# Patient Record
Sex: Female | Born: 1960 | Race: Black or African American | Hispanic: No | Marital: Married | Smoking: Never smoker
Health system: Southern US, Community
[De-identification: ages and names within clinical notes are randomized; demographics above are authoritative.]

## PROBLEM LIST (undated history)

## (undated) DIAGNOSIS — IMO0002 Reserved for concepts with insufficient information to code with codable children: Secondary | ICD-10-CM

## (undated) DIAGNOSIS — M797 Fibromyalgia: Secondary | ICD-10-CM

## (undated) DIAGNOSIS — F32A Depression, unspecified: Secondary | ICD-10-CM

## (undated) DIAGNOSIS — F419 Anxiety disorder, unspecified: Secondary | ICD-10-CM

## (undated) DIAGNOSIS — I219 Acute myocardial infarction, unspecified: Secondary | ICD-10-CM

## (undated) DIAGNOSIS — I10 Essential (primary) hypertension: Secondary | ICD-10-CM

## (undated) DIAGNOSIS — M329 Systemic lupus erythematosus, unspecified: Secondary | ICD-10-CM

## (undated) DIAGNOSIS — G629 Polyneuropathy, unspecified: Secondary | ICD-10-CM

## (undated) DIAGNOSIS — F329 Major depressive disorder, single episode, unspecified: Secondary | ICD-10-CM

## (undated) HISTORY — PX: UTERINE FIBROID SURGERY: SHX826

## (undated) HISTORY — PX: ABDOMINAL EXPLORATION SURGERY: SHX538

---

## 2006-12-22 ENCOUNTER — Ambulatory Visit: Payer: Self-pay | Admitting: Physical Medicine & Rehabilitation

## 2007-01-05 ENCOUNTER — Encounter: Admission: RE | Admit: 2007-01-05 | Discharge: 2007-01-05 | Payer: Self-pay | Admitting: Orthopedic Surgery

## 2007-01-18 ENCOUNTER — Encounter
Admission: RE | Admit: 2007-01-18 | Discharge: 2007-01-18 | Payer: Self-pay | Admitting: Physical Medicine & Rehabilitation

## 2007-02-02 ENCOUNTER — Ambulatory Visit: Payer: Self-pay | Admitting: Physical Medicine & Rehabilitation

## 2007-02-07 ENCOUNTER — Encounter
Admission: RE | Admit: 2007-02-07 | Discharge: 2007-02-07 | Payer: Self-pay | Admitting: Physical Medicine & Rehabilitation

## 2007-02-23 ENCOUNTER — Encounter
Admission: RE | Admit: 2007-02-23 | Discharge: 2007-02-23 | Payer: Self-pay | Admitting: Physical Medicine & Rehabilitation

## 2007-03-01 ENCOUNTER — Encounter
Admission: RE | Admit: 2007-03-01 | Discharge: 2007-03-01 | Payer: Self-pay | Admitting: Physical Medicine & Rehabilitation

## 2007-04-27 ENCOUNTER — Ambulatory Visit: Payer: Self-pay | Admitting: Physical Medicine & Rehabilitation

## 2007-05-02 ENCOUNTER — Ambulatory Visit: Payer: Self-pay | Admitting: Pulmonary Disease

## 2007-05-02 DIAGNOSIS — G4733 Obstructive sleep apnea (adult) (pediatric): Secondary | ICD-10-CM

## 2007-06-06 ENCOUNTER — Ambulatory Visit: Payer: Self-pay | Admitting: Cardiology

## 2007-06-13 ENCOUNTER — Encounter
Admission: RE | Admit: 2007-06-13 | Discharge: 2007-06-13 | Payer: Self-pay | Admitting: Physical Medicine & Rehabilitation

## 2007-06-15 ENCOUNTER — Ambulatory Visit: Payer: Self-pay | Admitting: Physical Medicine & Rehabilitation

## 2007-06-19 ENCOUNTER — Ambulatory Visit: Payer: Self-pay | Admitting: Cardiovascular Disease

## 2007-07-20 ENCOUNTER — Ambulatory Visit: Payer: Self-pay | Admitting: Physical Medicine & Rehabilitation

## 2007-08-31 ENCOUNTER — Ambulatory Visit: Payer: Self-pay | Admitting: Physical Medicine & Rehabilitation

## 2007-09-28 ENCOUNTER — Ambulatory Visit: Payer: Self-pay | Admitting: Physical Medicine & Rehabilitation

## 2007-10-31 ENCOUNTER — Ambulatory Visit: Payer: Self-pay

## 2007-11-02 ENCOUNTER — Ambulatory Visit: Payer: Self-pay | Admitting: Physical Medicine & Rehabilitation

## 2007-11-16 ENCOUNTER — Ambulatory Visit: Payer: Self-pay | Admitting: Physical Medicine & Rehabilitation

## 2008-04-28 DIAGNOSIS — K219 Gastro-esophageal reflux disease without esophagitis: Secondary | ICD-10-CM

## 2008-04-28 DIAGNOSIS — F329 Major depressive disorder, single episode, unspecified: Secondary | ICD-10-CM

## 2008-04-28 DIAGNOSIS — I10 Essential (primary) hypertension: Secondary | ICD-10-CM | POA: Insufficient documentation

## 2008-04-28 DIAGNOSIS — M1710 Unilateral primary osteoarthritis, unspecified knee: Secondary | ICD-10-CM | POA: Insufficient documentation

## 2008-04-28 DIAGNOSIS — J45909 Unspecified asthma, uncomplicated: Secondary | ICD-10-CM | POA: Insufficient documentation

## 2008-04-28 DIAGNOSIS — E119 Type 2 diabetes mellitus without complications: Secondary | ICD-10-CM

## 2008-04-28 DIAGNOSIS — M479 Spondylosis, unspecified: Secondary | ICD-10-CM | POA: Insufficient documentation

## 2008-04-28 DIAGNOSIS — IMO0001 Reserved for inherently not codable concepts without codable children: Secondary | ICD-10-CM | POA: Insufficient documentation

## 2010-09-07 NOTE — Assessment & Plan Note (Signed)
Judy English returns today.  She is a 50 year old female with  fibromyalgia syndrome, severe bilateral knee osteoarthritis, and morbid  obesity.  She is not a surgical candidate in terms of her knees.  She  has paresthesias in arms.  EMG is showing no neuropathy.  Cervical MRI  shows no signs of significant foraminal stenosis.  She is trying to  loose weight and has seen a dietitian.  We did switch her from Neurontin  to Topamax, in terms of pain relief, about the same.  She has not lost  any weight on this, which was hoping to be a beneficial side effect.   Her pain level is about 9/10, feels all over her body.  Her entire pain  diagram is filled in.  Pain is consistent throughout the day.  Relief  from meds is listed as good.  She uses a cane all the time, sometimes  uses a walker.  She cannot walk very far distances, although she does  make it from the parking lot to the office here, which is over 100 feet.   REVIEW OF SYSTEMS:  Basically, positive in the GI, neuropsych, and  cardiorespiratory realms.  Please see Health and History form.   PHYSICAL EXAMINATION:  Blood pressure 166/97; she states that she will  go see her primary and get it rechecked.  Pulse 95 and weight 323  pounds.  Mood and affect are appropriate.  She has 18/18 fibromyalgic  tender points.  Knee showed no evidence of effusion.  Gait is slow.  She  has a valgus deformity, bilateral knees.  She has widened base of  support, slow waddling type gait.   IMPRESSION:  1. Fibromyalgia syndrome, fairly well controlled on current      medications.  We will increase her Topamax dosing to see if we can      impact on some of her fibromyalgia syndrome.  2. Continue hydrocodone 7.5/325 mg t.i.d.  3. I will see her back in 1 month, by that time she should be on 100      mg b.i.d. of the Topamax.      Erick Colace, M.D.  Electronically Signed     AEK/MedQ  D:  09/28/2007 10:30:59  T:  09/29/2007 00:40:44  Job  #:  [id number]   cc:   Seymour Bars, D.O.  [facility].  [address]  [place], [place] [address]

## 2010-09-07 NOTE — Assessment & Plan Note (Signed)
Ms. Boyko comes back today.  She is actually in good spirits.  She  did talk to Dr. Charlann Boxer who thought she needed to drop down to 250 pounds  in order to be considered for knee replacement.  Her current weight is  336.  She does note that she used to weigh about 400, and is under the  care of Dr. Larita Fife,  lost about 70 pounds with diet and diabetes control.   She was tried on increased doses of Lyrica, but this really did not  help, and she was dropped back down to b.i.d.   Urine drug screen was appropriate, no evidence of illicit drug use.   CURRENT MEDICATIONS:  1. Hydrocodone 7.5/500 one p.o. b.i.d.  She does not think this is      controlling her pain adequately.  2. Lyrica 150 b.i.d.   PHYSICAL EXAMINATION:  Morbidly obese female in no acute distress.  Her blood pressure is 132/73, pulse 91, weight 336 pounds.  She ambulates with valgus deformity bilateral knees using a cane.  Her back has no tenderness to palpation.  She has good strength in upper  and lower extremities.  She has crepitus bilateral knees.  The patient started complaining of hand numbness and tingling, right  hand greater than left.  She has a negative reverse Phalen's, negative  Tinel's, states that it wakes her up at night.   IMPRESSION:  1. Myofascial pain syndrome.  2. Osteoarthritis, bilateral knees.  3. Wrist pain, dysesthesias, paresthesias.   PLAN:  1. Will set her up for EMG bilateral upper extremities.  2. Increase hydrocodone 10/500 one p.o. b.i.d.  3. Continue Lyrica 150 b.i.d.      Erick Colace, M.D.  Electronically Signed     AEK/MedQ  D:  02/02/2007 12:05:00  T:  02/02/2007 18:36:00  Job #:  [id number]

## 2010-09-07 NOTE — Assessment & Plan Note (Signed)
HISTORY OF PRESENT ILLNESS:  A 50 year old female with fibromyalgia  syndrome as well as severe bilateral knee osteoarthritis and morbid  obesity.  She is not a surgical candidate in terms of her knee.  She has  paresthesias in her arms, but EMG showing no evidence of neuropathy.  Cervical spine MRI shows no evidence of significant foraminal stenosis.  She has had no new changes in the interval time other than gaining 5  pounds in the last month.  She was switched from Neurontin to Topamax to  help with fibromyalgia pain with the hopes of getting the additional  benefit of appetite reduction; however, this has not been the case.  She  states that she has been quite busy after her son's graduation, more  active than usual; uses a cane to ambulate.  Sometimes, uses a walker on  bad day.  She needs assistance with dressing, bathing, meal preparation,  possible duties, and shopping.   REVIEW OF SYSTEMS:  Positive for trouble walking, spasms, dizziness,  confusion, depression, anxiety, numbness, weakness, weight gain, night  sweats, blood sugar problems, diarrhea alternating with constipation,  nausea, shortness of breath, and wheezing.  She just saw her family  physician yesterday.   SOCIAL HISTORY:  Single, lives with her children.  Denies any drug or  alcohol abuse.  There has been no signs of aberrant drug behavior.   PHYSICAL EXAMINATION:  VITAL SIGNS:  Her blood pressure is 166/97, pulse  95, weight 323 pounds.  GENERAL:  No acute distress.  Mood and affect appropriate.  NEUROLOGIC:  Gait shows wide-based gait; valgus deformity bilateral  knees.  Motor strength is 5/5 bilateral lower extremities.  She has  tenderness; fibromyalgia tender points, bilateral low back, hip, elbow,  and knee areas.  No areas in the neck or upper trapezius.   IMPRESSION:  1. Fibromyalgia syndrome.  I believe that she was better-controlled on      the Neurontin 600 t.i.d. than on the Topamax.  Certainly,  she has      had weight gain rather than weight loss, so I do not think there is      any additional benefit of the Topamax in that regard.  2. Bilateral knee degenerative joint disease, exacerbated by her      morbid obesity.  3. Does have an appointment with nutritional counseling.  We will send      her to physical therapy in terms of knee strengthening exercises,      fitness focus program for visits.  4. Cervical spondylosis without evidence of radiculopathy.   I will see her back in about 6 weeks.  She already has some hydrocodone  that should last for at least a couple more weeks, and then we will  renew via fax, and I have written new prescription for Neurontin 600  t.i.d. that she should substitute for the Topamax.      Erick Colace, M.D.  Electronically Signed     AEK/MedQ  D:  11/02/2007 09:59:01  T:  11/03/2007 09:37:27  Job #:  [id number]   cc:   Cyndi Lennert

## 2010-09-07 NOTE — Assessment & Plan Note (Signed)
Judy English follows up today.  I last saw her approximately 2 weeks  ago. She has had some increased problems since that time.  She complains  of diarrhea, some stomach cramps, flu-like feeling, feeling congested,  as well as pain all over the body as well as headache.  She states she  has seen her primary physician and states no new treatment was  initiated.   CURRENT MEDICATIONS:  Current medications that she obtains from my  office hydrocodone 7.5/325 t.i.d. and Neurontin 600 mg t.i.d.   The patient states she is sleeping poorly.  She walks 2 to 3 minutes at  a time.  She uses cane.  Her Oswestry index is 82, which, based on  scoring, is considered either bed-bound or exaggerating symptoms.   REVIEW OF SYSTEMS:  Otherwise negative.  She states that she has a lot  on her mind that she goes to the [employer] for mental health  services.  She declined to elaborate more on this for me.   PHYSICAL EXAMINATION:  GENERAL:  Appears tired, but otherwise in no  acute distress.  Affect is flat.  Gait is wide-based due to body  habitus, some valgus deformity at the knees.  Coordination appears  normal.  EXTREMITIES:  Deep tendon reflexes are normal, sensation is normal, and  motor strength is normal in bilateral upper and lower extremities.  She  has no evidence of peripheral edema.  NECK:  Range of motion is good.  No signs of meningismus.   IMPRESSION:  History of fibromyalgia with all over body pain,  complicated by depression.  She has mild cervical spondylosis, but no  compressive lesions.  She has no focal or neurologic signs to indicate  any CNS-type problem.  I think this mainly represents her fibromyalgia  with some overlying depression and we will increase her Neurontin to 800  t.i.d.   We will continue her hydrocodone 7.5 t.i.d.  I have asked her to follow  with [facility] to see if medication adjustments might be in order.  Check urine drug screen and make sure she  is taking her meds  appropriately.  It is possible that some of this could be that she took  her medicines at a more frequent dosing pattern and she may actually be  in withdrawal for the last few days.  We should be see those on urine  screen.   I also asked her to follow up with Dr. Jaynie Collins in regards to her diarrhea,  stomach cramping, as well as congestion.      Erick Colace, M.D.  Electronically Signed     AEK/MedQ  D:  11/16/2007 11:30:46  T:  11/17/2007 02:30:22  Job #:  [id number]   cc:   Judy Creek, DO

## 2010-09-07 NOTE — Assessment & Plan Note (Signed)
Ms. Hottenstein returns today. She has fibromyalgia with mini OA. She has  been seen by orthopedic and is not a surgical candidate. She has had an  EMG test back on 02/16/2007 demonstrating some chronic neurogenic  process, multilevel increased polyphasics, left APB first DI biceps,  triceps, deltoid. No other abnormalities on EMG testing to suggest media  nor ulnar neuropathy. No evidence of polyneuropathy.   She needs specific meal prep, household duties. She has some confusion,  depression, anxiety, and no suicidal thoughts.   Mood and affect are appropriate.   PHYSICAL EXAMINATION:  VITAL SIGNS:  Weight 336 pounds.  GENERAL:  She had tenderness in all 18 of 18 fibromyalgia tender points.  She has some knee joint line tenderness. She has normal deep tendon  reflexes. No strength deficits in upper or lower extremities.   IMPRESSION:  1. Fibromyalgia syndrome.  2. Polyphagia, fairly diffuse in the left upper extremity, but no      other findings. No signs of myelopathy. She may benefit from an MRI      test to see whether she has some cervical stenosis, but it is      difficult to say how this relates to her current complaints as      well.   I will follow up with her, continue her current medications.   Her medications include:  1. Neurontin 300 mg work up to t.i.d.  2. I stopped her Lyrica 150 due to lack of efficacy.  3. Lidoderm patch her back and bilateral knees.   I will see her back to see how she is doing with her medications.      Erick Colace, M.D.  Electronically Signed     AEK/MedQ  D:  02/23/2007 12:13:44  T:  02/24/2007 01:03:14  Job #:  [id number]

## 2010-09-07 NOTE — Assessment & Plan Note (Signed)
DATE OF SERVICE:  04/27/07   HISTORY:  This is a 50 year old female with whole body pain, onset about  16 months ago, history of major depression versus bipolar disorder seen  at the [facility] both by the psychologist as well as her  psychiatrist, Dr. Tresa Endo.   She has a history of lumbar pain.  A lumbar spine film from about a year  ago shows displaced narrowing at L5-S1.  She has cervical pain as well  and spondylosis is noted at C4-5 through C6-7 area.  No focal disc  herniations noted.  She has had some relief with trigger point injection  to the upper trapezius.  She has had minimally elevated sed rate,  however, rheumatoid factor, urate and ANA were negative.   Her pain levels have been a bit elevated recently.  It feels like the  Hydrocodone at twice a day doesn't cover throughout the day.  Her pain  is 9/10, sleep is poor.  Pain is relieved by rest, heat, medications,  TENS, and trigger point injections.  Mobility - Uses a cane, sometimes  uses a wheelchair in stores.   REVIEW OF SYSTEMS:  Functionally, she needs some assistance with  dressing, bathing, meal prep, household duties, shopping.  Fourteen  point review of systems done as well. Pertinent positives:  Depression/anxiety.   PHYSICAL EXAMINATION:  VITAL SIGNS:  Blood pressure 138/82, pulse 99,  weight 325 pounds which is actually down compared to the prior of 332 on  12/05.  Despite the holidays, I did congratulate her on this.  EXTREMITIES:  Her knees have crepitus but good range of motion.  Her hip  range of motion is good.  Good ankle range of motion.  Lower extremity  strength is normal.  Deep tendon reflexes are normal in the lower  extremities.  She has some tightness in the right upper trap but no  tenderness in the neck or parascapular area.  Mild tenderness on  palpation in the lumbar paraspinal's.   IMPRESSION:  1. Bilateral knee degenerative joint disease.  2. Cervical spondylosis.  3. All over  body pain .  Does not meet criteria for fibromyalgia      syndrome.  She has myofascial pain syndrome effecting mainly the      paraspinal, cervical area, trapezius area.  This is in conjunction      with her cervical spondylosis as well.   PLAN:  1. Will hold off on trigger point injection.  2. Will reduce her Hydrocodone from 10 mg to 7.5 but dose it three      times a day.  3. Increase Neurontin to 400 three times a day.  4. I will see her back in 2 months.  Continue home exercise program      until then.      Erick Colace, M.D.  Electronically Signed     AEK/MedQ  D:  04/27/2007 10:15:10  T:  04/27/2007 10:37:04  Job #:  [id number]   cc:   Dr. Tresa Endo

## 2010-09-07 NOTE — Assessment & Plan Note (Signed)
Judy English returns today.  She has a history of fibromyalgia syndrome  with bilateral knee osteoarthritis.  She is not a surgical candidate for  orthopedics.  Her chief complaint today is bilateral hand pain.  Denies  any numbness or tingling.  Has had EMG of the upper extremities  demonstrating no evidence of ulnar or medial neuropathy.  There were  some signs of chronic radiculopathy.  However, her cervical spine MRI  done lat fall as well, did not demonstrate any significant foraminal  compression.   She has what she terms as swelling in the hands.  I have never observed  this.  She has had rheumatologic blood work with rheumatoid factor of  20, which is normal, ANA, which is negative, urate, which was normal,  but a minimally elevated sed rate at 40.  She is having an evaluation  with rheumatology per her report.   On the positive side, she continues to lose weight.  She has gone down  from a high of 345 pounds, which was measured last fall, and is now down  to 329 pounds.  She has been going to [employer] here in  [place].   Her other complaint today is all over body pain, basically sparing the  chest and the abdomen, as well as the posterior head, but really the  rest of her body is shaded in.  Her average pain is 9/10.  Her pain is  constant, interferes with activity at a 10/10 level.  Pain is worse with  basically every activity.  Improves with rest, medication and TENS.   REVIEW OF SYSTEMS:  14 point review.  Pertinent positives include  depression, anxiety.  She has sleep problems as well.   Her blood pressure is 149/94, pulse 106, weight 329 pounds.  Her gait is  using a cane.  She has valgus deformity bilateral knees, slow  ambulation.   Palpation of fibromyalgia tender points is negative.  Her knees have  crepitus, but good range of motion.  Hip range of motion is good.  Ankle  range of motion is good.  Lower extremity and upper extremity  strength  is normal.  She has normal deep tendon reflexes in the biceps,  brachioradialis.  Triceps difficult to elicit secondary to adipose  tissue in the posterior arm.  Her neck range of motion is good.  She has  negative Spurling's maneuver.  Her sensation is intact.  Her reverse  Phalen's is negative.  She a negative Finkelstein's maneuver and no pain  with stress of the carpal metacarpal joint.   IMPRESSION:  1. All over body pain.  Most likely represents fibromyalgia syndrome,      although she does not have classical physical exam findings of 11      out of 18 tender points.  This may also account for the hand pain.      She is undergoing further rheumatologic evaluation and I would like      a copy of that report as well.  2. Bilateral knee degenerative disk disease.  3. Cervical spondylosis without clinical evidence of radiculopathy.   PLAN:  1. Continue hydrocodone 7.5 t.i.d.  2. Increase Neurontin to 600 t.i.d.  She did fail la previous effort      at Lyrica and Neurontin has been used as well      in clinical studies demonstrating benefit for fibromyalgia      patients.  3. I will see her back in one month's  time.      Erick Colace, M.D.  Electronically Signed     AEK/MedQ  D:  06/15/2007 10:59:16  T:  06/15/2007 21:43:53  Job #:  [id number]   cc:   Sherry Limb, Dr.

## 2010-09-07 NOTE — Assessment & Plan Note (Signed)
[facility]                            CARDIOLOGY OFFICE NOTE   LAEKYN, RAYOS                     MRN:          [id number]  DATE:06/06/2007                            DOB:          04-17-1961    Mrs. Raneri was a 50 year old female with past medical history of  diabetes mellitus, hypertension, fibromyalgia, gastroesophageal reflux  disease who presents as a new patient for evaluation of chest pain and  blood pressure issues.  She has no prior cardiac history.  For the past  several years she has had intermittent chest pain.  The pain is  substernal, described as a pressure.  It does not radiate.  It  can  increase with inspiration and lying flat.  It is not clearly exertional.  It is not related to food.  It typically lasts several minutes and  resolves spontaneously.  There is no associated nausea, vomiting or  diaphoresis, but there is shortness of breath.  She also states that her  diastolic blood pressure has been running between 90 and 100.  Because  of the above we were asked to further evaluate.  Note she does have some  dyspnea on exertion but there is no orthopnea, PND and has no pedal  edema.   MEDICATIONS:  1. Metformin 500 mg tablets two p.o. b.i.d.  2. Exforge 5/160 daily.  3. Prozac 80 mg p.o. b.i.d.  4. Lasix 20 mg daily.  5. Potassium 20 mEq daily.  6. Aspirin 325 mg daily.  7. Nexium 80 mg daily.  8. Vicodin.  9. Xanax.  10.Flonase.  11.CPAP.  12.Zyrtec.  13.Cortisone shots.  14.TENS device.  15.Neurontin 400 mg p.o. daily.  16.Iron.  17.Amaryl 4 mg daily.  18.Albuterol.  19.Home oxygen.   ALLERGIES:  NO KNOWN DRUG ALLERGIES.   SOCIAL HISTORY:  She does not smoke nor does she consume alcohol.   FAMILY HISTORY:  Her family history is positive coronary disease in her  mother.   PAST MEDICAL HISTORY:  1. Diabetes mellitus for approximately 3 years.  2. She also has hypertension.  3. She has a history of  asthma as well as obstructive sleep apnea.  4. She has a history of gastroesophageal reflux disease.  5. Fibromyalgia.  6. Prior C-section.  7. She also had fibroids removed.  8. There is a history of anxiety/depression by her report.   REVIEW OF SYSTEMS:  She denies any headaches at present.  She does state  that she occasionally has chills.  There is no fevers at present.  There  is no productive cough or hemoptysis.  There is no dysphagia,  odynophagia or hematochezia.  She does have dark bowel movements due to  iron by report.  There is no dysuria or hematuria.  There are no rashes  or seizure activity.  There is no orthopnea, PND or pedal edema.  Remaining systems are negative.  As she does state that she has some  diffuse aches from fibromyalgia.   PHYSICAL EXAMINATION:  VITAL SIGNS:  Shows a blood pressure 128/90 and  pulse is 106.  She weighs 328 pounds.  GENERAL:  She is well-developed and morbidly obese.  She is no acute  distress at present.  SKIN:  Warm and dry.  She is not depressed.  There is no peripheral  clubbing.  BACK:  Normal.  HEENT:  Normal with normal eyelids.  NECK:  Supple with normal upstroke bilaterally.  No bruits noted.  There  is no jugular distention and no thyromegaly.  CHEST:  Clear to auscultation.  No expansion.  CARDIOVASCULAR:  Regular rhythm.  Normal S1 and S2.  There is a soft 1/6  systolic ejection murmur at the left sternal border.  There is no S3 or  S4.  ABDOMEN:  Abdominal exam is difficult due to her obesity.  However,  there is no tenderness to palpation.  I cannot appreciate  hepatosplenomegaly or masses.  There is no bruit noted.  Her femoral  pulses are difficult to palpate due to her size.  There are no bruits  noted.  EXTREMITIES:  Show no edema.  I could palpate no cords.  She has 2+  dorsalis pedis pulses bilaterally.  NEUROLOGIC:  Grossly intact.   Electrocardiogram shows a sinus rhythm at a rate of 103.  There is  minimal  criteria for LVH.  There are no significant ST changes.   DIAGNOSIS:  1. Atypical chest pain.  Mrs. Adames's symptoms are atypical.  She      does have multiple risk factors and she is extremely concerned      about her symptoms.  I will plan to proceed with an adenosine      Myoview.  If there are no abnormalities, we will not pursue further      cardiac evaluation.  2. Hypertension.  Her diastolic blood pressure is elevated and she      states it is running in the 90-100 range at home.  I have asked her      to increase her Exforge to 10/160 daily.  We will track this and      adjust as indicated.  3. Diabetes mellitus.  This will be managed per primary care      physician.  Note, given that this is a coronary artery disease      equivalent, we will begin Zocor 40 mg daily.  Then we will check      lipids and liver in 6 weeks and adjust as indicated.  At that time      we will also check a BMET given her diuretic use and ARB inhibitor      use.  4. Fibromyalgia.  Per primary care physician.  5. History of asthma.  6. Gastroesophageal reflux disease.   I will have her return in 8 weeks to review the above information.     Madolyn Frieze Jens Som, MD, [facility]  Electronically Signed    BSC/MedQ  DD: 06/06/2007  DT: 06/08/2007  Job #: [id number]   cc:   Dr. Selena Batten

## 2010-09-10 NOTE — Procedures (Signed)
NAMEMarland English  SELINE, ENZOR            ACCOUNT NO.:  [id number]   MEDICAL RECORD NO.:  [id number]           PATIENT TYPE:   LOCATION:                                 FACILITY:   PHYSICIAN:  Erick Colace, M.D.DATE OF BIRTH:  August 09, 1960   DATE OF PROCEDURE:  DATE OF DISCHARGE:                               OPERATIVE REPORT   This is a trigger point injection, bilateral upper trapezius, one site  each.   INDICATIONS:  Cervical myofascial pain only partially responsive to  medication management and other conservative care.   Areas marked and prepped with Betadine.  Midclavicular line, upper trap  entered with 25-gauge inch and a half needle.  Marcaine 0.25% 0.5 mL was  injected in each site, followed by trigger point deactivation, positive  twitch sign.  The patient tolerated the procedure well.  Post injection  instructions given.  I will see her back in 1 month.      Erick Colace, M.D.  Electronically Signed     AEK/MEDQ  D:  03/02/2007 09:13:49  T:  03/02/2007 15:04:41  Job:  [id number]

## 2010-09-10 NOTE — Assessment & Plan Note (Signed)
This is a followup visit for neck pain and going to go over the MRI  results.  The patient was last seen by me, had an EMG, February 16, 2007,  showing some polyphagia, reduced recruitment, multiple left upper  extremity muscle groups, but no other EMG abnormalities noted.   She has no new changes in her medical status.  She still has some  numbness and tingling in the arms, depression, anxiety, no suicidal  thoughts, positive shortness of breath, sleep apnea, uses CPAP, has some  cough, nausea, and blood sugar problems.   PHYSICAL EXAMINATION:  Weight 334 pounds.  GENERAL:  No acute distress.  Mood and affect appropriate.  NECK:  Tenderness to palpation bilateral upper traps, otherwise has  reduced range of motion 50% to forward flexion, extension, lateral  rotation and bending.  Grip is normal bilaterally.  Sensation is reduced bilateral fingers, but  not in a dermatomal fashion.  Deep tendon reflexes are 2+ bilateral  biceps, triceps, brachioradialis, knee and ankle.   Review of MRI shows C4-C5, C5-C6, C6-C7 disk bulge which reduces the  anterior CSF space, but does not impinge upon the cord.  I do not see  any definite signs of foraminal stenosis, and facets show no evidence of  arthropathy.   IMPRESSION:  Cervical spondylosis as well as myofascial pain syndrome  causing chronic neck and trapezius pain.   PLAN:  Will continue current medications which include hydrocodone  10/325 b.i.d., Neurontin 300 t.i.d. for upper extremity symptoms, as  well as do trigger point injections today.  I will see her back in 1  month.      Erick Colace, M.D.  Electronically Signed     AEK/MedQ  D:  03/02/2007 09:16:31  T:  03/02/2007 13:20:58  Job #:  [id number]

## 2010-09-10 NOTE — Procedures (Signed)
NAME:  Judy English, Judy English            ACCOUNT NO.:  [id number]   MEDICAL RECORD NO.:  [id number]          PATIENT TYPE:  REC   LOCATION:  [facility]                         FACILITY:  [facility]   PHYSICIAN:  Erick Colace, M.D.DATE OF BIRTH:  November 17, 1960   DATE OF PROCEDURE:  DATE OF DISCHARGE:  01/18/2007                               OPERATIVE REPORT   PROCEDURE:  Trigger point junction, bilateral upper traps.   INDICATIONS:  Cervical myofascial pain only partially responsive to  medication management and other conservative care.   The area was marked and prepped with Betadine.  The midclavicular line  upper trap entered with a 25-gauge inch and a half needle. Lidocaine 1%  x 1 mL injected at each site followed by trigger point deactivation.  Positive twitch sign was noted on the left side.  The patient tolerated  the procedure well.  Post injection instructions were given. I will see  her back in 1 month.      Erick Colace, M.D.  Electronically Signed     AEK/MEDQ  D:  03/30/2007 10:04:39  T:  03/30/2007 12:14:14  Job:  [id number]

## 2010-09-10 NOTE — Assessment & Plan Note (Signed)
HISTORY:  A 50 year old female with whole body pain, onset about 15  months ago.  She had some psychiatric issues at the time.  Diagnosed  with major depression versus bipolar disorder.  She had a lumbar spine  film November 24, 2005 showing disk space narrowing at L5-S1.  No pars  defects.  She is showing some possible diabetic neuropathy.  She had a  minimally elevated rheumatoid factor on November 10, 2006.  Sed rate  minimally elevated at 32.  She has had diagnosis of myofascial pain  syndrome and osteoarthritis of the left knee, evaluated by orthopedics.  Weight loss was recommended.  She has a history of diabetes mellitus.  She had an EMG showing some polyphagia consistent with some chronic  denervation at multiple levels.  No evidence of entrapment neuropathy in  the upper extremities.  She has pain in the hands that limits her from  doing some fine motor activities.  She has had some good relief with  trigger point injections bilateral upper traps last visit.   CURRENT MEDICATIONS:  1. Hydrocodone 10/325 b.i.d.  2. Neurontin 300 t.i.d.   PAST MEDICAL HISTORY:  1. Obstructive sleep apnea, uses CPAP, but has not seen a sleep doctor      for about a year.  In fact, is looking to see somebody else for      this.  2. Pain levels are greater than 9/10 interfering with ADLs.  3. She has confusion, depression, anxiety.  No suicidal thoughts.  4. Some numbness and tingling in the lower extremities.  5. Had some blood sugar management problems per her review of systems      as well.   PHYSICAL EXAMINATION:  VITAL SIGNS:  Weight 332 pounds, blood pressure  165/97, pulse 89.  GENERAL:  No acute distress.  Mood and affect are appropriate.  She has  tenderness to bilateral upper traps.  NECK:  Range of motion 50% forward flexion and extension, lateral  rotation and bending.  EXTREMITIES:  Grip is normal bilaterally.  Sensation reduced in  bilateral fingers, but not in a dermatomal fashion.   She has some  tenderness over the MCPs bilaterally, but no swelling.  Lower extremity  strength is normal in the hip flexion, knee extension, ankle and  dorsiflexors.  She has valgus deformity to bilateral knees.   IMPRESSION:  1. Cervical spondylosis as well as some myofascial pain syndrome      causing chronic neck and trapezius pain.  2. Knee osteoarthritis.  3. Hand pain.  Had minimal elevation of sed rate and rheumatoid      factor.  We will repeat.  Does not appear to be a neurogenic      process in terms of her hands.   PLAN:  We will do trigger point injection today.      Erick Colace, M.D.  Electronically Signed     AEK/MedQ  D:  03/30/2007 10:03:33  T:  03/30/2007 12:10:42  Job #:  [id number]   cc:   Barbaraann Share, MD,FCCP  [address]  [place]  [place] [address]   Mal Misty, MD

## 2010-09-10 NOTE — Assessment & Plan Note (Signed)
Judy English returns today.  She is a 50 year old female with  fibromyalgia syndrome, bilateral knee osteoarthritis and morbid obesity.  She is not a surgical candidate in terms of her knees.  She has had  paresthesias in her arms but EMG showed no evidence of ulnar or median  neuropathy.  There are some signs of chronic radiculopathy.  However,  the cervical spine MRI done last Fall did not show any significant  foraminal stenosis.   She has had no new medical problems in interval time.   Her pain is about 9/10 but interferes with activity only at a 1/10  level, she has some difficulties with meal prep, household duties and  shopping but is independent with all her self-care, she uses a cane.   REVIEW OF SYSTEMS:  Is as per Health & History Form.  She has pertinent  positive of depression, anxiety but negative for suicidal thoughts.   Her blood pressure is 144/89, pulse 88, weight 326 pounds, which is  actually down 3 pounds compared to last month.   EXAMINATION:  GENERAL:  No acute distress.  Palpation of fibromyalgia  tender points is negative in all 18/18.  Her gait shows valgus deformity bilateral knees.  She has crepitus  bilateral knees, good range of motion at the knees, hips and ankles.  Her upper and lower extremity strength is normal.  She has normal deep  tendon reflexes in upper and lower extremities, although the triceps  difficult to elicit secondary to adipose tissue.   IMPRESSION:  1. Fibromyalgia syndrome fairly well controlled at this point with      Neurontin 600 t.i.d.  2. Bilateral knee degenerative joint disease.  3. Cervical spondylosis without evidence of radiculopathy at this      point.   PLAN:  1. Continue hydrocodone 7.5/325 t.i.d.  2. Continue Neurontin 600 t.i.d.  3. She is quite happy with her medications at this point.  We did      discuss Topamax as medication that may help with neurogenic type      pain but may have the added benefit of  reducing appetite, will      consider this in the future should she stall out on her weight loss      program.      Erick Colace, M.D.  Electronically Signed     AEK/MedQ  D:  07/20/2007 16:08:12  T:  07/20/2007 18:52:49  Job #:  [id number]

## 2011-04-02 ENCOUNTER — Other Ambulatory Visit: Payer: Self-pay

## 2011-04-02 ENCOUNTER — Emergency Department (HOSPITAL_BASED_OUTPATIENT_CLINIC_OR_DEPARTMENT_OTHER)
Admission: EM | Admit: 2011-04-02 | Discharge: 2011-04-03 | Disposition: A | Discharge status: Home / Self Care | Payer: Medicaid Other | Attending: Emergency Medicine | Admitting: Emergency Medicine

## 2011-04-02 ENCOUNTER — Encounter: Payer: Self-pay | Admitting: *Deleted

## 2011-04-02 DIAGNOSIS — E119 Type 2 diabetes mellitus without complications: Secondary | ICD-10-CM | POA: Insufficient documentation

## 2011-04-02 DIAGNOSIS — I10 Essential (primary) hypertension: Secondary | ICD-10-CM | POA: Insufficient documentation

## 2011-04-02 DIAGNOSIS — M25519 Pain in unspecified shoulder: Secondary | ICD-10-CM | POA: Insufficient documentation

## 2011-04-02 DIAGNOSIS — B349 Viral infection, unspecified: Secondary | ICD-10-CM

## 2011-04-02 DIAGNOSIS — B9789 Other viral agents as the cause of diseases classified elsewhere: Secondary | ICD-10-CM | POA: Insufficient documentation

## 2011-04-02 HISTORY — DX: Essential (primary) hypertension: I10

## 2011-04-02 HISTORY — DX: Major depressive disorder, single episode, unspecified: F32.9

## 2011-04-02 HISTORY — DX: Depression, unspecified: F32.A

## 2011-04-02 HISTORY — DX: Anxiety disorder, unspecified: F41.9

## 2011-04-02 HISTORY — DX: Fibromyalgia: M79.7

## 2011-04-02 MED ORDER — SODIUM CHLORIDE 0.9 % IV BOLUS (SEPSIS)
1000.0000 mL | Freq: Once | INTRAVENOUS | Status: AC
  Administered 2011-04-03: 1000 mL via INTRAVENOUS

## 2011-04-02 NOTE — ED Notes (Signed)
For two weeks has had pain in left arm, glucose control issues, high blood pressure for two weeks also. Now states she is having N/V/D since today. Seen PCP thur or friday and they have been working on her medications. Unable to recall what she is taking. States her vision is bad, urine darker than normal and doesn't feel good.

## 2011-04-03 ENCOUNTER — Emergency Department (INDEPENDENT_AMBULATORY_CARE_PROVIDER_SITE_OTHER): Payer: Medicaid Other

## 2011-04-03 DIAGNOSIS — M79609 Pain in unspecified limb: Secondary | ICD-10-CM

## 2011-04-03 DIAGNOSIS — R05 Cough: Secondary | ICD-10-CM

## 2011-04-03 LAB — COMPREHENSIVE METABOLIC PANEL
AST: 18 U/L (ref 0–37)
Albumin: 3.1 g/dL — ABNORMAL LOW (ref 3.5–5.2)
BUN: 10 mg/dL (ref 6–23)
Calcium: 8.2 mg/dL — ABNORMAL LOW (ref 8.4–10.5)
GFR calc non Af Amer: >90 mL/min (ref >90)
Glucose, Bld: 166 mg/dL — ABNORMAL HIGH (ref 70–99)
Total Protein: 7.4 g/dL (ref 6.0–8.3)

## 2011-04-03 LAB — DIFFERENTIAL
Basophils Absolute: 0 10*3/uL (ref 0.0–0.1)
Basophils Relative: 0 % (ref 0–1)
Eosinophils Relative: 1 % (ref 0–5)
Lymphocytes Relative: 26 % (ref 12–46)
Lymphs Abs: 1.8 10*3/uL (ref 0.7–4.0)
Monocytes Relative: 11 % (ref 3–12)
Neutro Abs: 4 10*3/uL (ref 1.7–7.7)
Neutrophils Relative %: 61 % (ref 43–77)

## 2011-04-03 LAB — URINE MICROSCOPIC-ADD ON

## 2011-04-03 LAB — CBC
MCH: 24.1 pg — ABNORMAL LOW (ref 26.0–34.0)
Platelets: 282 10*3/uL (ref 150–400)
RDW: 15.5 % (ref 11.5–15.5)

## 2011-04-03 LAB — URINALYSIS, ROUTINE W REFLEX MICROSCOPIC
Ketones, ur: NEGATIVE mg/dL
Leukocytes, UA: NEGATIVE
Nitrite: NEGATIVE

## 2011-04-03 LAB — GLUCOSE, CAPILLARY: Glucose-Capillary: 159 mg/dL — ABNORMAL HIGH (ref 70–99)

## 2011-04-03 MED ORDER — HYDROMORPHONE HCL PF 1 MG/ML IJ SOLN
1.0000 mg | Freq: Once | INTRAMUSCULAR | Status: AC
  Administered 2011-04-03: 1 mg via INTRAVENOUS
  Filled 2011-04-03: qty 1

## 2011-04-03 MED ORDER — ONDANSETRON HCL 4 MG/2ML IJ SOLN
4.0000 mg | Freq: Once | INTRAMUSCULAR | Status: AC
  Administered 2011-04-03: 4 mg via INTRAVENOUS
  Filled 2011-04-03: qty 2

## 2011-04-03 MED ORDER — ONDANSETRON HCL 4 MG PO TABS: 4.0000 mg | ORAL_TABLET | Freq: Four times a day (QID) | ORAL | Status: AC

## 2011-04-03 NOTE — ED Provider Notes (Signed)
History  This chart was scribed for Judy Nielsen, MD by Bennett Scrape. This patient was seen in room MH12/MH12.   CSN: [id number] Arrival date & time: 04/02/2011 11:19 PM   First MD Initiated Contact with Patient 04/02/11 2334      Chief Complaint  Patient presents with  . Arm Pain    Patient is a 50 y.o. female presenting with shoulder pain. The history is provided by the patient.  Shoulder Pain This is a recurrent problem. Episode onset: About 2 weeks ago. The problem occurs constantly. The problem has not changed since onset.Pertinent negatives include no chest pain, no abdominal pain, no headaches and no shortness of breath. Exacerbated by: Any kind of movement. The symptoms are relieved by nothing. Treatments tried: Taking narcotic pain medications prescribed by her physician. The treatment provided no relief.   Patient has multiple medical complaints. Her main concern tonight is her left shoulder which continues to bother her. Pain described as sharp. It is not radiating. Moderate in severity. No alleviating factors. She denies any associated symptoms with this pain. She is also concerned about her blood sugar has been elevated recently despite taking her home medications. She is also or are blood pressure that has been up and down. She has not had any issues with her asthma. Now for last 2-3 days she's been having some nausea and vomiting x2 today. Some loose stools but no diarrhea. No blood in emesis or stools. No bilious emesis. She denies abdominal pain. She feels like she's been having fevers but no measured temperature. No sweating or chills. No sick contacts. She has had some mild cough no productive sputum. She denies any wheezing.  Judy English is a 50 y.o. female who presents to the Emergency Department complaining of left shoulder pain, elevated blood sugars, high blood pressure and dark urine, and nausea and vomtinig.    Past Medical History  Diagnosis Date  .  Hypertension   . Diabetes mellitus   . Fibromyalgia   . Depression   . Asthma   . Anxiety     Past Surgical History  Procedure Date  . Uterine fibroid surgery   . Cesarean section     No family history on file.  History  Substance Use Topics  . Smoking status: Never Smoker   . Smokeless tobacco: Not on file  . Alcohol Use:     OB History    Grav Para Term Preterm Abortions TAB SAB Ect Mult Living                  Review of Systems  Constitutional: Negative for chills.  HENT: Negative for nosebleeds, sore throat, trouble swallowing, neck pain and neck stiffness.   Eyes: Negative for pain.  Respiratory: Positive for cough. Negative for shortness of breath and wheezing.   Cardiovascular: Negative for chest pain.  Gastrointestinal: Negative for abdominal pain and anal bleeding.  Genitourinary: Negative for dysuria and flank pain.  Musculoskeletal: Negative for back pain.  Skin: Negative for rash.  Neurological: Negative for headaches.  All other systems reviewed and are negative.   A complete 10 system review of systems was obtained and is otherwise negative except as noted in the HPI.   Allergies  Oxycodone-aspirin  Home Medications   Current Outpatient Rx  Name Route Sig Dispense Refill  . ALBUTEROL SULFATE HFA 108 (90 BASE) MCG/ACT IN AERS Inhalation Inhale 2 puffs into the lungs every 6 (six) hours as needed. For shortness of breath and  wheezing     . ALPRAZOLAM 1 MG PO TABS Oral Take 1 mg by mouth 3 (three) times daily as needed. For anxiety      . ASPIRIN 325 MG PO TBEC Oral Take 325 mg by mouth daily.      Brigitte Pulse XL PO Oral Take 1 tablet by mouth daily.      Marland Kitchen ESOMEPRAZOLE MAGNESIUM 40 MG PO CPDR Oral Take 40 mg by mouth daily before breakfast.      . FLUOXETINE HCL 40 MG PO CAPS Oral Take 80 mg by mouth daily.      Marland Kitchen FLUTICASONE-SALMETEROL 500-50 MCG/DOSE IN AEPB Inhalation Inhale 1 puff into the lungs every 12 (twelve) hours.      Marland Kitchen  GLIPIZIDE-METFORMIN HCL 5-500 MG PO TABS Oral Take 1 tablet by mouth daily.      . INSULIN GLARGINE 100 UNIT/ML [place] SOLN Subcutaneous Inject 20 Units into the skin at bedtime.      . MOMETASONE FUROATE 50 MCG/ACT NA SUSP Each Nare Place 2 sprays into both nostrils daily.      . OXYCODONE HCL 5 MG PO CAPS Oral Take 5 mg by mouth every 4 (four) hours as needed. For pain     . ZOLPIDEM TARTRATE 10 MG PO TABS Oral Take 10 mg by mouth at bedtime.        Triage Vitals: BP 152/77  Pulse 109  Temp(Src) 98.4 F (36.9 C) (Oral)  Resp 18  Ht 5\' 7"  (1.702 m)  Wt 330 lb (149.687 kg)  BMI 51.69 kg/m2  SpO2 100%  Physical Exam  Constitutional: She is oriented to person, place, and time. She appears well-developed and well-nourished.  HENT:  Head: Normocephalic and atraumatic.  Eyes: Conjunctivae and EOM are normal. Pupils are equal, round, and reactive to light.  Neck: Trachea normal. Neck supple. No thyromegaly present.  Cardiovascular: Normal rate, regular rhythm, S1 normal, S2 normal and normal pulses.     No systolic murmur is present   No diastolic murmur is present  Pulses:      Radial pulses are 2+ on the right side, and 2+ on the left side.  Pulmonary/Chest: Effort normal and breath sounds normal. She has no wheezes. She has no rhonchi. She has no rales. She exhibits no tenderness.  Abdominal: Soft. Normal appearance and bowel sounds are normal. There is no tenderness. There is no CVA tenderness and negative Murphy's sign.  Musculoskeletal:       Left shoulder tenderness palpation without any crepitus, erythema or swelling. Good range of motion without deformity. Distal neurovascular intact. Nontender clavicle, elbow and wrist.  BLE:s Calves nontender, no cords or erythema, negative Homans sign  Neurological: She is alert and oriented to person, place, and time. She has normal strength. No cranial nerve deficit or sensory deficit. GCS eye subscore is 4. GCS verbal subscore is 5. GCS motor  subscore is 6.  Skin: Skin is warm and dry. No rash noted. She is not diaphoretic.  Psychiatric: Her speech is normal.       Cooperative and appropriate    ED Course  Procedures (including critical care time)  DIAGNOSTIC STUDIES: Oxygen Saturation is 100% on room air, normal by my interpretation.       Results for orders placed during the hospital encounter of 04/02/11  CBC      Component Value Range   WBC 6.6  4.0 - 10.5 (K/uL)   RBC 4.19  3.87 - 5.11 (MIL/uL)   Hemoglobin  10.1 (*) 12.0 - 15.0 (g/dL)   HCT 16.1 (*) 09.6 - 46.0 (%)   MCV 73.3 (*) 78.0 - 100.0 (fL)   MCH 24.1 (*) 26.0 - 34.0 (pg)   MCHC 32.9  30.0 - 36.0 (g/dL)   RDW 04.5  40.9 - 81.1 (%)   Platelets 282  150 - 400 (K/uL)  DIFFERENTIAL      Component Value Range   Neutrophils Relative 61  43 - 77 (%)   Neutro Abs 4.0  1.7 - 7.7 (K/uL)   Lymphocytes Relative 26  12 - 46 (%)   Lymphs Abs 1.8  0.7 - 4.0 (K/uL)   Monocytes Relative 11  3 - 12 (%)   Monocytes Absolute 0.7  0.1 - 1.0 (K/uL)   Eosinophils Relative 1  0 - 5 (%)   Eosinophils Absolute 0.1  0.0 - 0.7 (K/uL)   Basophils Relative 0  0 - 1 (%)   Basophils Absolute 0.0  0.0 - 0.1 (K/uL)  COMPREHENSIVE METABOLIC PANEL      Component Value Range   Sodium 137  135 - 145 (mEq/L)   Potassium 3.5  3.5 - 5.1 (mEq/L)   Chloride 103  96 - 112 (mEq/L)   CO2 23  19 - 32 (mEq/L)   Glucose, Bld 166 (*) 70 - 99 (mg/dL)   BUN 10  6 - 23 (mg/dL)   Creatinine, Ser 9.14  0.50 - 1.10 (mg/dL)   Calcium 8.2 (*) 8.4 - 10.5 (mg/dL)   Total Protein 7.4  6.0 - 8.3 (g/dL)   Albumin 3.1 (*) 3.5 - 5.2 (g/dL)   AST 18  0 - 37 (U/L)   ALT 11  0 - 35 (U/L)   Alkaline Phosphatase 66  39 - 117 (U/L)   Total Bilirubin 0.2 (*) 0.3 - 1.2 (mg/dL)   GFR calc non Af Amer >90  >90 (mL/min)   GFR calc Af Amer >90  >90 (mL/min)  URINALYSIS, ROUTINE W REFLEX MICROSCOPIC      Component Value Range   Color, Urine YELLOW  YELLOW    APPearance CLEAR  CLEAR    Specific Gravity, Urine  1.017  1.005 - 1.030    pH 5.0  5.0 - 8.0    Glucose, UA NEGATIVE  NEGATIVE (mg/dL)   Hgb urine dipstick NEGATIVE  NEGATIVE    Bilirubin Urine NEGATIVE  NEGATIVE    Ketones, ur NEGATIVE  NEGATIVE (mg/dL)   Protein, ur 782 (*) NEGATIVE (mg/dL)   Urobilinogen, UA 1.0  0.0 - 1.0 (mg/dL)   Nitrite NEGATIVE  NEGATIVE    Leukocytes, UA NEGATIVE  NEGATIVE   GLUCOSE, CAPILLARY      Component Value Range   Glucose-Capillary 159 (*) 70 - 99 (mg/dL)  URINE MICROSCOPIC-ADD ON      Component Value Range   Squamous Epithelial / LPF RARE  RARE    WBC, UA 0-2  <3 (WBC/hpf)   RBC / HPF 0-2  <3 (RBC/hpf)   Bacteria, UA RARE  RARE    Dg Chest 2 View  04/03/2011  *RADIOLOGY REPORT*  Clinical Data: Cough.  Arm pain.  CHEST - 2 VIEW  Comparison: None.  Findings: The heart size and pulmonary vascularity are normal. The lungs appear clear and expanded without focal air space disease or consolidation. No blunting of the costophrenic angles.  No pneumothorax.  Degenerative changes in the spine.  IMPRESSION: No evidence of active pulmonary disease.  Original Report Authenticated By: Marlon Pel, M.D.  Date: 04/03/2011  Rate: 104  Rhythm: sinus tachycardia  QRS Axis: left  Intervals: normal  ST/T Wave abnormalities: nonspecific ST/T changes  Conduction Disutrbances:none  Narrative Interpretation:   Old EKG Reviewed: none available     MDM  Patient history of fibromyalgia, ongoing shoulder pain and multiple complaints today. She had workup as above including screening EKG for shoulder pain. Her symptoms do not suggest ACS.  No findings on exam to suggest indication for x-ray of shoulder this time. She may require MRI if she is to have persistent symptoms.  Chest x-ray obtained and reviewed for cough, there are no infiltrates and no pneumothorax. No UTI by review of urinalysis. Given body aches and cough multiple symptoms, this could represent viral infection. Patient felt better after given IV  Dilaudid also suggests this could be her fibromyalgia. She has oxycodone at home, agrees to take medications as prescribed and follow up with her physician. Reliable historian and agrees to strict return precautions. There is no indication for admission or further workup at this time   I personally performed the services described in this documentation, which was scribed in my presence. The recorded information has been reviewed and considered.    Judy Nielsen, MD 04/03/11 [phone]

## 2011-04-03 NOTE — ED Notes (Signed)
Patient transported to X-ray 

## 2011-04-03 NOTE — Discharge Instructions (Signed)
Rest and be sure to drink plenty of fluids. Take your medications as prescribed for shoulder pain as needed for any return of nausea. Call your doctor for followup in the clinic as discussed and return here for any worsening condition including fevers, abdominal pain or any concerning symptoms.   Viral Infections  A viral infection can be caused by different types of viruses. Most viral infections are not serious and resolve on their own. However, some infections may cause severe symptoms and may lead to further complications.  SYMPTOMS  Viruses can frequently cause:  Minor sore throat.  Aches and pains.  Headaches.  Runny nose.  Different types of rashes.  Watery eyes.  Tiredness.  Cough.  Loss of appetite.  Gastrointestinal infections, resulting in nausea, vomiting, and diarrhea.  These symptoms do not respond to antibiotics because the infection is not caused by bacteria. However, you might catch a bacterial infection following the viral infection. This is sometimes called a "superinfection." Symptoms of such a bacterial infection may include:  Worsening sore throat with pus and difficulty swallowing.  Swollen neck glands.  Chills and a high or persistent fever.  Severe headache.  Tenderness over the sinuses.  Persistent overall ill feeling (malaise), muscle aches, and tiredness (fatigue).  Persistent cough.  Yellow, green, or brown mucus production with coughing.  HOME CARE INSTRUCTIONS  Only take over-the-counter or prescription medicines for pain, discomfort, diarrhea, or fever as directed by your caregiver.  Drink enough water and fluids to keep your urine clear or pale yellow. Sports drinks can provide valuable electrolytes, sugars, and hydration.  Get plenty of rest and maintain proper nutrition. Soups and broths with crackers or rice are fine.  SEEK IMMEDIATE MEDICAL CARE IF:  You have severe headaches, shortness of breath, chest pain, neck pain, or an unusual rash.  You  have uncontrolled vomiting, diarrhea, or you are unable to keep down fluids.  You or your child has an oral temperature above 102 F (38.9 C), not controlled by medicine.  Your baby is older than 3 months with a rectal temperature of 102 F (38.9 C) or higher.  Your baby is 66 months old or younger with a rectal temperature of 100.4 F (38 C) or higher.  MAKE SURE YOU:  Understand these instructions.  Will watch your condition.  Will get help right away if you are not doing well or get worse.  Document Released: 01/19/2005 Document Revised: 12/22/2010 Document Reviewed: 08/16/2010  [facility] Patient Information 2012 [place], [place].

## 2011-12-22 ENCOUNTER — Other Ambulatory Visit (HOSPITAL_COMMUNITY)
Admission: RE | Admit: 2011-12-22 | Discharge: 2011-12-22 | Disposition: A | Discharge status: Home / Self Care | Payer: Medicaid Other | Source: Ambulatory Visit | Attending: Obstetrics and Gynecology | Admitting: Obstetrics and Gynecology

## 2011-12-22 ENCOUNTER — Other Ambulatory Visit: Payer: Self-pay | Admitting: Obstetrics and Gynecology

## 2011-12-22 DIAGNOSIS — Z113 Encounter for screening for infections with a predominantly sexual mode of transmission: Secondary | ICD-10-CM | POA: Insufficient documentation

## 2011-12-22 DIAGNOSIS — Z124 Encounter for screening for malignant neoplasm of cervix: Secondary | ICD-10-CM | POA: Insufficient documentation

## 2012-07-31 ENCOUNTER — Ambulatory Visit (HOSPITAL_BASED_OUTPATIENT_CLINIC_OR_DEPARTMENT_OTHER): Payer: Medicaid Other

## 2013-12-06 ENCOUNTER — Emergency Department (HOSPITAL_BASED_OUTPATIENT_CLINIC_OR_DEPARTMENT_OTHER): Payer: Medicaid Other

## 2013-12-06 ENCOUNTER — Emergency Department (HOSPITAL_BASED_OUTPATIENT_CLINIC_OR_DEPARTMENT_OTHER)
Admission: EM | Admit: 2013-12-06 | Discharge: 2013-12-06 | Disposition: A | Discharge status: Home / Self Care | Payer: Medicaid Other | Attending: Emergency Medicine | Admitting: Emergency Medicine

## 2013-12-06 ENCOUNTER — Encounter (HOSPITAL_BASED_OUTPATIENT_CLINIC_OR_DEPARTMENT_OTHER): Payer: Self-pay | Admitting: Emergency Medicine

## 2013-12-06 DIAGNOSIS — I10 Essential (primary) hypertension: Secondary | ICD-10-CM | POA: Diagnosis not present

## 2013-12-06 DIAGNOSIS — Z794 Long term (current) use of insulin: Secondary | ICD-10-CM | POA: Diagnosis not present

## 2013-12-06 DIAGNOSIS — J189 Pneumonia, unspecified organism: Secondary | ICD-10-CM

## 2013-12-06 DIAGNOSIS — N898 Other specified noninflammatory disorders of vagina: Secondary | ICD-10-CM | POA: Diagnosis not present

## 2013-12-06 DIAGNOSIS — J45909 Unspecified asthma, uncomplicated: Secondary | ICD-10-CM | POA: Diagnosis not present

## 2013-12-06 DIAGNOSIS — Z79899 Other long term (current) drug therapy: Secondary | ICD-10-CM | POA: Insufficient documentation

## 2013-12-06 DIAGNOSIS — Z7982 Long term (current) use of aspirin: Secondary | ICD-10-CM | POA: Insufficient documentation

## 2013-12-06 DIAGNOSIS — R079 Chest pain, unspecified: Secondary | ICD-10-CM | POA: Insufficient documentation

## 2013-12-06 DIAGNOSIS — R0602 Shortness of breath: Secondary | ICD-10-CM

## 2013-12-06 DIAGNOSIS — N939 Abnormal uterine and vaginal bleeding, unspecified: Secondary | ICD-10-CM

## 2013-12-06 DIAGNOSIS — F411 Generalized anxiety disorder: Secondary | ICD-10-CM | POA: Insufficient documentation

## 2013-12-06 DIAGNOSIS — F3289 Other specified depressive episodes: Secondary | ICD-10-CM | POA: Insufficient documentation

## 2013-12-06 DIAGNOSIS — E119 Type 2 diabetes mellitus without complications: Secondary | ICD-10-CM | POA: Diagnosis not present

## 2013-12-06 DIAGNOSIS — IMO0001 Reserved for inherently not codable concepts without codable children: Secondary | ICD-10-CM | POA: Insufficient documentation

## 2013-12-06 DIAGNOSIS — J159 Unspecified bacterial pneumonia: Secondary | ICD-10-CM | POA: Diagnosis not present

## 2013-12-06 DIAGNOSIS — F329 Major depressive disorder, single episode, unspecified: Secondary | ICD-10-CM | POA: Diagnosis not present

## 2013-12-06 LAB — CBC
HCT: 33.4 % — ABNORMAL LOW (ref 36.0–46.0)
Hemoglobin: 11 g/dL — ABNORMAL LOW (ref 12.0–15.0)
MCH: 25.4 pg — ABNORMAL LOW (ref 26.0–34.0)
MCHC: 32.9 g/dL (ref 30.0–36.0)
MCV: 77.1 fL — ABNORMAL LOW (ref 78.0–100.0)
PLATELETS: 308 10*3/uL (ref 150–400)
RBC: 4.33 MIL/uL (ref 3.87–5.11)
RDW: 15.3 % (ref 11.5–15.5)
WBC: 8.1 10*3/uL (ref 4.0–10.5)

## 2013-12-06 LAB — BASIC METABOLIC PANEL
Anion gap: 15 (ref 5–15)
BUN: 11 mg/dL (ref 6–23)
CHLORIDE: 99 meq/L (ref 96–112)
CO2: 23 mEq/L (ref 19–32)
Calcium: 8.9 mg/dL (ref 8.4–10.5)
Creatinine, Ser: 0.7 mg/dL (ref 0.50–1.10)
GFR calc non Af Amer: >90 mL/min (ref >90)
Glucose, Bld: 320 mg/dL — ABNORMAL HIGH (ref 70–99)
POTASSIUM: 4 meq/L (ref 3.7–5.3)
Sodium: 137 mEq/L (ref 137–147)

## 2013-12-06 LAB — D-DIMER, QUANTITATIVE (NOT AT ARMC): D DIMER QUANT: 0.54 ug{FEU}/mL — AB (ref 0.00–0.48)

## 2013-12-06 LAB — TROPONIN I: Troponin I: <0.3 ng/mL (ref <0.30)

## 2013-12-06 LAB — CBG MONITORING, ED: Glucose-Capillary: 223 mg/dL — ABNORMAL HIGH (ref 70–99)

## 2013-12-06 LAB — PRO B NATRIURETIC PEPTIDE: PRO B NATRI PEPTIDE: 65.8 pg/mL (ref 0–125)

## 2013-12-06 MED ORDER — DIPHENHYDRAMINE HCL 50 MG/ML IJ SOLN
INTRAMUSCULAR | Status: AC
  Administered 2013-12-06: 50 mg via INTRAVENOUS
  Filled 2013-12-06: qty 1

## 2013-12-06 MED ORDER — AZITHROMYCIN 250 MG PO TABS: ORAL_TABLET | ORAL | Status: DC

## 2013-12-06 MED ORDER — MORPHINE SULFATE 4 MG/ML IJ SOLN
4.0000 mg | Freq: Once | INTRAMUSCULAR | Status: AC
  Administered 2013-12-06: 4 mg via INTRAVENOUS
  Filled 2013-12-06: qty 1

## 2013-12-06 MED ORDER — DIPHENHYDRAMINE HCL 50 MG/ML IJ SOLN
50.0000 mg | Freq: Once | INTRAMUSCULAR | Status: AC
  Administered 2013-12-06: 50 mg via INTRAVENOUS

## 2013-12-06 MED ORDER — AZITHROMYCIN 250 MG PO TABS
500.0000 mg | ORAL_TABLET | Freq: Once | ORAL | Status: AC
  Administered 2013-12-06: 500 mg via ORAL
  Filled 2013-12-06: qty 2

## 2013-12-06 MED ORDER — IOHEXOL 350 MG/ML SOLN
100.0000 mL | Freq: Once | INTRAVENOUS | Status: AC | PRN
  Administered 2013-12-06: 100 mL via INTRAVENOUS

## 2013-12-06 MED ORDER — SODIUM CHLORIDE 0.9 % IV BOLUS (SEPSIS)
1000.0000 mL | Freq: Once | INTRAVENOUS | Status: AC
  Administered 2013-12-06: 1000 mL via INTRAVENOUS

## 2013-12-06 NOTE — ED Notes (Signed)
Pt to triage in w/c, reports chest "tight" off and on x 1 week, with some intermittent sob and feeling dizzy. Pt reports that she also has fibroids and has been dx with anemia, reports constant bleeding using tampon and pad "every little while" for months.

## 2013-12-06 NOTE — Discharge Instructions (Signed)
Take antibiotic beginning tomorrow as you were given the first dose in the emergency department today. Followup with your primary care physician in one week and gynecologist as soon as possible.  Pneumonia Pneumonia is an infection of the lungs.  CAUSES Pneumonia may be caused by bacteria or a virus. Usually, these infections are caused by breathing infectious particles into the lungs (respiratory tract). SIGNS AND SYMPTOMS   Cough.  Fever.  Chest pain.  Increased rate of breathing.  Wheezing.  Mucus production. DIAGNOSIS  If you have the common symptoms of pneumonia, your health care provider will typically confirm the diagnosis with a chest X-ray. The X-ray will show an abnormality in the lung (pulmonary infiltrate) if you have pneumonia. Other tests of your blood, urine, or sputum may be done to find the specific cause of your pneumonia. Your health care provider may also do tests (blood gases or pulse oximetry) to see how well your lungs are working. TREATMENT  Some forms of pneumonia may be spread to other people when you cough or sneeze. You may be asked to wear a mask before and during your exam. Pneumonia that is caused by bacteria is treated with antibiotic medicine. Pneumonia that is caused by the influenza virus may be treated with an antiviral medicine. Most other viral infections must run their course. These infections will not respond to antibiotics.  HOME CARE INSTRUCTIONS   Cough suppressants may be used if you are losing too much rest. However, coughing protects you by clearing your lungs. You should avoid using cough suppressants if you can.  Your health care provider may have prescribed medicine if he or she thinks your pneumonia is caused by bacteria or influenza. Finish your medicine even if you start to feel better.  Your health care provider may also prescribe an expectorant. This loosens the mucus to be coughed up.  Take medicines only as directed by your health  care provider.  Do not smoke. Smoking is a common cause of bronchitis and can contribute to pneumonia. If you are a smoker and continue to smoke, your cough may last several weeks after your pneumonia has cleared.  A cold steam vaporizer or humidifier in your room or home may help loosen mucus.  Coughing is often worse at night. Sleeping in a semi-upright position in a recliner or using a couple pillows under your head will help with this.  Get rest as you feel it is needed. Your body will usually let you know when you need to rest. PREVENTION A pneumococcal shot (vaccine) is available to prevent a common bacterial cause of pneumonia. This is usually suggested for:  People over 75 years old.  Patients on chemotherapy.  People with chronic lung problems, such as bronchitis or emphysema.  People with immune system problems. If you are over 65 or have a high risk condition, you may receive the pneumococcal vaccine if you have not received it before. In some countries, a routine influenza vaccine is also recommended. This vaccine can help prevent some cases of pneumonia.You may be offered the influenza vaccine as part of your care. If you smoke, it is time to quit. You may receive instructions on how to stop smoking. Your health care provider can provide medicines and counseling to help you quit. SEEK MEDICAL CARE IF: You have a fever. SEEK IMMEDIATE MEDICAL CARE IF:   Your illness becomes worse. This is especially true if you are elderly or weakened from any other disease.  You cannot control  your cough with suppressants and are losing sleep.  You begin coughing up blood.  You develop pain which is getting worse or is uncontrolled with medicines.  Any of the symptoms which initially brought you in for treatment are getting worse rather than better.  You develop shortness of breath or chest pain. MAKE SURE YOU:   Understand these instructions.  Will watch your condition.  Will  get help right away if you are not doing well or get worse. Document Released: 04/11/2005 Document Revised: 08/26/2013 Document Reviewed: 07/01/2010 [facility] Patient Information 2015 [place], [place]. This information is not intended to replace advice given to you by your health care provider. Make sure you discuss any questions you have with your health care provider.  Shortness of Breath Shortness of breath means you have trouble breathing. It could also mean that you have a medical problem. You should get immediate medical care for shortness of breath. CAUSES   Not enough oxygen in the air such as with high altitudes or a smoke-filled room.  Certain lung diseases, infections, or problems.  Heart disease or conditions, such as angina or heart failure.  Low red blood cells (anemia).  Poor physical fitness, which can cause shortness of breath when you exercise.  Chest or back injuries or stiffness.  Being overweight.  Smoking.  Anxiety, which can make you feel like you are not getting enough air. DIAGNOSIS  Serious medical problems can often be found during your physical exam. Tests may also be done to determine why you are having shortness of breath. Tests may include:  Chest X-rays.  Lung function tests.  Blood tests.  An electrocardiogram (ECG).  An ambulatory electrocardiogram. An ambulatory ECG records your heartbeat patterns over a 24-hour period.  Exercise testing.  A transthoracic echocardiogram (TTE). During echocardiography, sound waves are used to evaluate how blood flows through your heart.  A transesophageal echocardiogram (TEE).  Imaging scans. Your health care provider may not be able to find a cause for your shortness of breath after your exam. In this case, it is important to have a follow-up exam with your health care provider as directed.  TREATMENT  Treatment for shortness of breath depends on the cause of your symptoms and can vary greatly. HOME  CARE INSTRUCTIONS   Do not smoke. Smoking is a common cause of shortness of breath. If you smoke, ask for help to quit.  Avoid being around chemicals or things that may bother your breathing, such as paint fumes and dust.  Rest as needed. Slowly resume your usual activities.  If medicines were prescribed, take them as directed for the full length of time directed. This includes oxygen and any inhaled medicines.  Keep all follow-up appointments as directed by your health care provider. SEEK MEDICAL CARE IF:   Your condition does not improve in the time expected.  You have a hard time doing your normal activities even with rest.  You have any new symptoms. SEEK IMMEDIATE MEDICAL CARE IF:   Your shortness of breath gets worse.  You feel light-headed, faint, or develop a cough not controlled with medicines.  You start coughing up blood.  You have pain with breathing.  You have chest pain or pain in your arms, shoulders, or abdomen.  You have a fever.  You are unable to walk up stairs or exercise the way you normally do. MAKE SURE YOU:  Understand these instructions.  Will watch your condition.  Will get help right away if you are  not doing well or get worse. Document Released: 01/04/2001 Document Revised: 04/16/2013 Document Reviewed: 06/27/2011 [facility] Patient Information 2015 [place], [place]. This information is not intended to replace advice given to you by your health care provider. Make sure you discuss any questions you have with your health care provider.  Dysfunctional Uterine Bleeding Normally, menstrual periods begin between ages 92 to 45 in young women. A normal menstrual cycle/period may begin every 23 days up to 35 days and lasts from 1 to 7 days. Around 12 to 14 days before your menstrual period starts, ovulation (ovary produces an egg) occurs. When counting the time between menstrual periods, count from the first day of bleeding of the previous period to the  first day of bleeding of the next period. Dysfunctional (abnormal) uterine bleeding is bleeding that is different from a normal menstrual period. Your periods may come earlier or later than usual. They may be lighter, have blood clots or be heavier. You may have bleeding between periods, or you may skip one period or more. You may have bleeding after sexual intercourse, bleeding after menopause, or no menstrual period. CAUSES   Pregnancy (normal, miscarriage, tubal).  IUDs (intrauterine device, birth control).  Birth control pills.  Hormone treatment.  Menopause.  Infection of the cervix.  Blood clotting problems.  Infection of the inside lining of the uterus.  Endometriosis, inside lining of the uterus growing in the pelvis and other female organs.  Adhesions (scar tissue) inside the uterus.  Obesity or severe weight loss.  Uterine polyps inside the uterus.  Cancer of the vagina, cervix, or uterus.  Ovarian cysts or polycystic ovary syndrome.  Medical problems (diabetes, thyroid disease).  Uterine fibroids (noncancerous tumor).  Problems with your female hormones.  Endometrial hyperplasia, very thick lining and enlarged cells inside of the uterus.  Medicines that interfere with ovulation.  Radiation to the pelvis or abdomen.  Chemotherapy. DIAGNOSIS   Your doctor will discuss the history of your menstrual periods, medicines you are taking, changes in your weight, stress in your life, and any medical problems you may have.  Your doctor will do a physical and pelvic examination.  Your doctor may want to perform certain tests to make a diagnosis, such as:  Pap test.  Blood tests.  Cultures for infection.  CT scan.  Ultrasound.  Hysteroscopy.  Laparoscopy.  MRI.  Hysterosalpingography.  D and C.  Endometrial biopsy. TREATMENT  Treatment will depend on the cause of the dysfunctional uterine bleeding (DUB). Treatment may include:  Observing your  menstrual periods for a couple of months.  Prescribing medicines for medical problems, including:  Antibiotics.  Hormones.  Birth control pills.  Removing an IUD (intrauterine device, birth control).  Surgery:  D and C (scrape and remove tissue from inside the uterus).  Laparoscopy (examine inside the abdomen with a lighted tube).  Uterine ablation (destroy lining of the uterus with electrical current, laser, heat, or freezing).  Hysteroscopy (examine cervix and uterus with a lighted tube).  Hysterectomy (remove the uterus). HOME CARE INSTRUCTIONS   If medicines were prescribed, take exactly as directed. Do not change or switch medicines without consulting your caregiver.  Long term heavy bleeding may result in iron deficiency. Your caregiver may have prescribed iron pills. They help replace the iron that your body lost from heavy bleeding. Take exactly as directed.  Do not take aspirin or medicines that contain aspirin one week before or during your menstrual period. Aspirin may make the bleeding worse.  If  you need to change your sanitary pad or tampon more than once every 2 hours, stay in bed with your feet elevated and a cold pack on your lower abdomen. Rest as much as possible, until the bleeding stops or slows down.  Eat well-balanced meals. Eat foods high in iron. Examples are:  Leafy green vegetables.  Whole-grain breads and cereals.  Eggs.  Meat.  Liver.  Do not try to lose weight until the abnormal bleeding has stopped and your blood iron level is back to normal. Do not lift more than ten pounds or do strenuous activities when you are bleeding.  For a couple of months, make note on your calendar, marking the start and ending of your period, and the type of bleeding (light, medium, heavy, spotting, clots or missed periods). This is for your caregiver to better evaluate your problem. SEEK MEDICAL CARE IF:   You develop nausea (feeling sick to your stomach)  and vomiting, dizziness, or diarrhea while you are taking your medicine.  You are getting lightheaded or weak.  You have any problems that may be related to the medicine you are taking.  You develop pain with your DUB.  You want to remove your IUD.  You want to stop or change your birth control pills or hormones.  You have any type of abnormal bleeding mentioned above.  You are over 65 years old and have not had a menstrual period yet.  You are 53 years old and you are still having menstrual periods.  You have any of the symptoms mentioned above.  You develop a rash. SEEK IMMEDIATE MEDICAL CARE IF:   An oral temperature above 102 F (38.9 C) develops.  You develop chills.  You are changing your sanitary pad or tampon more than once an hour.  You develop abdominal pain.  You pass out or faint. Document Released: 04/08/2000 Document Revised: 07/04/2011 Document Reviewed: 03/10/2009 [facility] Patient Information 2015 [place], [place]. This information is not intended to replace advice given to you by your health care provider. Make sure you discuss any questions you have with your health care provider.

## 2013-12-06 NOTE — ED Provider Notes (Signed)
CSN: [id number]     Arrival date & time 12/06/13  1553 History   First MD Initiated Contact with Patient 12/06/13 1626     Chief Complaint  Patient presents with  . Chest Pain     (Consider location/radiation/quality/duration/timing/severity/associated sxs/prior Treatment) HPI Comments: Patient is a 53 year old morbidly obese female with a past medical history of diabetes, hypertension, fibromyalgia, depression, asthma and anxiety who presents to the emergency department complaining of chest tightness intermittently x4 days. Nothing in specific makes the pain come or go. Pain located on the left side of her chest radiating towards her left arm. Admits to associated shortness of breath, both at rest and on exertion. States today she felt lightheaded and dizzy and almost "blacked out". She called her primary care physician who advised her to come to the emergency department to have her blood levels checked. Patient also reports she has been experiencing vaginal bleeding. She had her last menstrual cycle 2 weeks ago and it began again 4 days ago. She is currently under the care of a gynecologist who is scheduling her to remove uterine fibroids that caused abnormal uterine bleeding. Currently she is not having any abdominal, pelvic or vaginal pain.  Patient is a 53 y.o. female presenting with chest pain. The history is provided by the patient.  Chest Pain Associated symptoms: shortness of breath     Past Medical History  Diagnosis Date  . Hypertension   . Diabetes mellitus   . Fibromyalgia   . Depression   . Asthma   . Anxiety    Past Surgical History  Procedure Laterality Date  . Uterine fibroid surgery    . Cesarean section     History reviewed. No pertinent family history. History  Substance Use Topics  . Smoking status: Never Smoker   . Smokeless tobacco: Not on file  . Alcohol Use:    OB History   Grav Para Term Preterm Abortions TAB SAB Ect Mult Living                  Review of Systems  Respiratory: Positive for shortness of breath.   Cardiovascular: Positive for chest pain.  Genitourinary: Positive for vaginal bleeding.  All other systems reviewed and are negative.     Allergies  Oxycodone-aspirin  Home Medications   Prior to Admission medications   Medication Sig Start Date End Date Taking? Authorizing Provider  insulin aspart (NOVOLOG) 100 UNIT/ML injection Inject into the skin 3 (three) times daily before meals. Per sliding scale   Yes Historical Provider, MD  albuterol (PROVENTIL HFA;VENTOLIN HFA) 108 (90 BASE) MCG/ACT inhaler Inhale 2 puffs into the lungs every 6 (six) hours as needed. For shortness of breath and wheezing     Historical Provider, MD  ALPRAZolam Duanne Moron) 1 MG tablet Take 1 mg by mouth 3 (three) times daily as needed. For anxiety      Historical Provider, MD  aspirin 325 MG EC tablet Take 325 mg by mouth daily.      Historical Provider, MD  BuPROPion HCl (WELLBUTRIN XL PO) Take 1 tablet by mouth daily.      Historical Provider, MD  esomeprazole (NEXIUM) 40 MG capsule Take 40 mg by mouth daily before breakfast.      Historical Provider, MD  FLUoxetine (PROZAC) 40 MG capsule Take 80 mg by mouth daily.      Historical Provider, MD  Fluticasone-Salmeterol (ADVAIR) 500-50 MCG/DOSE AEPB Inhale 1 puff into the lungs every 12 (twelve) hours.  Historical Provider, MD  glipiZIDE-metformin (METAGLIP) 5-500 MG per tablet Take 1 tablet by mouth daily.      Historical Provider, MD  insulin glargine (LANTUS) 100 UNIT/ML injection Inject 20 Units into the skin at bedtime.      Historical Provider, MD  mometasone (NASONEX) 50 MCG/ACT nasal spray Place 2 sprays into both nostrils daily.      Historical Provider, MD  oxycodone (OXY-IR) 5 MG capsule Take 5 mg by mouth every 4 (four) hours as needed. For pain     Historical Provider, MD  zolpidem (AMBIEN) 10 MG tablet Take 10 mg by mouth at bedtime.      Historical Provider, MD   BP 158/111   Pulse 108  Temp(Src) 98 F (36.7 C) (Oral)  Resp 18  Ht 5\' 6"  (1.676 m)  Wt 289 lb (131.09 kg)  BMI 46.67 kg/m2  SpO2 100% Physical Exam  Nursing note and vitals reviewed. Constitutional: She is oriented to person, place, and time. She appears well-developed and well-nourished. No distress.  Morbidly obese.  HENT:  Head: Normocephalic and atraumatic.  Mouth/Throat: Oropharynx is clear and moist.  Eyes: Conjunctivae and EOM are normal. Pupils are equal, round, and reactive to light.  Neck: Normal range of motion. Neck supple. No JVD present.  Cardiovascular: Regular rhythm, normal heart sounds and intact distal pulses.   Tachycardia. No extremity edema.  Pulmonary/Chest: Effort normal and breath sounds normal. No respiratory distress.  Tenderness across chest wall, different from pain pt is experiencing.  Abdominal: Soft. Bowel sounds are normal. There is no tenderness.  Musculoskeletal: Normal range of motion. She exhibits no edema.  Neurological: She is alert and oriented to person, place, and time. She has normal strength. No sensory deficit.  Speech fluent, goal oriented. Moves limbs without ataxia. Equal grip strength bilateral.  Skin: Skin is warm and dry. She is not diaphoretic.  Psychiatric: She has a normal mood and affect. Her behavior is normal.    ED Course  Procedures (including critical care time) Labs Review Labs Reviewed  CBC - Abnormal; Notable for the following:    Hemoglobin 11.0 (*)    HCT 33.4 (*)    MCV 77.1 (*)    MCH 25.4 (*)    All other components within normal limits  BASIC METABOLIC PANEL - Abnormal; Notable for the following:    Glucose, Bld 320 (*)    All other components within normal limits  D-DIMER, QUANTITATIVE - Abnormal; Notable for the following:    D-Dimer, Quant 0.54 (*)    All other components within normal limits  CBG MONITORING, ED - Abnormal; Notable for the following:    Glucose-Capillary 223 (*)    All other components  within normal limits  TROPONIN I  PRO B NATRIURETIC PEPTIDE    Imaging Review Dg Chest 2 View  12/06/2013   CLINICAL DATA:  Chest pain  EXAM: CHEST  2 VIEW  COMPARISON:  04/03/2011  FINDINGS: Cardiomediastinal silhouette is stable. No acute infiltrate or pleural effusion. No pulmonary edema. Degenerative changes thoracic spine.  IMPRESSION: No active cardiopulmonary disease.   Electronically Signed   By: Lahoma Crocker M.D.   On: 12/06/2013 17:10   Ct Angio Chest Pe W/cm &/or Wo Cm  12/06/2013   CLINICAL DATA:  Chest pain and shortness of breath. Elevated D-dimer.  EXAM: CT ANGIOGRAPHY CHEST WITH CONTRAST  TECHNIQUE: Multidetector CT imaging of the chest was performed using the standard protocol during bolus administration of intravenous contrast. Multiplanar CT  image reconstructions and MIPs were obtained to evaluate the vascular anatomy.  CONTRAST:  151mL OMNIPAQUE IOHEXOL 350 MG/ML SOLN  COMPARISON:  Chest radiograph, 12/06/2013  FINDINGS: No evidence of a pulmonary embolus.  Heart is top-normal in size. Great vessels are normal in caliber no neck base, axillary, mediastinal or hilar masses or enlarged lymph nodes.  There is mild hazy opacity and associated reticular opacity in the posterior medial right lower lobe. This may be atelectasis and/or scarring. A small area of pneumonia is possible. There are additional minor peripheral reticular opacities in the right middle and lower lobes most likely subsegmental atelectasis. Lungs are otherwise clear. No pleural effusion.  Limited evaluation of the upper abdomen shows evidence of fatty infiltration of the liver but is otherwise unremarkable.  There are degenerative changes of the visualized spine. No osteoblastic or osteolytic lesions.  Review of the MIP images confirms the above findings.  IMPRESSION: 1. No evidence of a pulmonary embolus. 2. Mild area of hazy opacity in the posterior medial right lower lobe. This is likely subsegmental atelectasis.  Infiltrate/pneumonia is possible. 3. No other evidence of an acute abnormality.   Electronically Signed   By: Lajean Manes M.D.   On: 12/06/2013 19:09     EKG Interpretation None      MDM   Final diagnoses:  CAP (community acquired pneumonia)  Shortness of breath  Vaginal bleeding   Patient presenting with chest pain and shortness of breath. She is nontoxic appearing and in no apparent distress. Afebrile, tachycardic and hypertensive. Cannot PERC negative. Labs, CXR pending. EKG showing sinus tachycardia and LAD. 5:41 PM D-dimer elevated. Will obtain CT angio chest. 7:52 PM CT angiogram negative for PE. Mild area of hazy opacity in the posterior medial right lower lobe, likely subsegmental atelectasis, however infiltrate/pneumonia is possible. Given patient's symptoms of chest pain and shortness of breath, will treat for pneumonia with azithromycin. First dose given in the emergency department. Regarding vaginal bleeding, she is hemodynamically stable. Discussed importance of followup with gynecology for ultimate resolution of this problem. Followup with PCP. Stable for discharge. Return precautions given. Patient states understanding of treatment care plan and is agreeable.  Illene Labrador, PA-C 12/06/13 [phone]

## 2013-12-06 NOTE — ED Provider Notes (Signed)
History/physical exam/procedure(s) were performed by non-physician practitioner and as supervising physician I was immediately available for consultation/collaboration. I have reviewed all notes and am in agreement with care and plan.   Shaune Pollack, MD 12/06/13 [phone]

## 2013-12-06 NOTE — ED Notes (Signed)
Received phone call from CT regarding pt.  Possible reaction to IVP dye.  Pt states she began to feel sob and felt like something was in her throat.  Pt had been spitting up in emesis bag prior to arrival.  Benadryl given.  Hr 120.  No acute respiratory distress noted.  Pt taken back to room.  VS taken.  MD notified.

## 2014-04-16 ENCOUNTER — Encounter (HOSPITAL_BASED_OUTPATIENT_CLINIC_OR_DEPARTMENT_OTHER): Payer: Self-pay

## 2014-04-16 ENCOUNTER — Emergency Department (HOSPITAL_BASED_OUTPATIENT_CLINIC_OR_DEPARTMENT_OTHER)
Admission: EM | Admit: 2014-04-16 | Discharge: 2014-04-16 | Disposition: A | Discharge status: Home / Self Care | Payer: Medicaid Other | Attending: Emergency Medicine | Admitting: Emergency Medicine

## 2014-04-16 ENCOUNTER — Emergency Department (HOSPITAL_BASED_OUTPATIENT_CLINIC_OR_DEPARTMENT_OTHER): Payer: Medicaid Other

## 2014-04-16 DIAGNOSIS — E119 Type 2 diabetes mellitus without complications: Secondary | ICD-10-CM | POA: Diagnosis not present

## 2014-04-16 DIAGNOSIS — Z79899 Other long term (current) drug therapy: Secondary | ICD-10-CM | POA: Insufficient documentation

## 2014-04-16 DIAGNOSIS — R69 Illness, unspecified: Secondary | ICD-10-CM

## 2014-04-16 DIAGNOSIS — F419 Anxiety disorder, unspecified: Secondary | ICD-10-CM | POA: Insufficient documentation

## 2014-04-16 DIAGNOSIS — F329 Major depressive disorder, single episode, unspecified: Secondary | ICD-10-CM | POA: Diagnosis not present

## 2014-04-16 DIAGNOSIS — Z7982 Long term (current) use of aspirin: Secondary | ICD-10-CM | POA: Insufficient documentation

## 2014-04-16 DIAGNOSIS — J111 Influenza due to unidentified influenza virus with other respiratory manifestations: Secondary | ICD-10-CM | POA: Diagnosis not present

## 2014-04-16 DIAGNOSIS — Z794 Long term (current) use of insulin: Secondary | ICD-10-CM | POA: Diagnosis not present

## 2014-04-16 DIAGNOSIS — I10 Essential (primary) hypertension: Secondary | ICD-10-CM | POA: Diagnosis not present

## 2014-04-16 DIAGNOSIS — Z7951 Long term (current) use of inhaled steroids: Secondary | ICD-10-CM | POA: Insufficient documentation

## 2014-04-16 DIAGNOSIS — R079 Chest pain, unspecified: Secondary | ICD-10-CM

## 2014-04-16 DIAGNOSIS — J45909 Unspecified asthma, uncomplicated: Secondary | ICD-10-CM | POA: Diagnosis not present

## 2014-04-16 DIAGNOSIS — R52 Pain, unspecified: Secondary | ICD-10-CM | POA: Diagnosis present

## 2014-04-16 LAB — BASIC METABOLIC PANEL
Anion gap: 10 (ref 5–15)
BUN: 10 mg/dL (ref 6–23)
CHLORIDE: 101 meq/L (ref 96–112)
CO2: 25 mmol/L (ref 19–32)
Calcium: 8.6 mg/dL (ref 8.4–10.5)
Creatinine, Ser: 0.57 mg/dL (ref 0.50–1.10)
GFR calc non Af Amer: >90 mL/min (ref >90)
Glucose, Bld: 291 mg/dL — ABNORMAL HIGH (ref 70–99)
Potassium: 3.6 mmol/L (ref 3.5–5.1)
Sodium: 136 mmol/L (ref 135–145)

## 2014-04-16 LAB — CBC WITH DIFFERENTIAL/PLATELET
Basophils Absolute: 0 10*3/uL (ref 0.0–0.1)
Basophils Relative: 1 % (ref 0–1)
EOS ABS: 0.1 10*3/uL (ref 0.0–0.7)
Eosinophils Relative: 1 % (ref 0–5)
HCT: 32.1 % — ABNORMAL LOW (ref 36.0–46.0)
Hemoglobin: 10.5 g/dL — ABNORMAL LOW (ref 12.0–15.0)
LYMPHS ABS: 1.6 10*3/uL (ref 0.7–4.0)
Lymphocytes Relative: 26 % (ref 12–46)
MCH: 24.9 pg — AB (ref 26.0–34.0)
MCHC: 32.7 g/dL (ref 30.0–36.0)
MCV: 76.2 fL — AB (ref 78.0–100.0)
MONOS PCT: 9 % (ref 3–12)
Monocytes Absolute: 0.5 10*3/uL (ref 0.1–1.0)
Neutro Abs: 4 10*3/uL (ref 1.7–7.7)
Neutrophils Relative %: 63 % (ref 43–77)
PLATELETS: 273 10*3/uL (ref 150–400)
RBC: 4.21 MIL/uL (ref 3.87–5.11)
RDW: 14.9 % (ref 11.5–15.5)
WBC: 6.3 10*3/uL (ref 4.0–10.5)

## 2014-04-16 LAB — TROPONIN I: Troponin I: <0.03 ng/mL (ref <0.031)

## 2014-04-16 MED ORDER — HYDROCODONE-HOMATROPINE 5-1.5 MG/5ML PO SYRP
5.0000 mL | ORAL_SOLUTION | Freq: Once | ORAL | Status: DC
  Filled 2014-04-16: qty 5

## 2014-04-16 MED ORDER — DIPHENHYDRAMINE HCL 25 MG PO CAPS
25.0000 mg | ORAL_CAPSULE | Freq: Once | ORAL | Status: AC
  Administered 2014-04-16: 25 mg via ORAL
  Filled 2014-04-16: qty 1

## 2014-04-16 MED ORDER — KETOROLAC TROMETHAMINE 30 MG/ML IJ SOLN
30.0000 mg | Freq: Once | INTRAMUSCULAR | Status: AC
  Administered 2014-04-16: 30 mg via INTRAVENOUS
  Filled 2014-04-16: qty 1

## 2014-04-16 MED ORDER — SODIUM CHLORIDE 0.9 % IV BOLUS (SEPSIS)
1000.0000 mL | Freq: Once | INTRAVENOUS | Status: AC
  Administered 2014-04-16: 1000 mL via INTRAVENOUS

## 2014-04-16 MED ORDER — GUAIFENESIN-CODEINE 100-10 MG/5ML PO SOLN
5.0000 mL | Freq: Once | ORAL | Status: AC
  Administered 2014-04-16: 5 mL via ORAL
  Filled 2014-04-16: qty 5

## 2014-04-16 MED ORDER — IBUPROFEN 800 MG PO TABS
800.0000 mg | ORAL_TABLET | Freq: Once | ORAL | Status: AC
  Administered 2014-04-16: 800 mg via ORAL
  Filled 2014-04-16: qty 1

## 2014-04-16 MED ORDER — HYDROCODONE-HOMATROPINE 5-1.5 MG/5ML PO SYRP: 5.0000 mL | ORAL_SOLUTION | Freq: Four times a day (QID) | ORAL | Status: DC | PRN

## 2014-04-16 NOTE — ED Notes (Signed)
Pt presents with numerous complaints: headache, cough, congestion, chest pain, vomiting. Reports pain started last night.

## 2014-04-16 NOTE — Discharge Instructions (Signed)

## 2014-04-16 NOTE — ED Notes (Signed)
Pt called out, requesting something for her HA. Informed pt that I couldn't give her any medication until she is seen by the provider.

## 2014-04-16 NOTE — ED Provider Notes (Signed)
CSN: [id number]     Arrival date & time 04/16/14  0165 History   First MD Initiated Contact with Patient 04/16/14 1117     Chief Complaint  Patient presents with  . Generalized Body Aches     (Consider location/radiation/quality/duration/timing/severity/associated sxs/prior Treatment) HPI Comments: Patient reports about 2 weeks of constant coughing since being diagnosed with pneumonia and treated with a Z-Pak.  She states last night she began having subjective fevers, chills, myalgias, sore throat and midsternal chest tightness that is constant though worse with cough.  She has also had associated shortness of breath, nausea and several episodes of vomiting but no diarrhea, no abdominal pain, no urinary symptoms.  She's had no sick contacts.  She does not perceive the flu vaccination.  She is not immunocompromised   Past Medical History  Diagnosis Date  . Hypertension   . Diabetes mellitus   . Fibromyalgia   . Depression   . Asthma   . Anxiety    Past Surgical History  Procedure Laterality Date  . Uterine fibroid surgery    . Cesarean section     No family history on file. History  Substance Use Topics  . Smoking status: Never Smoker   . Smokeless tobacco: Not on file  . Alcohol Use: Not on file   OB History    No data available     Review of Systems  Constitutional: Positive for fever, chills, activity change, appetite change and fatigue. Negative for diaphoresis.  HENT: Positive for rhinorrhea and sore throat. Negative for congestion and facial swelling.   Eyes: Negative for photophobia and discharge.  Respiratory: Positive for cough and shortness of breath. Negative for chest tightness.   Cardiovascular: Positive for chest pain. Negative for palpitations and leg swelling.  Gastrointestinal: Negative for nausea, vomiting, abdominal pain and diarrhea.  Endocrine: Negative for polydipsia and polyuria.  Genitourinary: Negative for dysuria, frequency, difficulty urinating  and pelvic pain.  Musculoskeletal: Negative for back pain, arthralgias, neck pain and neck stiffness.  Skin: Negative for color change and wound.  Allergic/Immunologic: Negative for immunocompromised state.  Neurological: Negative for facial asymmetry, weakness, numbness and headaches.  Hematological: Does not bruise/bleed easily.  Psychiatric/Behavioral: Negative for confusion and agitation.      Allergies  Oxycodone-aspirin  Home Medications   Prior to Admission medications   Medication Sig Start Date End Date Taking? Authorizing Provider  albuterol (PROVENTIL HFA;VENTOLIN HFA) 108 (90 BASE) MCG/ACT inhaler Inhale 2 puffs into the lungs every 6 (six) hours as needed. For shortness of breath and wheezing     Historical Provider, MD  ALPRAZolam Duanne Moron) 1 MG tablet Take 1 mg by mouth 3 (three) times daily as needed. For anxiety      Historical Provider, MD  aspirin 325 MG EC tablet Take 325 mg by mouth daily.      Historical Provider, MD  azithromycin (ZITHROMAX Z-PAK) 250 MG tablet 1 tab PO x 4 days 12/06/13   Carman Ching, PA-C  BuPROPion HCl (WELLBUTRIN XL PO) Take 1 tablet by mouth daily.      Historical Provider, MD  esomeprazole (NEXIUM) 40 MG capsule Take 40 mg by mouth daily before breakfast.      Historical Provider, MD  FLUoxetine (PROZAC) 40 MG capsule Take 80 mg by mouth daily.      Historical Provider, MD  Fluticasone-Salmeterol (ADVAIR) 500-50 MCG/DOSE AEPB Inhale 1 puff into the lungs every 12 (twelve) hours.      Historical Provider, MD  glipiZIDE-metformin [facility])  5-500 MG per tablet Take 1 tablet by mouth daily.      Historical Provider, MD  HYDROcodone-homatropine (HYCODAN) 5-1.5 MG/5ML syrup Take 5 mLs by mouth every 6 (six) hours as needed for cough. 04/16/14   Ernestina Patches, MD  insulin aspart (NOVOLOG) 100 UNIT/ML injection Inject into the skin 3 (three) times daily before meals. Per sliding scale    Historical Provider, MD  insulin glargine (LANTUS) 100 UNIT/ML  injection Inject 20 Units into the skin at bedtime.      Historical Provider, MD  mometasone (NASONEX) 50 MCG/ACT nasal spray Place 2 sprays into both nostrils daily.      Historical Provider, MD  oxycodone (OXY-IR) 5 MG capsule Take 5 mg by mouth every 4 (four) hours as needed. For pain     Historical Provider, MD  zolpidem (AMBIEN) 10 MG tablet Take 10 mg by mouth at bedtime.      Historical Provider, MD   BP 148/98 mmHg  Pulse 98  Temp(Src) 97.8 F (36.6 C) (Oral)  Resp 18  SpO2 99%  LMP 03/29/2014 Physical Exam  Constitutional: She is oriented to person, place, and time. She appears well-developed and well-nourished. No distress.  HENT:  Head: Normocephalic and atraumatic.  Mouth/Throat: No oropharyngeal exudate.  Eyes: Pupils are equal, round, and reactive to light.  Neck: Normal range of motion. Neck supple.  Cardiovascular: Normal rate, regular rhythm and normal heart sounds.  Exam reveals no gallop and no friction rub.   No murmur heard. Pulmonary/Chest: Effort normal and breath sounds normal. No respiratory distress. She has no wheezes. She has no rales.    Abdominal: Soft. Bowel sounds are normal. She exhibits no distension and no mass. There is no tenderness. There is no rebound and no guarding.  Musculoskeletal: Normal range of motion. She exhibits no edema or tenderness.  Neurological: She is alert and oriented to person, place, and time.  Skin: Skin is warm and dry.  Psychiatric: She has a normal mood and affect.    ED Course  Procedures (including critical care time) Labs Review Labs Reviewed  BASIC METABOLIC PANEL - Abnormal; Notable for the following:    Glucose, Bld 291 (*)    All other components within normal limits  CBC WITH DIFFERENTIAL - Abnormal; Notable for the following:    Hemoglobin 10.5 (*)    HCT 32.1 (*)    MCV 76.2 (*)    MCH 24.9 (*)    All other components within normal limits  TROPONIN I    Imaging Review Dg Chest 2 View  04/16/2014    CLINICAL DATA:  Mid chest pain, back pain, shortness of breath. Symptoms began less than 24 hr ago.  EXAM: CHEST  2 VIEW  COMPARISON:  Chest radiograph 12/06/2013.  FINDINGS: The heart size and mediastinal contours are within normal limits. Both lungs are clear. The visualized skeletal structures are unremarkable.  IMPRESSION: No active cardiopulmonary disease. No change from prior normal study.   Electronically Signed   By: Rolla Flatten M.D.   On: 04/16/2014 10:30     EKG Interpretation   Date/Time:  Wednesday April 16 2014 09:45:55 EST Ventricular Rate:  96 PR Interval:  166 QRS Duration: 78 QT Interval:  368 QTC Calculation: 464 R Axis:   -31 Text Interpretation:  Normal sinus rhythm Left axis deviation Abnormal ECG  No significant change since last tracing Confirmed by Jmarion Christiano  MD, Kathlynn Swofford  ([id number]) on 04/16/2014 10:01:13 AM      MDM  Final diagnoses:  Influenza-like illness    SUBJECTIVE:  Judy English is a 53 y.o. female who present complaining of flu-like symptoms: fevers, chills, myalgias, congestion, sore throat, headache for less than 24 hours, and cough for about 2 weeks since being treated for pneumonia. She denies wheezing, though has had some SOB as well as CP since last night which is worse with cough.  OBJECTIVE: Appears moderately ill but not toxic; temperature as noted in vitals. Ears normal. Throat and pharynx normal.  Neck supple. No adenopathy in the neck. Sinuses non tender. The chest is clear and is tender to palpation.  Chest pain.  History is atypical for ACS.  She has no risk factors for PE other than obesity.  I doubt PE given that she has no hypoxia, no persistent tachycardia, no leg pain or swelling.  She is in the low risk category using well's criteria for PE. Her TIMI score is 1 due to risk factors.   EKG without acute ischemic changes, troponin is negative.  Chest x-ray is clear.  CBC and BMP are unremarkable.    ASSESSMENT: Influenza  PLAN: Symptomatic therapy suggested: rest, increase fluids and OTC acetaminophen, ibuprofen.  Hycodan Given for cough.  Return to the ED should symptoms worsen, including worsening pain, shortness breath or inability to tolerate liquids   Ernestina Patches, MD 04/16/14 1341

## 2014-04-17 LAB — CBG MONITORING, ED: GLUCOSE-CAPILLARY: 298 mg/dL — AB (ref 70–99)

## 2014-06-29 ENCOUNTER — Emergency Department (HOSPITAL_BASED_OUTPATIENT_CLINIC_OR_DEPARTMENT_OTHER)
Admission: EM | Admit: 2014-06-29 | Discharge: 2014-06-29 | Disposition: A | Discharge status: Home / Self Care | Payer: Medicaid Other | Attending: Emergency Medicine | Admitting: Emergency Medicine

## 2014-06-29 ENCOUNTER — Emergency Department (HOSPITAL_BASED_OUTPATIENT_CLINIC_OR_DEPARTMENT_OTHER): Payer: Medicaid Other

## 2014-06-29 ENCOUNTER — Encounter (HOSPITAL_BASED_OUTPATIENT_CLINIC_OR_DEPARTMENT_OTHER): Payer: Self-pay | Admitting: *Deleted

## 2014-06-29 DIAGNOSIS — I10 Essential (primary) hypertension: Secondary | ICD-10-CM | POA: Diagnosis not present

## 2014-06-29 DIAGNOSIS — J45901 Unspecified asthma with (acute) exacerbation: Secondary | ICD-10-CM | POA: Diagnosis not present

## 2014-06-29 DIAGNOSIS — Z79899 Other long term (current) drug therapy: Secondary | ICD-10-CM | POA: Insufficient documentation

## 2014-06-29 DIAGNOSIS — R112 Nausea with vomiting, unspecified: Secondary | ICD-10-CM | POA: Diagnosis not present

## 2014-06-29 DIAGNOSIS — F329 Major depressive disorder, single episode, unspecified: Secondary | ICD-10-CM | POA: Insufficient documentation

## 2014-06-29 DIAGNOSIS — Z794 Long term (current) use of insulin: Secondary | ICD-10-CM | POA: Insufficient documentation

## 2014-06-29 DIAGNOSIS — E119 Type 2 diabetes mellitus without complications: Secondary | ICD-10-CM | POA: Insufficient documentation

## 2014-06-29 DIAGNOSIS — Z7951 Long term (current) use of inhaled steroids: Secondary | ICD-10-CM | POA: Insufficient documentation

## 2014-06-29 DIAGNOSIS — J159 Unspecified bacterial pneumonia: Secondary | ICD-10-CM | POA: Diagnosis not present

## 2014-06-29 DIAGNOSIS — M791 Myalgia: Secondary | ICD-10-CM | POA: Insufficient documentation

## 2014-06-29 DIAGNOSIS — J189 Pneumonia, unspecified organism: Secondary | ICD-10-CM

## 2014-06-29 DIAGNOSIS — F419 Anxiety disorder, unspecified: Secondary | ICD-10-CM | POA: Insufficient documentation

## 2014-06-29 DIAGNOSIS — Z7982 Long term (current) use of aspirin: Secondary | ICD-10-CM | POA: Diagnosis not present

## 2014-06-29 DIAGNOSIS — R05 Cough: Secondary | ICD-10-CM | POA: Diagnosis present

## 2014-06-29 LAB — CBG MONITORING, ED
Glucose-Capillary: 303 mg/dL — ABNORMAL HIGH (ref 70–99)
Glucose-Capillary: 260 mg/dL — ABNORMAL HIGH (ref 70–99)

## 2014-06-29 MED ORDER — DEXTROSE 5 % IV SOLN
1.0000 g | Freq: Once | INTRAVENOUS | Status: AC
  Administered 2014-06-29: 1 g via INTRAVENOUS

## 2014-06-29 MED ORDER — HYDROMORPHONE HCL 1 MG/ML IJ SOLN
1.0000 mg | Freq: Once | INTRAMUSCULAR | Status: AC
  Administered 2014-06-29: 1 mg via INTRAVENOUS
  Filled 2014-06-29: qty 1

## 2014-06-29 MED ORDER — AZITHROMYCIN 250 MG PO TABS: 250.0000 mg | ORAL_TABLET | Freq: Every day | ORAL | Status: DC

## 2014-06-29 MED ORDER — MORPHINE SULFATE 4 MG/ML IJ SOLN: 6.0000 mg | Freq: Once | INTRAMUSCULAR | Status: DC

## 2014-06-29 MED ORDER — ONDANSETRON HCL 4 MG/2ML IJ SOLN
INTRAMUSCULAR | Status: AC
  Filled 2014-06-29: qty 2

## 2014-06-29 MED ORDER — SODIUM CHLORIDE 0.9 % IV BOLUS (SEPSIS)
1000.0000 mL | Freq: Once | INTRAVENOUS | Status: AC
  Administered 2014-06-29: 1000 mL via INTRAVENOUS

## 2014-06-29 MED ORDER — MORPHINE SULFATE 4 MG/ML IJ SOLN
4.0000 mg | Freq: Once | INTRAMUSCULAR | Status: AC
  Administered 2014-06-29: 4 mg via INTRAVENOUS
  Filled 2014-06-29: qty 1

## 2014-06-29 MED ORDER — ONDANSETRON HCL 4 MG/2ML IJ SOLN
4.0000 mg | Freq: Once | INTRAMUSCULAR | Status: AC
  Administered 2014-06-29: 4 mg via INTRAVENOUS

## 2014-06-29 MED ORDER — CEFTRIAXONE SODIUM 1 G IJ SOLR
INTRAMUSCULAR | Status: AC
  Filled 2014-06-29: qty 10

## 2014-06-29 MED ORDER — HYDROCODONE-HOMATROPINE 5-1.5 MG/5ML PO SYRP: 5.0000 mL | ORAL_SOLUTION | Freq: Four times a day (QID) | ORAL | Status: DC | PRN

## 2014-06-29 NOTE — Discharge Instructions (Signed)

## 2014-06-29 NOTE — ED Notes (Addendum)
C/o wheezes and h/a with high blood sugar since Thursday. NP cough. Has taken inhaler x 2 today. N/v. No diarrhea. Also c/o left arm heaviness.

## 2014-06-29 NOTE — ED Notes (Signed)
Blood glucose 260.

## 2014-06-29 NOTE — ED Provider Notes (Signed)
CSN: [id number]     Arrival date & time 06/29/14  1513 History  This chart was scribed for Malvin Johns, MD by Molli Posey, ED Scribe. This patient was seen in room MH04/MH04 and the patient's care was started 5:31 PM.    Chief Complaint  Patient presents with  . Bronchitis   The history is provided by the patient. No language interpreter was used.   HPI Comments: Judy English is a 54 y.o. female with a history of HTN, DM and asthma who presents to the Emergency Department complaining of a productive cough that she can not clear past her throat for the last 4 days. Pt reports that she has left sided abdominal, chest and left arm pain when she coughs. Pt reports associated decrease in appetite, myalgias, SOB, congestion, fever with a maximum temperature of 102 and a HA. She reports a history of neck spasms that radiate to her left arm and states that her current symptoms have exacerbated her spasms. Pt states that she has an albuterol and Advair disc at home for her history of asthma and bronchitis which has failed to relieve her symptoms. Pt reports she did not receive a flu shot this year. She reports no history of smoking. She denies diarrhea.    Past Medical History  Diagnosis Date  . Hypertension   . Diabetes mellitus   . Fibromyalgia   . Depression   . Asthma   . Anxiety    Past Surgical History  Procedure Laterality Date  . Uterine fibroid surgery    . Cesarean section     No family history on file. History  Substance Use Topics  . Smoking status: Never Smoker   . Smokeless tobacco: Not on file  . Alcohol Use: Not on file   OB History    No data available     Review of Systems  Constitutional: Positive for fever and appetite change.  HENT: Positive for congestion and rhinorrhea.   Respiratory: Positive for cough, shortness of breath and wheezing.   Gastrointestinal: Positive for nausea and vomiting. Negative for diarrhea.  Musculoskeletal: Positive for myalgias  and neck pain.  Neurological: Positive for headaches.  All other systems reviewed and are negative.   Allergies  Oxycodone-aspirin  Home Medications   Prior to Admission medications   Medication Sig Start Date End Date Taking? Authorizing Provider  albuterol (PROVENTIL HFA;VENTOLIN HFA) 108 (90 BASE) MCG/ACT inhaler Inhale 2 puffs into the lungs every 6 (six) hours as needed. For shortness of breath and wheezing    Yes Historical Provider, MD  ALPRAZolam Duanne Moron) 1 MG tablet Take 1 mg by mouth 3 (three) times daily as needed. For anxiety     Yes Historical Provider, MD  aspirin 325 MG EC tablet Take 325 mg by mouth daily.     Yes Historical Provider, MD  BuPROPion HCl (WELLBUTRIN XL PO) Take 1 tablet by mouth daily.     Yes Historical Provider, MD  FLUoxetine (PROZAC) 40 MG capsule Take 80 mg by mouth daily.     Yes Historical Provider, MD  Fluticasone-Salmeterol (ADVAIR) 500-50 MCG/DOSE AEPB Inhale 1 puff into the lungs every 12 (twelve) hours.     Yes Historical Provider, MD  insulin aspart (NOVOLOG) 100 UNIT/ML injection Inject into the skin 3 (three) times daily before meals. Per sliding scale   Yes Historical Provider, MD  mometasone (NASONEX) 50 MCG/ACT nasal spray Place 2 sprays into both nostrils daily.     Yes Historical Provider, MD  oxycodone (OXY-IR) 5 MG capsule Take 5 mg by mouth every 4 (four) hours as needed. For pain    Yes Historical Provider, MD  azithromycin (ZITHROMAX) 250 MG tablet Take 1 tablet (250 mg total) by mouth daily. Take first 2 tablets together, then 1 every day until finished. 06/29/14   Malvin Johns, MD  esomeprazole (NEXIUM) 40 MG capsule Take 40 mg by mouth daily before breakfast.      Historical Provider, MD  glipiZIDE-metformin (METAGLIP) 5-500 MG per tablet Take 1 tablet by mouth daily.      Historical Provider, MD  HYDROcodone-homatropine (HYCODAN) 5-1.5 MG/5ML syrup Take 5 mLs by mouth every 6 (six) hours as needed for cough. 06/29/14   Malvin Johns, MD   insulin glargine (LANTUS) 100 UNIT/ML injection Inject 20 Units into the skin at bedtime.      Historical Provider, MD  zolpidem (AMBIEN) 10 MG tablet Take 10 mg by mouth at bedtime.      Historical Provider, MD   BP 143/79 mmHg  Pulse 111  Temp(Src) 99.8 F (37.7 C) (Oral)  Resp 21  Ht 5\' 7"  (1.702 m)  Wt 278 lb (126.1 kg)  BMI 43.53 kg/m2  SpO2 88% Physical Exam  Constitutional: She is oriented to person, place, and time. She appears well-developed and well-nourished.  HENT:  Head: Normocephalic and atraumatic.  Eyes: Pupils are equal, round, and reactive to light. Right eye exhibits no discharge. Left eye exhibits no discharge.  Neck: Normal range of motion. Neck supple. No tracheal deviation present.  Cardiovascular: Normal rate, regular rhythm and normal heart sounds.   Pulmonary/Chest: Effort normal. No respiratory distress. She has no wheezes. She has no rales. She exhibits tenderness (soreness across the left chest on palpation).  rhonchi  Abdominal: Soft. Bowel sounds are normal. She exhibits no distension. There is no tenderness. There is no rebound and no guarding.  Musculoskeletal: Normal range of motion. She exhibits no edema.  No calf tenderness  Tenderness to the left trapezius muscle.  Lymphadenopathy:    She has no cervical adenopathy.  Neurological: She is alert and oriented to person, place, and time.  Skin: Skin is warm and dry. No rash noted.  Psychiatric: She has a normal mood and affect.  Nursing note and vitals reviewed.   ED Course  Procedures   DIAGNOSTIC STUDIES: Oxygen Saturation is 95% on RA, normal by my interpretation.    COORDINATION OF CARE: 5:50 PM Discussed treatment plan with pt at bedside and pt agreed to plan.   Labs Review Labs Reviewed  CBG MONITORING, ED - Abnormal; Notable for the following:    Glucose-Capillary 303 (*)    All other components within normal limits  CBG MONITORING, ED    Imaging Review Dg Chest 2  View  06/29/2014   CLINICAL DATA:  Bronchitis  EXAM: CHEST  2 VIEW  COMPARISON:  04/16/2014  FINDINGS: Mild left lower lobe airspace disease with increased density over the spine on the lateral view. This may represent pneumonia or atelectasis. No significant effusion. Right lung is clear. Negative for heart failure.  IMPRESSION: Mild left lower lobe atelectasis/ pneumonia.   Electronically Signed   By: Franchot Gallo M.D.   On: 06/29/2014 17:36     EKG Interpretation   Date/Time:  Sunday June 29 2014 15:53:26 EST Ventricular Rate:  120 PR Interval:  164 QRS Duration: 86 QT Interval:  310 QTC Calculation: 438 R Axis:   -13 Text Interpretation:  Sinus tachycardia Cannot rule out  Anterior infarct ,  age undetermined Abnormal ECG Since last tracing rate faster Confirmed by  Sherelle Castelli  MD, Annsley Akkerman ([id number]) on 06/29/2014 5:20:12 PM      MDM   Final diagnoses:  Community acquired pneumonia   Pt with cough/cold symptoms for 4 days.  +left sided chest pain with coughing.  No hypoxia.  Her sats remained in the mid 90s. She doesn't report any shortness of breath. She's had no ongoing vomiting. Her chest x-ray shows a small area of left lower lobe pneumonia. I feel that at this point she can be treated as an outpatient. She is okay with this. She feels well enough to go home. She was given a dose Rocephin in the ED and given a prescription for Zithromax. She was also given a prescription for Hycodan cough syrup. She was given strict return precautions to return immediately if she has any worsening symptoms, increased shortness of breath or ongoing vomiting.  I personally performed the services described in this documentation, which was scribed in my presence.  The recorded information has been reviewed and considered.      Malvin Johns, MD 06/29/14 2218

## 2014-10-05 ENCOUNTER — Emergency Department (HOSPITAL_BASED_OUTPATIENT_CLINIC_OR_DEPARTMENT_OTHER): Payer: Medicaid Other

## 2014-10-05 ENCOUNTER — Encounter (HOSPITAL_BASED_OUTPATIENT_CLINIC_OR_DEPARTMENT_OTHER): Payer: Self-pay | Admitting: Emergency Medicine

## 2014-10-05 ENCOUNTER — Emergency Department (HOSPITAL_BASED_OUTPATIENT_CLINIC_OR_DEPARTMENT_OTHER)
Admission: EM | Admit: 2014-10-05 | Discharge: 2014-10-05 | Disposition: A | Discharge status: Home / Self Care | Payer: Medicaid Other | Attending: Emergency Medicine | Admitting: Emergency Medicine

## 2014-10-05 DIAGNOSIS — Z7951 Long term (current) use of inhaled steroids: Secondary | ICD-10-CM | POA: Diagnosis not present

## 2014-10-05 DIAGNOSIS — F329 Major depressive disorder, single episode, unspecified: Secondary | ICD-10-CM | POA: Insufficient documentation

## 2014-10-05 DIAGNOSIS — M797 Fibromyalgia: Secondary | ICD-10-CM | POA: Diagnosis not present

## 2014-10-05 DIAGNOSIS — Z3202 Encounter for pregnancy test, result negative: Secondary | ICD-10-CM | POA: Insufficient documentation

## 2014-10-05 DIAGNOSIS — F419 Anxiety disorder, unspecified: Secondary | ICD-10-CM | POA: Diagnosis not present

## 2014-10-05 DIAGNOSIS — Z794 Long term (current) use of insulin: Secondary | ICD-10-CM | POA: Insufficient documentation

## 2014-10-05 DIAGNOSIS — J45909 Unspecified asthma, uncomplicated: Secondary | ICD-10-CM | POA: Diagnosis not present

## 2014-10-05 DIAGNOSIS — Z79899 Other long term (current) drug therapy: Secondary | ICD-10-CM | POA: Insufficient documentation

## 2014-10-05 DIAGNOSIS — M79605 Pain in left leg: Secondary | ICD-10-CM | POA: Insufficient documentation

## 2014-10-05 DIAGNOSIS — M25512 Pain in left shoulder: Secondary | ICD-10-CM | POA: Diagnosis not present

## 2014-10-05 DIAGNOSIS — R739 Hyperglycemia, unspecified: Secondary | ICD-10-CM

## 2014-10-05 DIAGNOSIS — I10 Essential (primary) hypertension: Secondary | ICD-10-CM | POA: Diagnosis not present

## 2014-10-05 DIAGNOSIS — Z7982 Long term (current) use of aspirin: Secondary | ICD-10-CM | POA: Insufficient documentation

## 2014-10-05 DIAGNOSIS — R079 Chest pain, unspecified: Secondary | ICD-10-CM | POA: Diagnosis present

## 2014-10-05 DIAGNOSIS — E1165 Type 2 diabetes mellitus with hyperglycemia: Secondary | ICD-10-CM | POA: Insufficient documentation

## 2014-10-05 LAB — COMPREHENSIVE METABOLIC PANEL
ALK PHOS: 64 U/L (ref 38–126)
ALT: 13 U/L — AB (ref 14–54)
ANION GAP: 7 (ref 5–15)
AST: 20 U/L (ref 15–41)
Albumin: 3.4 g/dL — ABNORMAL LOW (ref 3.5–5.0)
BUN: 13 mg/dL (ref 6–20)
CO2: 22 mmol/L (ref 22–32)
CREATININE: 0.81 mg/dL (ref 0.44–1.00)
Calcium: 8.1 mg/dL — ABNORMAL LOW (ref 8.9–10.3)
Chloride: 105 mmol/L (ref 101–111)
GLUCOSE: 392 mg/dL — AB (ref 65–99)
Potassium: 3.6 mmol/L (ref 3.5–5.1)
SODIUM: 134 mmol/L — AB (ref 135–145)
TOTAL PROTEIN: 7.8 g/dL (ref 6.5–8.1)
Total Bilirubin: 0.1 mg/dL — ABNORMAL LOW (ref 0.3–1.2)

## 2014-10-05 LAB — PREGNANCY, URINE: PREG TEST UR: NEGATIVE

## 2014-10-05 LAB — CBC WITH DIFFERENTIAL/PLATELET
BASOS PCT: 0 % (ref 0–1)
Basophils Absolute: 0 10*3/uL (ref 0.0–0.1)
EOS ABS: 0.1 10*3/uL (ref 0.0–0.7)
Eosinophils Relative: 2 % (ref 0–5)
HEMATOCRIT: 33.2 % — AB (ref 36.0–46.0)
Hemoglobin: 10.7 g/dL — ABNORMAL LOW (ref 12.0–15.0)
LYMPHS PCT: 30 % (ref 12–46)
Lymphs Abs: 2.1 10*3/uL (ref 0.7–4.0)
MCH: 24.7 pg — AB (ref 26.0–34.0)
MCHC: 32.2 g/dL (ref 30.0–36.0)
MCV: 76.5 fL — ABNORMAL LOW (ref 78.0–100.0)
Monocytes Absolute: 0.7 10*3/uL (ref 0.1–1.0)
Monocytes Relative: 10 % (ref 3–12)
NEUTROS ABS: 4.1 10*3/uL (ref 1.7–7.7)
Neutrophils Relative %: 58 % (ref 43–77)
Platelets: 285 10*3/uL (ref 150–400)
RBC: 4.34 MIL/uL (ref 3.87–5.11)
RDW: 14.9 % (ref 11.5–15.5)
WBC: 7 10*3/uL (ref 4.0–10.5)

## 2014-10-05 LAB — URINALYSIS, ROUTINE W REFLEX MICROSCOPIC
Bilirubin Urine: NEGATIVE
Glucose, UA: >1000 mg/dL — AB
HGB URINE DIPSTICK: NEGATIVE
KETONES UR: NEGATIVE mg/dL
Leukocytes, UA: NEGATIVE
NITRITE: NEGATIVE
PH: 5.5 (ref 5.0–8.0)
Protein, ur: 100 mg/dL — AB
Specific Gravity, Urine: 1.028 (ref 1.005–1.030)
Urobilinogen, UA: 0.2 mg/dL (ref 0.0–1.0)

## 2014-10-05 LAB — URINE MICROSCOPIC-ADD ON

## 2014-10-05 LAB — LIPASE, BLOOD: LIPASE: 30 U/L (ref 22–51)

## 2014-10-05 LAB — TROPONIN I

## 2014-10-05 MED ORDER — IBUPROFEN 800 MG PO TABS
800.0000 mg | ORAL_TABLET | Freq: Once | ORAL | Status: AC
  Administered 2014-10-05: 800 mg via ORAL
  Filled 2014-10-05: qty 1

## 2014-10-05 MED ORDER — HYDROCODONE-ACETAMINOPHEN 5-325 MG PO TABS
2.0000 | ORAL_TABLET | Freq: Once | ORAL | Status: AC
  Administered 2014-10-05: 2 via ORAL
  Filled 2014-10-05: qty 2

## 2014-10-05 NOTE — ED Notes (Signed)
Patient notified urine specimen needed.  Patient reports she cannot provide one at present and will notify staff when she is able.

## 2014-10-05 NOTE — ED Notes (Signed)
MD at bedside. 

## 2014-10-05 NOTE — ED Provider Notes (Signed)
TIME SEEN: 11:30 AM  CHIEF COMPLAINT: Left-sided pain  HPI: Pt is a 54 y.o. morbidly obese female with history of hypertension, diabetes, fibromyalgia who presents to the emergency department with complaints of left-sided body pain. Reports that 6 days ago she thinks she was bitten by mosquitoes and ever since has had left-sided body pain. No fever. No headache. She states she has noticed a rash which is now gone. No other known insect bite. No tick bite. No nausea, vomiting. Has had intermittent diarrhea. No numbness, tingling or focal weakness. No history of injury to the side of her body.  ROS: See HPI Constitutional: no fever  Eyes: no drainage  ENT: no runny nose   Cardiovascular:  no chest pain  Resp: no SOB  GI: no vomiting GU: no dysuria Integumentary: no rash  Allergy: no hives  Musculoskeletal: no leg swelling  Neurological: no slurred speech ROS otherwise negative  PAST MEDICAL HISTORY/PAST SURGICAL HISTORY:  Past Medical History  Diagnosis Date  . Hypertension   . Diabetes mellitus   . Fibromyalgia   . Depression   . Asthma   . Anxiety     MEDICATIONS:  Prior to Admission medications   Medication Sig Start Date End Date Taking? Authorizing Provider  albuterol (PROVENTIL HFA;VENTOLIN HFA) 108 (90 BASE) MCG/ACT inhaler Inhale 2 puffs into the lungs every 6 (six) hours as needed. For shortness of breath and wheezing     Historical Provider, MD  ALPRAZolam Duanne Moron) 1 MG tablet Take 1 mg by mouth 3 (three) times daily as needed. For anxiety      Historical Provider, MD  aspirin 325 MG EC tablet Take 325 mg by mouth daily.      Historical Provider, MD  azithromycin (ZITHROMAX) 250 MG tablet Take 1 tablet (250 mg total) by mouth daily. Take first 2 tablets together, then 1 every day until finished. 06/29/14   Malvin Johns, MD  BuPROPion HCl (WELLBUTRIN XL PO) Take 1 tablet by mouth daily.      Historical Provider, MD  esomeprazole (NEXIUM) 40 MG capsule Take 40 mg by mouth  daily before breakfast.      Historical Provider, MD  FLUoxetine (PROZAC) 40 MG capsule Take 80 mg by mouth daily.      Historical Provider, MD  Fluticasone-Salmeterol (ADVAIR) 500-50 MCG/DOSE AEPB Inhale 1 puff into the lungs every 12 (twelve) hours.      Historical Provider, MD  glipiZIDE-metformin (METAGLIP) 5-500 MG per tablet Take 1 tablet by mouth daily.      Historical Provider, MD  HYDROcodone-homatropine (HYCODAN) 5-1.5 MG/5ML syrup Take 5 mLs by mouth every 6 (six) hours as needed for cough. 06/29/14   Malvin Johns, MD  insulin aspart (NOVOLOG) 100 UNIT/ML injection Inject into the skin 3 (three) times daily before meals. Per sliding scale    Historical Provider, MD  insulin glargine (LANTUS) 100 UNIT/ML injection Inject 20 Units into the skin at bedtime.      Historical Provider, MD  mometasone (NASONEX) 50 MCG/ACT nasal spray Place 2 sprays into both nostrils daily.      Historical Provider, MD  oxycodone (OXY-IR) 5 MG capsule Take 5 mg by mouth every 4 (four) hours as needed. For pain     Historical Provider, MD  zolpidem (AMBIEN) 10 MG tablet Take 10 mg by mouth at bedtime.      Historical Provider, MD    ALLERGIES:  Allergies  Allergen Reactions  . Oxycodone-Aspirin     SOCIAL HISTORY:  History  Substance Use Topics  . Smoking status: Never Smoker   . Smokeless tobacco: Not on file  . Alcohol Use: No    FAMILY HISTORY: History reviewed. No pertinent family history.  EXAM: BP 166/86 mmHg  Pulse 100  Temp(Src) 98.4 F (36.9 C) (Oral)  Resp 20  Ht 5\' 9"  (1.753 m)  Wt 295 lb (133.811 kg)  BMI 43.54 kg/m2  SpO2 96%  LMP 09/07/2014 CONSTITUTIONAL: Alert and oriented and responds appropriately to questions. Appears uncomfortable but is nontoxic, afebrile HEAD: Normocephalic EYES: Conjunctivae clear, PERRL ENT: normal nose; no rhinorrhea; moist mucous membranes; pharynx without lesions noted NECK: Supple, no meningismus, no LAD  CARD: RRR; S1 and S2 appreciated; no  murmurs, no clicks, no rubs, no gallops; patient is tender to palpation over the left chest wall RESP: Normal chest excursion without splinting or tachypnea; breath sounds clear and equal bilaterally; no wheezes, no rhonchi, no rales, no hypoxia or respiratory distress, speaking full sentences ABD/GI: Normal bowel sounds; non-distended; soft, non-tender, no rebound, no guarding, no peritoneal signs BACK:  The back appears normal and is non-tender to palpation, there is no CVA tenderness EXT: Tender to palpation throughout the entire left upper and lower extremity without obvious bony deformity, no joint effusion, Orbits are soft, no erythema or warmth, no induration, Normal ROM in all joints; right upper and lower extremity are non-tender to palpation; no edema; normal capillary refill; no cyanosis, no calf tenderness or swelling    SKIN: Normal color for age and race; warm NEURO: Moves all extremities equally, sensation to light touch intact diffusely, cranial nerves II through XII intact, normal gait PSYCH: The patient's mood and manner are appropriate. Grooming and personal hygiene are appropriate.  MEDICAL DECISION MAKING: Patient here with vague complaints of left-sided body pain. She has a lot of pain in her left shoulder with minimal movement and has a history of rotator cuff tendinitis. Suspect this may be the cause of her right-sided arm pain. She is also tender throughout her right chest wall. Unclear etiology for her right leg pain. She is neurologically intact however. No infectious symptoms. We'll obtain labs, urine, chest x-ray, left shoulder x-ray. We'll give Vicodin for pain as well as ibuprofen.  ED PROGRESS: Patient's labs are unremarkable today other than hyperglycemia. She is not in DKA. She is on insulin states she has insulin at home. Troponin negative. Urine shows no sign of infection and no ketones. Urine pregnancy is negative. Chest x-ray clear. X-ray of the left shoulder shows no  acute abnormality. It appears patient may be here because she has recently been out of her Percocet 10 mg at home. I do not feel is appropriate to refill her chronic pain medication in the ED. I do not feel there is any life-threatening illness present and feel she is safe to be discharged home with outpatient follow-up. Discussed return precautions. She verbalized understanding and is comfortable with plan.    EKG Interpretation  Date/Time:  Sunday October 05 2014 11:44:15 EDT Ventricular Rate:  101 PR Interval:  188 QRS Duration: 80 QT Interval:  352 QTC Calculation: 456 R Axis:   -20 Text Interpretation:  Sinus tachycardia Cannot rule out Anterior infarct , age undetermined Abnormal ECG No significant change since last tracing Confirmed by WARD,  DO, KRISTEN [phone] on 10/05/2014 12:34:50 PM        [place], DO 10/05/14 1538

## 2014-10-05 NOTE — Discharge Instructions (Signed)
Chest Pain (Nonspecific) °It is often hard to give a specific diagnosis for the cause of chest pain. There is always a chance that your pain could be related to something serious, such as a heart attack or a blood clot in the lungs. You need to follow up with your health care provider for further evaluation. °CAUSES  °· Heartburn. °· Pneumonia or bronchitis. °· Anxiety or stress. °· Inflammation around your heart (pericarditis) or lung (pleuritis or pleurisy). °· A blood clot in the lung. °· A collapsed lung (pneumothorax). It can develop suddenly on its own (spontaneous pneumothorax) or from trauma to the chest. °· Shingles infection (herpes zoster virus). °The chest wall is composed of bones, muscles, and cartilage. Any of these can be the source of the pain. °· The bones can be bruised by injury. °· The muscles or cartilage can be strained by coughing or overwork. °· The cartilage can be affected by inflammation and become sore (costochondritis). °DIAGNOSIS  °Lab tests or other studies may be needed to find the cause of your pain. Your health care provider may have you take a test called an ambulatory electrocardiogram (ECG). An ECG records your heartbeat patterns over a 24-hour period. You may also have other tests, such as: °· Transthoracic echocardiogram (TTE). During echocardiography, sound waves are used to evaluate how blood flows through your heart. °· Transesophageal echocardiogram (TEE). °· Cardiac monitoring. This allows your health care provider to monitor your heart rate and rhythm in real time. °· Holter monitor. This is a portable device that records your heartbeat and can help diagnose heart arrhythmias. It allows your health care provider to track your heart activity for several days, if needed. °· Stress tests by exercise or by giving medicine that makes the heart beat faster. °TREATMENT  °· Treatment depends on what may be causing your chest pain. Treatment may include: °· Acid blockers for  heartburn. °· Anti-inflammatory medicine. °· Pain medicine for inflammatory conditions. °· Antibiotics if an infection is present. °· You may be advised to change lifestyle habits. This includes stopping smoking and avoiding alcohol, caffeine, and chocolate. °· You may be advised to keep your head raised (elevated) when sleeping. This reduces the chance of acid going backward from your stomach into your esophagus. °Most of the time, nonspecific chest pain will improve within 2-3 days with rest and mild pain medicine.  °HOME CARE INSTRUCTIONS  °· If antibiotics were prescribed, take them as directed. Finish them even if you start to feel better. °· For the next few days, avoid physical activities that bring on chest pain. Continue physical activities as directed. °· Do not use any tobacco products, including cigarettes, chewing tobacco, or electronic cigarettes. °· Avoid drinking alcohol. °· Only take medicine as directed by your health care provider. °· Follow your health care provider's suggestions for further testing if your chest pain does not go away. °· Keep any follow-up appointments you made. If you do not go to an appointment, you could develop lasting (chronic) problems with pain. If there is any problem keeping an appointment, call to reschedule. °SEEK MEDICAL CARE IF:  °· Your chest pain does not go away, even after treatment. °· You have a rash with blisters on your chest. °· You have a fever. °SEEK IMMEDIATE MEDICAL CARE IF:  °· You have increased chest pain or pain that spreads to your arm, neck, jaw, back, or abdomen. °· You have shortness of breath. °· You have an increasing cough, or you cough   up blood.  You have severe back or abdominal pain.  You feel nauseous or vomit.  You have severe weakness.  You faint.  You have chills. This is an emergency. Do not wait to see if the pain will go away. Get medical help at once. Call your local emergency services (911 in [place] Do not drive  yourself to the hospital. MAKE SURE YOU:   Understand these instructions.  Will watch your condition.  Will get help right away if you are not doing well or get worse. Document Released: 01/19/2005 Document Revised: 04/16/2013 Document Reviewed: 11/15/2007 [facility] Patient Information 2015 [place], [place]. This information is not intended to replace advice given to you by your health care provider. Make sure you discuss any questions you have with your health care provider.   Rotator Cuff Tendinitis  Rotator cuff tendinitis is inflammation of the tough, cord-like bands that connect muscle to bone (tendons) in your rotator cuff. Your rotator cuff is the collection of all the muscles and tendons that connect your arm to your shoulder. Your rotator cuff holds the head of your upper arm bone (humerus) in the cup (fossa) of your shoulder blade (scapula). CAUSES Rotator cuff tendinitis is usually caused by overusing the joint involved.  SIGNS AND SYMPTOMS  Deep ache in the shoulder also felt on the outside upper arm over the shoulder muscle.  Point tenderness over the area that is injured.  Pain comes on gradually and becomes worse with lifting the arm to the side (abduction) or turning it inward (internal rotation).  May lead to a chronic tear: When a rotator cuff tendon becomes inflamed, it runs the risk of losing its blood supply, causing some tendon fibers to die. This increases the risk that the tendon can fray and partially or completely tear. DIAGNOSIS Rotator cuff tendinitis is diagnosed by taking a medical history, performing a physical exam, and reviewing results of imaging exams. The medical history is useful to help determine the type of rotator cuff injury. The physical exam will include looking at the injured shoulder, feeling the injured area, and watching you do range-of-motion exercises. X-ray exams are typically done to rule out other causes of shoulder pain, such as fractures.  MRI is the imaging exam usually used for significant shoulder injuries. Sometimes a dye study called CT arthrogram is done, but it is not as widely used as MRI. In some institutions, special ultrasound tests may also be used to aid in the diagnosis. TREATMENT  Less Severe Cases  Use of a sling to rest the shoulder for a short period of time. Prolonged use of the sling can cause stiffness, weakness, and loss of motion of the shoulder joint.  Anti-inflammatory medicines, such as ibuprofen or naproxen sodium, may be prescribed. More Severe Cases  Physical therapy.  Use of steroid injections into the shoulder joint.  Surgery. HOME CARE INSTRUCTIONS   Use a sling or splint until the pain decreases. Prolonged use of the sling can cause stiffness, weakness, and loss of motion of the shoulder joint.  Apply ice to the injured area:  Put ice in a plastic bag.  Place a towel between your skin and the bag.  Leave the ice on for 20 minutes, 2-3 times a day.  Try to avoid use other than gentle range of motion while your shoulder is painful. Use the shoulder and exercise only as directed by your health care provider. Stop exercises or range of motion if pain or discomfort increases, unless directed  otherwise by your health care provider.  Only take over-the-counter or prescription medicines for pain, discomfort, or fever as directed by your health care provider.  If you were given a shoulder sling and straps (immobilizer), do not remove it except as directed, or until you see a health care provider for a follow-up exam. If you need to remove it, move your arm as little as possible or as directed.  You may want to sleep on several pillows at night to lessen swelling and pain. SEEK IMMEDIATE MEDICAL CARE IF:   Your shoulder pain increases or new pain develops in your arm, hand, or fingers and is not relieved with medicines.  You have new, unexplained symptoms, especially increased numbness in the  hands or loss of strength.  You develop any worsening of the problems that brought you in for care.  Your arm, hand, or fingers are numb or tingling.  Your arm, hand, or fingers are swollen, painful, or turn white or blue. MAKE SURE YOU:  Understand these instructions.  Will watch your condition.  Will get help right away if you are not doing well or get worse. Document Released: 07/02/2003 Document Revised: 01/30/2013 Document Reviewed: 11/21/2012 [facility] Patient Information 2015 [place], [place]. This information is not intended to replace advice given to you by your health care provider. Make sure you discuss any questions you have with your health care provider.   Hyperglycemia Hyperglycemia occurs when the glucose (sugar) in your blood is too high. Hyperglycemia can happen for many reasons, but it most often happens to people who do not know they have diabetes or are not managing their diabetes properly.  CAUSES  Whether you have diabetes or not, there are other causes of hyperglycemia. Hyperglycemia can occur when you have diabetes, but it can also occur in other situations that you might not be as aware of, such as: Diabetes  If you have diabetes and are having problems controlling your blood glucose, hyperglycemia could occur because of some of the following reasons:  Not following your meal plan.  Not taking your diabetes medications or not taking it properly.  Exercising less or doing less activity than you normally do.  Being sick. Pre-diabetes  This cannot be ignored. Before people develop Type 2 diabetes, they almost always have "pre-diabetes." This is when your blood glucose levels are higher than normal, but not yet high enough to be diagnosed as diabetes. Research has shown that some long-term damage to the body, especially the heart and circulatory system, may already be occurring during pre-diabetes. If you take action to manage your blood glucose when you have  pre-diabetes, you may delay or prevent Type 2 diabetes from developing. Stress  If you have diabetes, you may be "diet" controlled or on oral medications or insulin to control your diabetes. However, you may find that your blood glucose is higher than usual in the hospital whether you have diabetes or not. This is often referred to as "stress hyperglycemia." Stress can elevate your blood glucose. This happens because of hormones put out by the body during times of stress. If stress has been the cause of your high blood glucose, it can be followed regularly by your caregiver. That way he/she can make sure your hyperglycemia does not continue to get worse or progress to diabetes. Steroids  Steroids are medications that act on the infection fighting system (immune system) to block inflammation or infection. One side effect can be a rise in blood glucose. Most people can  produce enough extra insulin to allow for this rise, but for those who cannot, steroids make blood glucose levels go even higher. It is not unusual for steroid treatments to "uncover" diabetes that is developing. It is not always possible to determine if the hyperglycemia will go away after the steroids are stopped. A special blood test called an A1c is sometimes done to determine if your blood glucose was elevated before the steroids were started. SYMPTOMS  Thirsty.  Frequent urination.  Dry mouth.  Blurred vision.  Tired or fatigue.  Weakness.  Sleepy.  Tingling in feet or leg. DIAGNOSIS  Diagnosis is made by monitoring blood glucose in one or all of the following ways:  A1c test. This is a chemical found in your blood.  Fingerstick blood glucose monitoring.  Laboratory results. TREATMENT  First, knowing the cause of the hyperglycemia is important before the hyperglycemia can be treated. Treatment may include, but is not be limited to:  Education.  Change or adjustment in medications.  Change or adjustment in  meal plan.  Treatment for an illness, infection, etc.  More frequent blood glucose monitoring.  Change in exercise plan.  Decreasing or stopping steroids.  Lifestyle changes. HOME CARE INSTRUCTIONS   Test your blood glucose as directed.  Exercise regularly. Your caregiver will give you instructions about exercise. Pre-diabetes or diabetes which comes on with stress is helped by exercising.  Eat wholesome, balanced meals. Eat often and at regular, fixed times. Your caregiver or nutritionist will give you a meal plan to guide your sugar intake.  Being at an ideal weight is important. If needed, losing as little as 10 to 15 pounds may help improve blood glucose levels. SEEK MEDICAL CARE IF:   You have questions about medicine, activity, or diet.  You continue to have symptoms (problems such as increased thirst, urination, or weight gain). SEEK IMMEDIATE MEDICAL CARE IF:   You are vomiting or have diarrhea.  Your breath smells fruity.  You are breathing faster or slower.  You are very sleepy or incoherent.  You have numbness, tingling, or pain in your feet or hands.  You have chest pain.  Your symptoms get worse even though you have been following your caregiver's orders.  If you have any other questions or concerns. Document Released: 10/05/2000 Document Revised: 07/04/2011 Document Reviewed: 08/08/2011 [facility] Patient Information 2015 [place], [place]. This information is not intended to replace advice given to you by your health care provider. Make sure you discuss any questions you have with your health care provider.

## 2014-10-05 NOTE — ED Notes (Signed)
Patient reports "this medication is not going to work for me".  Asked patient why.  Patient states "I have a high tolerance for pain medication because I have fibromyalgia.  Asked patient what it is that works for her.  Patient states "when I come here they give me morphine and dilaudid because they know I have a high pain tolerance.

## 2014-10-05 NOTE — ED Notes (Signed)
Patient presents with multiple complaints.  Patient reports left sided pain.  Patient reports being bit by a mosquito last Monday and reports having a rash "every which way".  Reports this was on her hands and called PCP who told her to get OTC medication.  Reports "from there I have been going downhill feeling bad ever since".  Reports diarrhea.  Reports she took benadryl, calamine, hydroxyzine and "some bug stuff". Reports "I'm itching and the bumps are still there, nothing is helping and I don't know if I had a reaction to that or what the case may be".

## 2014-10-05 NOTE — ED Notes (Signed)
Pt in xray

## 2014-11-11 ENCOUNTER — Encounter (HOSPITAL_BASED_OUTPATIENT_CLINIC_OR_DEPARTMENT_OTHER): Payer: Self-pay

## 2014-11-11 ENCOUNTER — Emergency Department (HOSPITAL_BASED_OUTPATIENT_CLINIC_OR_DEPARTMENT_OTHER)
Admission: EM | Admit: 2014-11-11 | Discharge: 2014-11-11 | Discharge status: Against Medical Advice | Payer: Medicaid Other | Attending: Emergency Medicine | Admitting: Emergency Medicine

## 2014-11-11 DIAGNOSIS — G8929 Other chronic pain: Secondary | ICD-10-CM | POA: Diagnosis not present

## 2014-11-11 DIAGNOSIS — F419 Anxiety disorder, unspecified: Secondary | ICD-10-CM | POA: Diagnosis not present

## 2014-11-11 DIAGNOSIS — Z794 Long term (current) use of insulin: Secondary | ICD-10-CM | POA: Insufficient documentation

## 2014-11-11 DIAGNOSIS — Z79899 Other long term (current) drug therapy: Secondary | ICD-10-CM | POA: Insufficient documentation

## 2014-11-11 DIAGNOSIS — F329 Major depressive disorder, single episode, unspecified: Secondary | ICD-10-CM | POA: Diagnosis not present

## 2014-11-11 DIAGNOSIS — Z86018 Personal history of other benign neoplasm: Secondary | ICD-10-CM | POA: Insufficient documentation

## 2014-11-11 DIAGNOSIS — R109 Unspecified abdominal pain: Secondary | ICD-10-CM | POA: Diagnosis not present

## 2014-11-11 DIAGNOSIS — M5441 Lumbago with sciatica, right side: Secondary | ICD-10-CM | POA: Diagnosis not present

## 2014-11-11 DIAGNOSIS — E669 Obesity, unspecified: Secondary | ICD-10-CM | POA: Insufficient documentation

## 2014-11-11 DIAGNOSIS — I10 Essential (primary) hypertension: Secondary | ICD-10-CM | POA: Insufficient documentation

## 2014-11-11 DIAGNOSIS — E119 Type 2 diabetes mellitus without complications: Secondary | ICD-10-CM | POA: Insufficient documentation

## 2014-11-11 DIAGNOSIS — J45909 Unspecified asthma, uncomplicated: Secondary | ICD-10-CM | POA: Diagnosis not present

## 2014-11-11 DIAGNOSIS — R110 Nausea: Secondary | ICD-10-CM | POA: Insufficient documentation

## 2014-11-11 DIAGNOSIS — Z3202 Encounter for pregnancy test, result negative: Secondary | ICD-10-CM | POA: Insufficient documentation

## 2014-11-11 DIAGNOSIS — Z792 Long term (current) use of antibiotics: Secondary | ICD-10-CM | POA: Diagnosis not present

## 2014-11-11 DIAGNOSIS — M549 Dorsalgia, unspecified: Secondary | ICD-10-CM | POA: Diagnosis present

## 2014-11-11 DIAGNOSIS — Z7982 Long term (current) use of aspirin: Secondary | ICD-10-CM | POA: Insufficient documentation

## 2014-11-11 LAB — URINALYSIS, ROUTINE W REFLEX MICROSCOPIC
BILIRUBIN URINE: NEGATIVE
Hgb urine dipstick: NEGATIVE
Ketones, ur: NEGATIVE mg/dL
Leukocytes, UA: NEGATIVE
Nitrite: NEGATIVE
PH: 5.5 (ref 5.0–8.0)
Protein, ur: 30 mg/dL — AB
Specific Gravity, Urine: 1.021 (ref 1.005–1.030)
UROBILINOGEN UA: 0.2 mg/dL (ref 0.0–1.0)

## 2014-11-11 LAB — URINE MICROSCOPIC-ADD ON

## 2014-11-11 LAB — PREGNANCY, URINE: Preg Test, Ur: NEGATIVE

## 2014-11-11 LAB — CBG MONITORING, ED: GLUCOSE-CAPILLARY: 375 mg/dL — AB (ref 65–99)

## 2014-11-11 MED ORDER — CYCLOBENZAPRINE HCL 10 MG PO TABS
5.0000 mg | ORAL_TABLET | Freq: Once | ORAL | Status: AC
  Administered 2014-11-11: 5 mg via ORAL
  Filled 2014-11-11: qty 1

## 2014-11-11 MED ORDER — METAXALONE 400 MG HALF TABLET
400.0000 mg | ORAL_TABLET | Freq: Once | ORAL | Status: DC
  Filled 2014-11-11: qty 1

## 2014-11-11 MED ORDER — OXYCODONE-ACETAMINOPHEN 5-325 MG PO TABS
2.0000 | ORAL_TABLET | Freq: Once | ORAL | Status: AC
  Administered 2014-11-11: 2 via ORAL
  Filled 2014-11-11: qty 2

## 2014-11-11 MED ORDER — KETOROLAC TROMETHAMINE 30 MG/ML IJ SOLN
60.0000 mg | Freq: Once | INTRAMUSCULAR | Status: AC
  Administered 2014-11-11: 60 mg via INTRAMUSCULAR
  Filled 2014-11-11: qty 2

## 2014-11-11 NOTE — ED Provider Notes (Signed)
CSN: [id number]     Arrival date & time 11/11/14  1820 History   This chart was scribed for Judy Belling, MD by Irene Pap, ED Scribe. This patient was seen in room MH09/MH09 and patient care was started at [facility].    Chief Complaint  Patient presents with  . Flank Pain   The history is provided by the patient. No language interpreter was used.  HPI Comments: Judy English is a 54 y.o. female with hx of DM, HTN, and fibromyalgia who presents to the Emergency Department complaining of cramping right flank and back pain onset 3 days ago. States that the pain radiates down her right leg and around to the right side of her lower stomach and reports associated nausea. Reports worsening pain with movement and to palpation. Reports taking ibuprofen and prescribed pain medication to no relief. She reports history of fibroid tumors and states that her DM is not well controlled; states that she is supposed to have surgery for the tumors but has to get her DM under control. States that she has not had a menstrual period in a few months. Denies history of pain similar to this with her fibroids. Denies dysuria, hematuria, frequency, urgency, fever, chills, vomiting or diarrhea.  Past Medical History  Diagnosis Date  . Hypertension   . Diabetes mellitus   . Fibromyalgia   . Depression   . Asthma   . Anxiety    Past Surgical History  Procedure Laterality Date  . Uterine fibroid surgery    . Cesarean section     No family history on file. History  Substance Use Topics  . Smoking status: Never Smoker   . Smokeless tobacco: Not on file  . Alcohol Use: No   OB History    No data available     Review of Systems  Constitutional: Negative for fever and chills.  Gastrointestinal: Positive for nausea and abdominal pain.  Genitourinary: Positive for flank pain. Negative for dysuria, urgency, frequency and hematuria.  Musculoskeletal: Positive for back pain and arthralgias.  All other  systems reviewed and are negative.  Allergies  Oxycodone-aspirin  Home Medications   Prior to Admission medications   Medication Sig Start Date End Date Taking? Authorizing Provider  albuterol (PROVENTIL HFA;VENTOLIN HFA) 108 (90 BASE) MCG/ACT inhaler Inhale 2 puffs into the lungs every 6 (six) hours as needed. For shortness of breath and wheezing     Historical Provider, MD  ALPRAZolam Duanne Moron) 1 MG tablet Take 1 mg by mouth 3 (three) times daily as needed. For anxiety      Historical Provider, MD  aspirin 325 MG EC tablet Take 325 mg by mouth daily.      Historical Provider, MD  azithromycin (ZITHROMAX) 250 MG tablet Take 1 tablet (250 mg total) by mouth daily. Take first 2 tablets together, then 1 every day until finished. 06/29/14   Malvin Johns, MD  BuPROPion HCl (WELLBUTRIN XL PO) Take 1 tablet by mouth daily.      Historical Provider, MD  esomeprazole (NEXIUM) 40 MG capsule Take 40 mg by mouth daily before breakfast.      Historical Provider, MD  FLUoxetine (PROZAC) 40 MG capsule Take 80 mg by mouth daily.      Historical Provider, MD  Fluticasone-Salmeterol (ADVAIR) 500-50 MCG/DOSE AEPB Inhale 1 puff into the lungs every 12 (twelve) hours.      Historical Provider, MD  glipiZIDE-metformin (METAGLIP) 5-500 MG per tablet Take 1 tablet by mouth daily.  Historical Provider, MD  HYDROcodone-homatropine (HYCODAN) 5-1.5 MG/5ML syrup Take 5 mLs by mouth every 6 (six) hours as needed for cough. 06/29/14   Malvin Johns, MD  insulin aspart (NOVOLOG) 100 UNIT/ML injection Inject into the skin 3 (three) times daily before meals. Per sliding scale    Historical Provider, MD  insulin glargine (LANTUS) 100 UNIT/ML injection Inject 20 Units into the skin at bedtime.      Historical Provider, MD  mometasone (NASONEX) 50 MCG/ACT nasal spray Place 2 sprays into both nostrils daily.      Historical Provider, MD  oxycodone (OXY-IR) 5 MG capsule Take 5 mg by mouth every 4 (four) hours as needed. For pain      Historical Provider, MD  zolpidem (AMBIEN) 10 MG tablet Take 10 mg by mouth at bedtime.      Historical Provider, MD   BP 149/95 mmHg  Pulse 96  Temp(Src) 98.7 F (37.1 C) (Oral)  Resp 18  Ht 5\' 8"  (1.727 m)  Wt 298 lb (135.172 kg)  BMI 45.32 kg/m2  SpO2 95%  LMP 09/02/2014  Physical Exam  Constitutional: She is oriented to person, place, and time. She appears well-developed and well-nourished.  obese  HENT:  Head: Normocephalic and atraumatic.  Eyes: EOM are normal. Pupils are equal, round, and reactive to light.  Neck: Normal range of motion. Neck supple.  Cardiovascular: Normal rate and regular rhythm.   Pulmonary/Chest: Effort normal and breath sounds normal.  Abdominal: Soft. There is no tenderness.  Musculoskeletal: Normal range of motion. She exhibits tenderness.  Tenderness to right lower back and SI area, NVI over feet bilaterally; some pain radiating down right leg with flexion and extension of hip  Neurological: She is alert and oriented to person, place, and time.  Skin: Skin is warm and dry.  Psychiatric: She has a normal mood and affect. Her behavior is normal.  Nursing note and vitals reviewed.   ED Course  Procedures (including critical care time) DIAGNOSTIC STUDIES: Oxygen Saturation is 100% on RA, normal by my interpretation.    COORDINATION OF CARE: 6:45 PM-Discussed treatment plan which includes UA with pt at bedside and pt agreed to plan.   Labs Review Labs Reviewed  URINALYSIS, ROUTINE W REFLEX MICROSCOPIC (NOT AT [facility] - Abnormal; Notable for the following:    Glucose, UA >1000 (*)    Protein, ur 30 (*)    All other components within normal limits  CBG MONITORING, ED - Abnormal; Notable for the following:    Glucose-Capillary 375 (*)    All other components within normal limits  PREGNANCY, URINE  URINE MICROSCOPIC-ADD ON   Imaging Review No results found.   EKG Interpretation None      MDM   Final diagnoses:  Right-sided low back  pain with right-sided sciatica    Patient with back pain. Has had some chronic pain. Sugars elevated but treats at home. On chronic pain meds at home. Patient left the ER after she had not gotten stronger pain meds in the ER. I personally performed the services described in this documentation, which was scribed in my presence. The recorded information has been reviewed and is accurate.     Judy Belling, MD 11/12/14 2128

## 2014-11-11 NOTE — ED Notes (Signed)
Pt assisted to restroom via wheelchair in attempt to obtain urine specimen.

## 2014-11-11 NOTE — ED Notes (Signed)
Pt agitated, states her son has been waiting to take her home and no discharge instructions have been given to her - pt informed she had not yet been discharged by physician, pt continues to request to leave w/o discharge instructions. Pt assisted by this RN via wheelchair to car. Dr. Alvino Chapel, EDP made aware.

## 2014-11-11 NOTE — ED Notes (Signed)
Pt reports right flank/back pain x 3 days. Reports pain radiating down right leg. Denies urinary symptoms. Reports nausea

## 2015-01-08 ENCOUNTER — Encounter (HOSPITAL_BASED_OUTPATIENT_CLINIC_OR_DEPARTMENT_OTHER): Payer: Self-pay

## 2015-01-08 ENCOUNTER — Emergency Department (HOSPITAL_BASED_OUTPATIENT_CLINIC_OR_DEPARTMENT_OTHER)
Admission: EM | Admit: 2015-01-08 | Discharge: 2015-01-08 | Disposition: A | Discharge status: Home / Self Care | Payer: Medicaid Other | Attending: Emergency Medicine | Admitting: Emergency Medicine

## 2015-01-08 ENCOUNTER — Emergency Department (HOSPITAL_BASED_OUTPATIENT_CLINIC_OR_DEPARTMENT_OTHER): Payer: Medicaid Other

## 2015-01-08 DIAGNOSIS — I10 Essential (primary) hypertension: Secondary | ICD-10-CM | POA: Insufficient documentation

## 2015-01-08 DIAGNOSIS — M797 Fibromyalgia: Secondary | ICD-10-CM | POA: Insufficient documentation

## 2015-01-08 DIAGNOSIS — Z7951 Long term (current) use of inhaled steroids: Secondary | ICD-10-CM | POA: Diagnosis not present

## 2015-01-08 DIAGNOSIS — F329 Major depressive disorder, single episode, unspecified: Secondary | ICD-10-CM | POA: Insufficient documentation

## 2015-01-08 DIAGNOSIS — R197 Diarrhea, unspecified: Secondary | ICD-10-CM | POA: Diagnosis not present

## 2015-01-08 DIAGNOSIS — D259 Leiomyoma of uterus, unspecified: Secondary | ICD-10-CM | POA: Diagnosis not present

## 2015-01-08 DIAGNOSIS — Z79899 Other long term (current) drug therapy: Secondary | ICD-10-CM | POA: Insufficient documentation

## 2015-01-08 DIAGNOSIS — Z3202 Encounter for pregnancy test, result negative: Secondary | ICD-10-CM | POA: Insufficient documentation

## 2015-01-08 DIAGNOSIS — R1084 Generalized abdominal pain: Secondary | ICD-10-CM | POA: Diagnosis present

## 2015-01-08 DIAGNOSIS — J45909 Unspecified asthma, uncomplicated: Secondary | ICD-10-CM | POA: Insufficient documentation

## 2015-01-08 DIAGNOSIS — R112 Nausea with vomiting, unspecified: Secondary | ICD-10-CM | POA: Insufficient documentation

## 2015-01-08 DIAGNOSIS — Z7982 Long term (current) use of aspirin: Secondary | ICD-10-CM | POA: Insufficient documentation

## 2015-01-08 DIAGNOSIS — Z794 Long term (current) use of insulin: Secondary | ICD-10-CM | POA: Diagnosis not present

## 2015-01-08 DIAGNOSIS — E119 Type 2 diabetes mellitus without complications: Secondary | ICD-10-CM | POA: Insufficient documentation

## 2015-01-08 DIAGNOSIS — F419 Anxiety disorder, unspecified: Secondary | ICD-10-CM | POA: Diagnosis not present

## 2015-01-08 DIAGNOSIS — R1110 Vomiting, unspecified: Secondary | ICD-10-CM

## 2015-01-08 LAB — URINALYSIS, ROUTINE W REFLEX MICROSCOPIC
Bilirubin Urine: NEGATIVE
Glucose, UA: NEGATIVE mg/dL
Ketones, ur: NEGATIVE mg/dL
LEUKOCYTES UA: NEGATIVE
NITRITE: NEGATIVE
Protein, ur: 100 mg/dL — AB
Specific Gravity, Urine: 1.018 (ref 1.005–1.030)
UROBILINOGEN UA: 0.2 mg/dL (ref 0.0–1.0)
pH: 5.5 (ref 5.0–8.0)

## 2015-01-08 LAB — COMPREHENSIVE METABOLIC PANEL
ALBUMIN: 3.6 g/dL (ref 3.5–5.0)
ALK PHOS: 75 U/L (ref 38–126)
ALT: 40 U/L (ref 14–54)
ANION GAP: 8 (ref 5–15)
AST: 56 U/L — ABNORMAL HIGH (ref 15–41)
BILIRUBIN TOTAL: 0.2 mg/dL — AB (ref 0.3–1.2)
BUN: 15 mg/dL (ref 6–20)
CALCIUM: 8.3 mg/dL — AB (ref 8.9–10.3)
CO2: 24 mmol/L (ref 22–32)
Chloride: 105 mmol/L (ref 101–111)
Creatinine, Ser: 0.7 mg/dL (ref 0.44–1.00)
GFR calc Af Amer: >60 mL/min (ref >60)
GFR calc non Af Amer: >60 mL/min (ref >60)
GLUCOSE: 188 mg/dL — AB (ref 65–99)
Potassium: 3.4 mmol/L — ABNORMAL LOW (ref 3.5–5.1)
Sodium: 137 mmol/L (ref 135–145)
TOTAL PROTEIN: 8 g/dL (ref 6.5–8.1)

## 2015-01-08 LAB — CBC
HCT: 37.2 % (ref 36.0–46.0)
Hemoglobin: 12 g/dL (ref 12.0–15.0)
MCH: 24.6 pg — AB (ref 26.0–34.0)
MCHC: 32.3 g/dL (ref 30.0–36.0)
MCV: 76.2 fL — ABNORMAL LOW (ref 78.0–100.0)
Platelets: 276 10*3/uL (ref 150–400)
RBC: 4.88 MIL/uL (ref 3.87–5.11)
RDW: 15.7 % — AB (ref 11.5–15.5)
WBC: 7.1 10*3/uL (ref 4.0–10.5)

## 2015-01-08 LAB — PREGNANCY, URINE: Preg Test, Ur: NEGATIVE

## 2015-01-08 LAB — URINE MICROSCOPIC-ADD ON

## 2015-01-08 MED ORDER — ONDANSETRON HCL 4 MG/2ML IJ SOLN
4.0000 mg | Freq: Once | INTRAMUSCULAR | Status: AC
  Administered 2015-01-08: 4 mg via INTRAVENOUS
  Filled 2015-01-08: qty 2

## 2015-01-08 MED ORDER — MORPHINE SULFATE (PF) 4 MG/ML IV SOLN
4.0000 mg | Freq: Once | INTRAVENOUS | Status: AC
  Administered 2015-01-08: 4 mg via INTRAVENOUS
  Filled 2015-01-08: qty 1

## 2015-01-08 MED ORDER — SODIUM CHLORIDE 0.9 % IV BOLUS (SEPSIS)
1000.0000 mL | Freq: Once | INTRAVENOUS | Status: AC
  Administered 2015-01-08: 1000 mL via INTRAVENOUS

## 2015-01-08 MED ORDER — PANTOPRAZOLE SODIUM 40 MG IV SOLR
40.0000 mg | Freq: Once | INTRAVENOUS | Status: AC
  Administered 2015-01-08: 40 mg via INTRAVENOUS
  Filled 2015-01-08: qty 40

## 2015-01-08 MED ORDER — IOHEXOL 300 MG/ML  SOLN
100.0000 mL | Freq: Once | INTRAMUSCULAR | Status: AC | PRN
  Administered 2015-01-08: 100 mL via INTRAVENOUS

## 2015-01-08 NOTE — ED Notes (Signed)
Patient transported to CT 

## 2015-01-08 NOTE — Discharge Instructions (Signed)

## 2015-01-08 NOTE — ED Notes (Signed)
MD at bedside. 

## 2015-01-08 NOTE — ED Notes (Signed)
C/o abd pain, n/v/d started yesterday

## 2015-01-08 NOTE — ED Notes (Signed)
Attempt to call report nurse not available 

## 2015-01-08 NOTE — ED Provider Notes (Signed)
CSN: [id number]     Arrival date & time 01/08/15  1646 History   First MD Initiated Contact with Patient 01/08/15 1709     Chief Complaint  Patient presents with  . Abdominal Pain    Patient is a 54 y.o. female presenting with abdominal pain. The history is provided by the patient.  Abdominal Pain Pain location:  Generalized Pain quality: aching   Pain severity:  Moderate Onset quality:  Gradual Duration:  1 day Timing:  Intermittent Progression:  Worsening Chronicity:  New Relieved by:  Nothing Worsened by:  Palpation and movement Associated symptoms: diarrhea, nausea and vomiting   Associated symptoms: no cough   Risk factors: no recent hospitalization   Risk factors comment:  No travel recently, no antibiotics Pt presents for nausea/vomiting/diarrhea She also reports abd pain and back pain No fever No blood in stool No sick contacts  Past Medical History  Diagnosis Date  . Hypertension   . Diabetes mellitus   . Fibromyalgia   . Depression   . Asthma   . Anxiety    Past Surgical History  Procedure Laterality Date  . Uterine fibroid surgery    . Cesarean section     No family history on file. Social History  Substance Use Topics  . Smoking status: Never Smoker   . Smokeless tobacco: None  . Alcohol Use: No   OB History    No data available     Review of Systems  Respiratory: Negative for cough.   Gastrointestinal: Positive for nausea, vomiting, abdominal pain and diarrhea.  All other systems reviewed and are negative.     Allergies  Oxycodone-aspirin  Home Medications   Prior to Admission medications   Medication Sig Start Date End Date Taking? Authorizing Provider  albuterol (PROVENTIL HFA;VENTOLIN HFA) 108 (90 BASE) MCG/ACT inhaler Inhale 2 puffs into the lungs every 6 (six) hours as needed. For shortness of breath and wheezing     Historical Provider, MD  ALPRAZolam Duanne Moron) 1 MG tablet Take 1 mg by mouth 3 (three) times daily as needed. For  anxiety      Historical Provider, MD  aspirin 325 MG EC tablet Take 325 mg by mouth daily.      Historical Provider, MD  BuPROPion HCl (WELLBUTRIN XL PO) Take 1 tablet by mouth daily.      Historical Provider, MD  esomeprazole (NEXIUM) 40 MG capsule Take 40 mg by mouth daily before breakfast.      Historical Provider, MD  FLUoxetine (PROZAC) 40 MG capsule Take 80 mg by mouth daily.      Historical Provider, MD  Fluticasone-Salmeterol (ADVAIR) 500-50 MCG/DOSE AEPB Inhale 1 puff into the lungs every 12 (twelve) hours.      Historical Provider, MD  glipiZIDE-metformin (METAGLIP) 5-500 MG per tablet Take 1 tablet by mouth daily.      Historical Provider, MD  insulin aspart (NOVOLOG) 100 UNIT/ML injection Inject into the skin 3 (three) times daily before meals. Per sliding scale    Historical Provider, MD  insulin glargine (LANTUS) 100 UNIT/ML injection Inject 20 Units into the skin at bedtime.      Historical Provider, MD  mometasone (NASONEX) 50 MCG/ACT nasal spray Place 2 sprays into both nostrils daily.      Historical Provider, MD  oxycodone (OXY-IR) 5 MG capsule Take 5 mg by mouth every 4 (four) hours as needed. For pain     Historical Provider, MD  zolpidem (AMBIEN) 10 MG tablet Take 10 mg  by mouth at bedtime.      Historical Provider, MD   BP 136/91 mmHg  Pulse 106  Temp(Src) 98.8 F (37.1 C) (Oral)  Resp 18  Ht 5\' 8"  (1.727 m)  Wt 289 lb (131.09 kg)  BMI 43.95 kg/m2  LMP 12/29/2014 Physical Exam CONSTITUTIONAL: Well developed/well nourished HEAD: Normocephalic/atraumatic EYES: EOMI/PERRL ENMT: Mucous membranes moist NECK: supple no meningeal signs SPINE/BACK:entire spine nontender CV: S1/S2 noted, no murmurs/rubs/gallops noted LUNGS: Lungs are clear to auscultation bilaterally, no apparent distress ABDOMEN: soft, mild diffuse tenderness, no rebound or guarding, bowel sounds noted throughout abdomen, she is obese GU:no cva tenderness NEURO: Pt is awake/alert/appropriate, moves  all extremitiesx4.  No facial droop.   EXTREMITIES: pulses normal/equal, full ROM SKIN: warm, color normal PSYCH: no abnormalities of mood noted, alert and oriented to situation  ED Course  Procedures   7:07 PM Pt with continued abd pain, she has diffuse moderate tenderness, will perform CT imaging 9:07 PM CT scan neg for acute disease, likely diarrheal illness Pt advised of fibroid on CT imaging and need for f/u Also discussed need to see PCP if diarrhea persists next week She is nontoxic in appearance Stable for d/c home  Labs Review Labs Reviewed  URINALYSIS, [place] (NOT AT [facility]) - Abnormal; Notable for the following:    Hgb urine dipstick TRACE (*)    Protein, ur 100 (*)    All other components within normal limits  CBC - Abnormal; Notable for the following:    MCV 76.2 (*)    MCH 24.6 (*)    RDW 15.7 (*)    All other components within normal limits  COMPREHENSIVE METABOLIC PANEL - Abnormal; Notable for the following:    Potassium 3.4 (*)    Glucose, Bld 188 (*)    Calcium 8.3 (*)    AST 56 (*)    Total Bilirubin 0.2 (*)    All other components within normal limits  PREGNANCY, URINE  URINE MICROSCOPIC-ADD ON    Imaging Review Ct Abdomen Pelvis W Contrast  01/08/2015   ADDENDUM REPORT: 01/08/2015 20:24 ADDENDUM: Please note in the overall appearance and size of the uterus is grossly similar to the study dated 04/21/2013 Electronically Signed   By: Anner Crete M.D.   On: 01/08/2015 20:24  01/08/2015   CLINICAL DATA:  54 year old female with abdominal pain and nausea vomiting  EXAM: CT ABDOMEN AND PELVIS WITH CONTRAST  TECHNIQUE: Multidetector CT imaging of the abdomen and pelvis was performed using the standard protocol following bolus administration of intravenous contrast.  CONTRAST:  151mL OMNIPAQUE IOHEXOL 300 MG/ML  SOLN  COMPARISON:  Lumbar MRI dated 11/1914 and CT dated 04/21/2013  FINDINGS: Evaluation is limited due to streak artifact  caused by patient's arms.  The visualized lung bases are clear. No intra-abdominal free air or free fluid.  Apparent diffuse hepatic steatosis. The gallbladder, pancreas, spleen, adrenal glands, kidneys, visualized ureters, and urinary bladder appear unremarkable.  The uterus is enlarged with multiple fibroids. The largest fibroid appears to measure up to 10 x 12 cm. A 4 cm rim calcified fibroid is noted.  Loose stool noted throughout the colon compatible with diarrheal state. Correlation with clinical exam and stool cultures recommended. There is no evidence of bowel obstruction or inflammation. Normal appendix.  The abdominal aorta and IVC appear unremarkable. No portal venous gas identified. There is no lymphadenopathy. There is a small fat containing umbilical hernia. A small fat containing supraumbilical hernia is also noted.  There is degenerative changes of the spine. No acute fracture.  IMPRESSION: Diarrheal state. Correlation with clinical exam and stool cultures recommended. No evidence of bowel obstruction or inflammation.  Enlarged and myomatous uterus.  MRI may provide better evaluation.  Electronically Signed: By: Anner Crete M.D. On: 01/08/2015 20:16   I have personally reviewed and evaluated these lab results as part of my medical decision-making.  Medications  sodium chloride 0.9 % bolus 1,000 mL (0 mLs Intravenous Stopped 01/08/15 1851)  ondansetron (ZOFRAN) injection 4 mg (4 mg Intravenous Given 01/08/15 1749)  pantoprazole (PROTONIX) injection 40 mg (40 mg Intravenous Given 01/08/15 1749)  morphine 4 MG/ML injection 4 mg (4 mg Intravenous Given 01/08/15 1851)  ondansetron (ZOFRAN) injection 4 mg (4 mg Intravenous Given 01/08/15 1851)     MDM   Final diagnoses:  Vomiting and diarrhea  Generalized abdominal pain  Uterine leiomyoma, unspecified location    Nursing notes including past medical history and social history reviewed and considered in documentation Labs/vital reviewed  myself and considered during evaluation     Ripley Fraise, MD 01/08/15 2107

## 2015-02-05 ENCOUNTER — Encounter (HOSPITAL_BASED_OUTPATIENT_CLINIC_OR_DEPARTMENT_OTHER): Payer: Self-pay | Admitting: Emergency Medicine

## 2015-02-05 ENCOUNTER — Emergency Department (HOSPITAL_BASED_OUTPATIENT_CLINIC_OR_DEPARTMENT_OTHER)
Admission: EM | Admit: 2015-02-05 | Discharge: 2015-02-05 | Disposition: A | Discharge status: Home / Self Care | Payer: Medicaid Other | Attending: Emergency Medicine | Admitting: Emergency Medicine

## 2015-02-05 DIAGNOSIS — I10 Essential (primary) hypertension: Secondary | ICD-10-CM | POA: Insufficient documentation

## 2015-02-05 DIAGNOSIS — M797 Fibromyalgia: Secondary | ICD-10-CM | POA: Insufficient documentation

## 2015-02-05 DIAGNOSIS — F329 Major depressive disorder, single episode, unspecified: Secondary | ICD-10-CM | POA: Diagnosis not present

## 2015-02-05 DIAGNOSIS — E1140 Type 2 diabetes mellitus with diabetic neuropathy, unspecified: Secondary | ICD-10-CM | POA: Insufficient documentation

## 2015-02-05 DIAGNOSIS — F419 Anxiety disorder, unspecified: Secondary | ICD-10-CM | POA: Insufficient documentation

## 2015-02-05 DIAGNOSIS — J45909 Unspecified asthma, uncomplicated: Secondary | ICD-10-CM | POA: Insufficient documentation

## 2015-02-05 DIAGNOSIS — I2510 Atherosclerotic heart disease of native coronary artery without angina pectoris: Secondary | ICD-10-CM | POA: Insufficient documentation

## 2015-02-05 DIAGNOSIS — Z79899 Other long term (current) drug therapy: Secondary | ICD-10-CM | POA: Insufficient documentation

## 2015-02-05 DIAGNOSIS — Z7951 Long term (current) use of inhaled steroids: Secondary | ICD-10-CM | POA: Diagnosis not present

## 2015-02-05 DIAGNOSIS — I252 Old myocardial infarction: Secondary | ICD-10-CM | POA: Insufficient documentation

## 2015-02-05 DIAGNOSIS — I471 Supraventricular tachycardia: Secondary | ICD-10-CM | POA: Diagnosis not present

## 2015-02-05 DIAGNOSIS — Z7982 Long term (current) use of aspirin: Secondary | ICD-10-CM | POA: Diagnosis not present

## 2015-02-05 DIAGNOSIS — R002 Palpitations: Secondary | ICD-10-CM | POA: Diagnosis present

## 2015-02-05 DIAGNOSIS — Z794 Long term (current) use of insulin: Secondary | ICD-10-CM | POA: Diagnosis not present

## 2015-02-05 HISTORY — DX: Acute myocardial infarction, unspecified: I21.9

## 2015-02-05 LAB — DIFFERENTIAL
Basophils Absolute: 0 10*3/uL (ref 0.0–0.1)
Basophils Relative: 0 %
EOS ABS: 0.1 10*3/uL (ref 0.0–0.7)
EOS PCT: 1 %
Lymphocytes Relative: 43 %
Lymphs Abs: 4.1 10*3/uL — ABNORMAL HIGH (ref 0.7–4.0)
Monocytes Absolute: 0.9 10*3/uL (ref 0.1–1.0)
Monocytes Relative: 9 %
Neutro Abs: 4.6 10*3/uL (ref 1.7–7.7)
Neutrophils Relative %: 47 %

## 2015-02-05 LAB — CBC
HCT: 35.4 % — ABNORMAL LOW (ref 36.0–46.0)
HEMOGLOBIN: 11.5 g/dL — AB (ref 12.0–15.0)
MCH: 24.9 pg — ABNORMAL LOW (ref 26.0–34.0)
MCHC: 32.5 g/dL (ref 30.0–36.0)
MCV: 76.8 fL — ABNORMAL LOW (ref 78.0–100.0)
Platelets: 342 10*3/uL (ref 150–400)
RBC: 4.61 MIL/uL (ref 3.87–5.11)
RDW: 15.3 % (ref 11.5–15.5)
WBC: 9.7 10*3/uL (ref 4.0–10.5)

## 2015-02-05 LAB — TROPONIN I: TROPONIN I: 0.03 ng/mL (ref <0.031)

## 2015-02-05 MED ORDER — ADENOSINE 6 MG/2ML IV SOLN
INTRAVENOUS | Status: AC
  Filled 2015-02-05: qty 4

## 2015-02-05 MED ORDER — ADENOSINE 6 MG/2ML IV SOLN
6.0000 mg | Freq: Once | INTRAVENOUS | Status: AC
  Administered 2015-02-05: 6 mg via INTRAVENOUS

## 2015-02-05 MED ORDER — DIPHENHYDRAMINE HCL 50 MG/ML IJ SOLN
25.0000 mg | Freq: Once | INTRAMUSCULAR | Status: AC
  Administered 2015-02-05: 25 mg via INTRAVENOUS
  Filled 2015-02-05: qty 1

## 2015-02-05 NOTE — ED Notes (Signed)
Patient states that she is having chest pain starting about 2 hours ago. Patient also reports SOB

## 2015-02-05 NOTE — ED Provider Notes (Signed)
CSN: [id number]     Arrival date & time 02/05/15  0009 History   First MD Initiated Contact with Patient 02/05/15 0015     Chief Complaint  Patient presents with  . Tachycardia     (Consider location/radiation/quality/duration/timing/severity/associated sxs/prior Treatment) HPI  This is a 54 year old female with a history of diabetes and coronary artery disease. She is here with palpitations which began about 45 minutes earlier. She was noted to have a heart rate as high as 201 on arrival. There is associated chest discomfort which she describes as a pressure along with the feeling that she cannot get her breath and is feeling anxious. She was sweating earlier but not presently. She denies nausea.   She has had 2 days of itching all over since being bitten by something; there is no associated rash. 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  Past Medical History  Diagnosis Date  . Hypertension   . Diabetes mellitus   . Fibromyalgia   . Depression   . Asthma   . Anxiety   . Heart attack [facility])    Past Surgical History  Procedure Laterality Date  . Uterine fibroid surgery    . Cesarean section     History reviewed. No pertinent family history. Social History  Substance Use Topics  . Smoking status: Never Smoker   . Smokeless tobacco: None  . Alcohol Use: No   OB History    No data available     Review of Systems  All other systems reviewed and are negative.     Allergies  Oxycodone-aspirin  Home Medications   Prior to Admission medications   Medication Sig Start Date End Date Taking? Authorizing Provider  albuterol (PROVENTIL HFA;VENTOLIN HFA) 108 (90 BASE) MCG/ACT inhaler Inhale 2 puffs into the lungs every 6 (six) hours as needed. For shortness of breath and wheezing     Historical Provider, MD  amLODipine-valsartan (EXFORGE) 5-160 MG per tablet Take 1 tablet by mouth daily.    Historical Provider, MD  aspirin 325 MG EC tablet Take 325 mg by  mouth daily.      Historical Provider, MD  beclomethasone (QVAR) 80 MCG/ACT inhaler Inhale 2 puffs into the lungs 2 (two) times daily.    Historical Provider, MD  BuPROPion HCl (WELLBUTRIN XL PO) Take 1 tablet by mouth daily.      Historical Provider, MD  clonazePAM (KLONOPIN) 0.5 MG tablet Take 0.5 mg by mouth 2 (two) times daily as needed for anxiety.    Historical Provider, MD  FLUoxetine (PROZAC) 40 MG capsule Take 80 mg by mouth daily.      Historical Provider, MD  Fluticasone-Salmeterol (ADVAIR) 500-50 MCG/DOSE AEPB Inhale 1 puff into the lungs every 12 (twelve) hours.      Historical Provider, MD  insulin aspart (NOVOLOG) 100 UNIT/ML injection Inject into the skin 3 (three) times daily before meals. Per sliding scale    Historical Provider, MD  insulin glargine (LANTUS) 100 UNIT/ML injection Inject 20 Units into the skin at bedtime.      Historical Provider, MD  mometasone (NASONEX) 50 MCG/ACT nasal spray Place 2 sprays into both nostrils daily.      Historical Provider, MD  omeprazole (PRILOSEC) 40 MG capsule Take 40 mg by mouth daily.    Historical Provider, MD  oxycodone (OXY-IR) 5 MG capsule Take 5 mg by mouth every 4 (four) hours as needed. For pain     Historical Provider, MD   BP 133/83  mmHg  Pulse 115  Temp(Src) 97.8 F (36.6 C) (Oral)  Resp 20  Ht 5\' 6"  (1.676 m)  Wt 293 lb (132.904 kg)  BMI 47.31 kg/m2  SpO2 100%  LMP 11/24/2014   Physical Exam  General: Well-developed, obese female in no acute distress; appearance consistent with age of record HENT: normocephalic; atraumatic Eyes: pupils equal, round and reactive to light; extraocular muscles intact Neck: supple Heart: regular rate and rhythm; tachycardia, SVT on monitor; no ectopy Lungs: clear to auscultation bilaterally Abdomen: soft; nondistended; nontender; bowel sounds present Extremities: No deformity; full range of motion; pulses normal Neurologic: Awake, alert and oriented; motor function intact in all  extremities and symmetric; no facial droop Skin: Warm and dry; no rash seen Psychiatric: Anxious     ED Course  Procedures (including critical care time)  CARDIOVERSION The patient was placed on the monitor and supplemental oxygen provided. She was given 6 milligrams of adenosine by IV push. She converted to sinus tachycardia. The patient tolerated this well and there were no immediate complications. EKG interpretations are noted below.   MDM  Nursing notes and vitals signs, including pulse oximetry, reviewed.  Summary of this visit's results, reviewed by myself:   EKG Interpretation  Date/Time:  Thursday February 05 2015 00:26:59 EDT Ventricular Rate:  125 PR Interval:  146 QRS Duration: 88 QT Interval:  306 QTC Calculation: 441 R Axis:   -21 Text Interpretation:  Sinus tachycardia Otherwise normal ECG Previously SVT Confirmed by Florina Ou  MD, Jenny Reichmann ([id number]) on 02/05/2015 12:33:21 AM       EKG Interpretation  Date/Time:  Thursday February 05 2015 00:26:59 EDT Ventricular Rate:  125 PR Interval:  146 QRS Duration: 88 QT Interval:  306 QTC Calculation: 441 R Axis:   -21 Text Interpretation:  Sinus tachycardia Otherwise normal ECG Previously SVT Confirmed by Jaiceon Collister  MD, Jenny Reichmann ([id number]) on 02/05/2015 12:33:21 AM      Labs:  Results for orders placed or performed during the hospital encounter of 02/05/15 (from the past 24 hour(s))  CBC     Status: Abnormal   Collection Time: 02/05/15 12:30 AM  Result Value Ref Range   WBC 9.7 4.0 - 10.5 K/uL   RBC 4.61 3.87 - 5.11 MIL/uL   Hemoglobin 11.5 (L) 12.0 - 15.0 g/dL   HCT 35.4 (L) 36.0 - 46.0 %   MCV 76.8 (L) 78.0 - 100.0 fL   MCH 24.9 (L) 26.0 - 34.0 pg   MCHC 32.5 30.0 - 36.0 g/dL   RDW 15.3 11.5 - 15.5 %   Platelets 342 150 - 400 K/uL  Troponin I     Status: None   Collection Time: 02/05/15 12:30 AM  Result Value Ref Range   Troponin I <0.03 <0.031 ng/mL  Differential     Status: Abnormal   Collection Time: 02/05/15  12:30 AM  Result Value Ref Range   Neutrophils Relative % 47 %   Neutro Abs 4.6 1.7 - 7.7 K/uL   Lymphocytes Relative 43 %   Lymphs Abs 4.1 (H) 0.7 - 4.0 K/uL   Monocytes Relative 9 %   Monocytes Absolute 0.9 0.1 - 1.0 K/uL   Eosinophils Relative 1 %   Eosinophils Absolute 0.1 0.0 - 0.7 K/uL   Basophils Relative 0 %   Basophils Absolute 0.0 0.0 - 0.1 K/uL  Troponin I     Status: None   Collection Time: 02/05/15  3:30 AM  Result Value Ref Range   Troponin I 0.03 <0.031 ng/mL  4:06 AM Patient asymptomatic. Troponin negative 2. Rhythm continues to be sinus tachycardia, rate about 103 presently. We'll have her follow-up with cardiology.    Shanon Rosser, MD 02/05/15 [phone]

## 2015-02-05 NOTE — ED Notes (Signed)
Md at the bedside from [place] - to present .

## 2015-02-09 ENCOUNTER — Emergency Department (HOSPITAL_BASED_OUTPATIENT_CLINIC_OR_DEPARTMENT_OTHER)
Admission: EM | Admit: 2015-02-09 | Discharge: 2015-02-09 | Disposition: A | Discharge status: Home / Self Care | Payer: Medicaid Other | Attending: Emergency Medicine | Admitting: Emergency Medicine

## 2015-02-09 ENCOUNTER — Encounter (HOSPITAL_BASED_OUTPATIENT_CLINIC_OR_DEPARTMENT_OTHER): Payer: Self-pay | Admitting: Emergency Medicine

## 2015-02-09 DIAGNOSIS — F329 Major depressive disorder, single episode, unspecified: Secondary | ICD-10-CM | POA: Diagnosis not present

## 2015-02-09 DIAGNOSIS — F419 Anxiety disorder, unspecified: Secondary | ICD-10-CM | POA: Insufficient documentation

## 2015-02-09 DIAGNOSIS — Z79899 Other long term (current) drug therapy: Secondary | ICD-10-CM | POA: Insufficient documentation

## 2015-02-09 DIAGNOSIS — J45909 Unspecified asthma, uncomplicated: Secondary | ICD-10-CM | POA: Insufficient documentation

## 2015-02-09 DIAGNOSIS — Z7982 Long term (current) use of aspirin: Secondary | ICD-10-CM | POA: Insufficient documentation

## 2015-02-09 DIAGNOSIS — Z8674 Personal history of sudden cardiac arrest: Secondary | ICD-10-CM | POA: Diagnosis not present

## 2015-02-09 DIAGNOSIS — E876 Hypokalemia: Secondary | ICD-10-CM | POA: Insufficient documentation

## 2015-02-09 DIAGNOSIS — Z7951 Long term (current) use of inhaled steroids: Secondary | ICD-10-CM | POA: Diagnosis not present

## 2015-02-09 DIAGNOSIS — I471 Supraventricular tachycardia: Secondary | ICD-10-CM | POA: Insufficient documentation

## 2015-02-09 DIAGNOSIS — E119 Type 2 diabetes mellitus without complications: Secondary | ICD-10-CM | POA: Diagnosis not present

## 2015-02-09 DIAGNOSIS — I10 Essential (primary) hypertension: Secondary | ICD-10-CM | POA: Diagnosis not present

## 2015-02-09 DIAGNOSIS — Z8739 Personal history of other diseases of the musculoskeletal system and connective tissue: Secondary | ICD-10-CM | POA: Diagnosis not present

## 2015-02-09 DIAGNOSIS — Z794 Long term (current) use of insulin: Secondary | ICD-10-CM | POA: Insufficient documentation

## 2015-02-09 DIAGNOSIS — R000 Tachycardia, unspecified: Secondary | ICD-10-CM | POA: Diagnosis present

## 2015-02-09 LAB — CBC WITH DIFFERENTIAL/PLATELET
BASOS ABS: 0 10*3/uL (ref 0.0–0.1)
Basophils Relative: 0 %
EOS ABS: 0.1 10*3/uL (ref 0.0–0.7)
EOS PCT: 2 %
HCT: 36.4 % (ref 36.0–46.0)
Hemoglobin: 12.1 g/dL (ref 12.0–15.0)
LYMPHS ABS: 4.3 10*3/uL — AB (ref 0.7–4.0)
Lymphocytes Relative: 45 %
MCH: 25.1 pg — AB (ref 26.0–34.0)
MCHC: 33.2 g/dL (ref 30.0–36.0)
MCV: 75.4 fL — ABNORMAL LOW (ref 78.0–100.0)
MONO ABS: 1 10*3/uL (ref 0.1–1.0)
Monocytes Relative: 10 %
Neutro Abs: 4.1 10*3/uL (ref 1.7–7.7)
Neutrophils Relative %: 43 %
PLATELETS: 292 10*3/uL (ref 150–400)
RBC: 4.83 MIL/uL (ref 3.87–5.11)
RDW: 15.1 % (ref 11.5–15.5)
WBC: 9.5 10*3/uL (ref 4.0–10.5)

## 2015-02-09 LAB — BASIC METABOLIC PANEL
Anion gap: 8 (ref 5–15)
BUN: 17 mg/dL (ref 6–20)
CALCIUM: 8.6 mg/dL — AB (ref 8.9–10.3)
CO2: 22 mmol/L (ref 22–32)
Chloride: 106 mmol/L (ref 101–111)
Creatinine, Ser: 0.71 mg/dL (ref 0.44–1.00)
GFR calc Af Amer: >60 mL/min (ref >60)
Glucose, Bld: 352 mg/dL — ABNORMAL HIGH (ref 65–99)
POTASSIUM: 3.1 mmol/L — AB (ref 3.5–5.1)
SODIUM: 136 mmol/L (ref 135–145)

## 2015-02-09 MED ORDER — ADENOSINE 6 MG/2ML IV SOLN
INTRAVENOUS | Status: AC
  Administered 2015-02-09: 6 mg
  Filled 2015-02-09: qty 6

## 2015-02-09 MED ORDER — POTASSIUM CHLORIDE CRYS ER 20 MEQ PO TBCR
40.0000 meq | EXTENDED_RELEASE_TABLET | Freq: Once | ORAL | Status: AC
  Administered 2015-02-09: 40 meq via ORAL
  Filled 2015-02-09: qty 2

## 2015-02-09 MED ORDER — METOPROLOL TARTRATE 50 MG PO TABS
25.0000 mg | ORAL_TABLET | Freq: Once | ORAL | Status: AC
  Administered 2015-02-09: 25 mg via ORAL
  Filled 2015-02-09: qty 1

## 2015-02-09 MED ORDER — METOPROLOL TARTRATE 25 MG PO TABS: 100.0000 mg | ORAL_TABLET | Freq: Two times a day (BID) | ORAL | Status: DC

## 2015-02-09 NOTE — ED Notes (Signed)
MD at bedside. 

## 2015-02-09 NOTE — ED Provider Notes (Addendum)
CSN: [id number]     Arrival date & time 02/09/15  3295 History   First MD Initiated Contact with Patient 02/09/15 0815     Chief Complaint  Patient presents with  . Tachycardia     (Consider location/radiation/quality/duration/timing/severity/associated sxs/prior Treatment) Patient is a 54 y.o. female presenting with palpitations. The history is provided by the patient.  Palpitations Palpitations quality:  Fast Onset quality:  Sudden Duration:  1 hour Timing:  Constant Progression:  Unchanged Chronicity:  Recurrent Context: not caffeine, not dehydration, not illicit drugs and not stimulant use   Context comment:  Woke up this morning and then developed palpitations Relieved by:  None tried Worsened by:  Nothing Ineffective treatments:  None tried Associated symptoms: dizziness and shortness of breath   Associated symptoms: no chest pain, no lower extremity edema, no nausea, no syncope, no vomiting and no weakness   Associated symptoms comment:  1-2 episodes of diarrhea for the last 3 days.  No cough, fever or other complaints.  No recent med changes Risk factors: diabetes mellitus and heart disease   Risk factors: no hx of PE and no hypercoagulable state   Risk factors comment:  Episode of SVT 4 days ago converted with adenosine   Past Medical History  Diagnosis Date  . Hypertension   . Diabetes mellitus   . Fibromyalgia   . Depression   . Asthma   . Anxiety   . Heart attack [facility])    Past Surgical History  Procedure Laterality Date  . Uterine fibroid surgery    . Cesarean section     No family history on file. Social History  Substance Use Topics  . Smoking status: Never Smoker   . Smokeless tobacco: None  . Alcohol Use: No   OB History    No data available     Review of Systems  Respiratory: Positive for shortness of breath.   Cardiovascular: Positive for palpitations. Negative for chest pain and syncope.  Gastrointestinal: Negative for nausea and  vomiting.  Neurological: Positive for dizziness. Negative for weakness.  All other systems reviewed and are negative.     Allergies  Oxycodone-aspirin  Home Medications   Prior to Admission medications   Medication Sig Start Date End Date Taking? Authorizing Provider  albuterol (PROVENTIL HFA;VENTOLIN HFA) 108 (90 BASE) MCG/ACT inhaler Inhale 2 puffs into the lungs every 6 (six) hours as needed. For shortness of breath and wheezing     Historical Provider, MD  amLODipine-valsartan (EXFORGE) 5-160 MG per tablet Take 1 tablet by mouth daily.    Historical Provider, MD  aspirin 325 MG EC tablet Take 325 mg by mouth daily.      Historical Provider, MD  beclomethasone (QVAR) 80 MCG/ACT inhaler Inhale 2 puffs into the lungs 2 (two) times daily.    Historical Provider, MD  BuPROPion HCl (WELLBUTRIN XL PO) Take 1 tablet by mouth daily.      Historical Provider, MD  clonazePAM (KLONOPIN) 0.5 MG tablet Take 0.5 mg by mouth 2 (two) times daily as needed for anxiety.    Historical Provider, MD  FLUoxetine (PROZAC) 40 MG capsule Take 80 mg by mouth daily.      Historical Provider, MD  Fluticasone-Salmeterol (ADVAIR) 500-50 MCG/DOSE AEPB Inhale 1 puff into the lungs every 12 (twelve) hours.      Historical Provider, MD  insulin aspart (NOVOLOG) 100 UNIT/ML injection Inject into the skin 3 (three) times daily before meals. Per sliding scale    Historical Provider, MD  insulin glargine (LANTUS) 100 UNIT/ML injection Inject 20 Units into the skin at bedtime.      Historical Provider, MD  mometasone (NASONEX) 50 MCG/ACT nasal spray Place 2 sprays into both nostrils daily.      Historical Provider, MD  omeprazole (PRILOSEC) 40 MG capsule Take 40 mg by mouth daily.    Historical Provider, MD  oxycodone (OXY-IR) 5 MG capsule Take 5 mg by mouth every 4 (four) hours as needed. For pain     Historical Provider, MD   BP 181/107 mmHg  Pulse 106  Temp(Src) 97.9 F (36.6 C) (Oral)  Resp 15  Ht 5\' 8"  (1.727 m)   Wt 293 lb (132.904 kg)  BMI 44.56 kg/m2  SpO2 100% Physical Exam  Constitutional: She is oriented to person, place, and time. She appears well-developed and well-nourished.  Looks uncomfortable  HENT:  Head: Normocephalic and atraumatic.  Mouth/Throat: Oropharynx is clear and moist.  Eyes: Conjunctivae and EOM are normal. Pupils are equal, round, and reactive to light.  Neck: Normal range of motion. Neck supple.  Cardiovascular: Regular rhythm and intact distal pulses.  Tachycardia present.   No murmur heard. Pulmonary/Chest: Effort normal and breath sounds normal. No respiratory distress. She has no wheezes. She has no rales.  Abdominal: Soft. She exhibits no distension. There is no tenderness. There is no rebound and no guarding.  Musculoskeletal: Normal range of motion. She exhibits no edema or tenderness.  Neurological: She is alert and oriented to person, place, and time.  Skin: Skin is warm and dry. No rash noted. No erythema.  Psychiatric: She has a normal mood and affect. Her behavior is normal.  Nursing note and vitals reviewed.   ED Course  Procedures (including critical care time) Labs Review Labs Reviewed  CBC WITH DIFFERENTIAL/PLATELET - Abnormal; Notable for the following:    MCV 75.4 (*)    MCH 25.1 (*)    Lymphs Abs 4.3 (*)    All other components within normal limits  BASIC METABOLIC PANEL - Abnormal; Notable for the following:    Potassium 3.1 (*)    Glucose, Bld 352 (*)    Calcium 8.6 (*)    All other components within normal limits    Imaging Review No results found. I have personally reviewed and evaluated these images and lab results as part of my medical decision-making.   EKG Interpretation   Date/Time:  Monday February 09 2015 08:18:09 EDT Ventricular Rate:  107 PR Interval:  184 QRS Duration: 88 QT Interval:  330 QTC Calculation: 440 R Axis:   -38 Text Interpretation:  Sinus tachycardia Left axis deviation Cannot rule  out Anterior infarct  , age undetermined svt resolved from prior EKG  Confirmed by [facility]  MD, Loree Fee ([id number]) on 02/09/2015 9:13:11 AM      MDM   Final diagnoses:  Hypokalemia  SVT (supraventricular tachycardia) [facility])    Patient is a 54 year old female with a history of diabetes and hypertension who presents today with SVT. She started feeling the sensation of palpitations approximately one hour prior to arrival. EKG confirmed SVT. She compared planes of 2-3 days of diarrhea no more than 2 episodes per day but denies any other symptoms until the palpitations, shortness of breath and lightheadedness started this morning. Patient was seen 4 days ago for the same. At that time she was diagnosed with SVT and converted with adenosine. She had spoken with her doctor and has an appointment to see her PCP on October 28 but  has not gotten in to see a cardiologist yet.  Blood pressure is stable with heart rates initially in the 180s. After 6 mg of adenosine patient converted to a normal sinus rhythm in the low 100s.  Given the complaint of several episodes of diarrhea check electrolytes to ensure there are no issues. Both talk with [facility] for a follow-up appointment and to see if patient needs to be on a calcium channel blocker or beta blocker to prevent further episodes.  9:53 AM Patient's potassium is slightly low at 3.1 and it was replaced with oral potassium. Spoke with cardiologist with [facility] and they recommended starting metoprolol 25 mg as well as a follow-up appointment by calling their office. All this information was relayed to the patient. She was given instructions to avoid any caffeinated products or stimulants.  Blanchie Dessert, MD 02/09/15 [place], MD 02/09/15 [phone]

## 2015-02-09 NOTE — ED Notes (Signed)
MD at bedside. For discussion about medications and follow up plan with cardiologist.

## 2015-02-09 NOTE — ED Notes (Signed)
Pt reports sharp pain in chest when she woke up also with SOB. Hx of SVT 10/13.

## 2015-02-09 NOTE — ED Notes (Signed)
Ambulated to the bathroom x1 assist.  

## 2015-09-02 ENCOUNTER — Encounter (HOSPITAL_BASED_OUTPATIENT_CLINIC_OR_DEPARTMENT_OTHER): Payer: Self-pay | Admitting: *Deleted

## 2015-09-02 ENCOUNTER — Emergency Department (HOSPITAL_BASED_OUTPATIENT_CLINIC_OR_DEPARTMENT_OTHER): Payer: Medicaid Other

## 2015-09-02 ENCOUNTER — Emergency Department (HOSPITAL_BASED_OUTPATIENT_CLINIC_OR_DEPARTMENT_OTHER)
Admission: EM | Admit: 2015-09-02 | Discharge: 2015-09-02 | Disposition: A | Discharge status: Home / Self Care | Payer: Medicaid Other | Attending: Emergency Medicine | Admitting: Emergency Medicine

## 2015-09-02 DIAGNOSIS — F329 Major depressive disorder, single episode, unspecified: Secondary | ICD-10-CM | POA: Diagnosis not present

## 2015-09-02 DIAGNOSIS — I10 Essential (primary) hypertension: Secondary | ICD-10-CM | POA: Insufficient documentation

## 2015-09-02 DIAGNOSIS — E1165 Type 2 diabetes mellitus with hyperglycemia: Secondary | ICD-10-CM | POA: Insufficient documentation

## 2015-09-02 DIAGNOSIS — J45909 Unspecified asthma, uncomplicated: Secondary | ICD-10-CM | POA: Insufficient documentation

## 2015-09-02 DIAGNOSIS — R109 Unspecified abdominal pain: Secondary | ICD-10-CM | POA: Insufficient documentation

## 2015-09-02 DIAGNOSIS — R079 Chest pain, unspecified: Secondary | ICD-10-CM

## 2015-09-02 DIAGNOSIS — R0602 Shortness of breath: Secondary | ICD-10-CM | POA: Diagnosis not present

## 2015-09-02 DIAGNOSIS — Z79899 Other long term (current) drug therapy: Secondary | ICD-10-CM | POA: Diagnosis not present

## 2015-09-02 DIAGNOSIS — R072 Precordial pain: Secondary | ICD-10-CM | POA: Diagnosis present

## 2015-09-02 DIAGNOSIS — Z794 Long term (current) use of insulin: Secondary | ICD-10-CM | POA: Diagnosis not present

## 2015-09-02 DIAGNOSIS — R739 Hyperglycemia, unspecified: Secondary | ICD-10-CM

## 2015-09-02 DIAGNOSIS — R110 Nausea: Secondary | ICD-10-CM | POA: Insufficient documentation

## 2015-09-02 DIAGNOSIS — Z7982 Long term (current) use of aspirin: Secondary | ICD-10-CM | POA: Insufficient documentation

## 2015-09-02 HISTORY — DX: Polyneuropathy, unspecified: G62.9

## 2015-09-02 LAB — CBC
HCT: 33.8 % — ABNORMAL LOW (ref 36.0–46.0)
Hemoglobin: 11.5 g/dL — ABNORMAL LOW (ref 12.0–15.0)
MCH: 25.3 pg — ABNORMAL LOW (ref 26.0–34.0)
MCHC: 34 g/dL (ref 30.0–36.0)
MCV: 74.4 fL — AB (ref 78.0–100.0)
PLATELETS: 285 10*3/uL (ref 150–400)
RBC: 4.54 MIL/uL (ref 3.87–5.11)
RDW: 14.5 % (ref 11.5–15.5)
WBC: 6.3 10*3/uL (ref 4.0–10.5)

## 2015-09-02 LAB — TROPONIN I

## 2015-09-02 LAB — COMPREHENSIVE METABOLIC PANEL
ALT: 17 U/L (ref 14–54)
AST: 25 U/L (ref 15–41)
Albumin: 3.4 g/dL — ABNORMAL LOW (ref 3.5–5.0)
Alkaline Phosphatase: 74 U/L (ref 38–126)
Anion gap: 7 (ref 5–15)
BUN: 15 mg/dL (ref 6–20)
CHLORIDE: 101 mmol/L (ref 101–111)
CO2: 23 mmol/L (ref 22–32)
Calcium: 9.2 mg/dL (ref 8.9–10.3)
Creatinine, Ser: 0.87 mg/dL (ref 0.44–1.00)
GFR calc Af Amer: >60 mL/min (ref >60)
Glucose, Bld: 453 mg/dL — ABNORMAL HIGH (ref 65–99)
POTASSIUM: 3.6 mmol/L (ref 3.5–5.1)
Sodium: 131 mmol/L — ABNORMAL LOW (ref 135–145)
Total Bilirubin: 0.5 mg/dL (ref 0.3–1.2)
Total Protein: 7.7 g/dL (ref 6.5–8.1)

## 2015-09-02 LAB — PROTIME-INR
INR: 1.18 (ref 0.00–1.49)
PROTHROMBIN TIME: 15.2 s (ref 11.6–15.2)

## 2015-09-02 LAB — CBG MONITORING, ED
GLUCOSE-CAPILLARY: 365 mg/dL — AB (ref 65–99)
Glucose-Capillary: 441 mg/dL — ABNORMAL HIGH (ref 65–99)

## 2015-09-02 MED ORDER — ACETAMINOPHEN 500 MG PO TABS
ORAL_TABLET | ORAL | Status: AC
  Filled 2015-09-02: qty 2

## 2015-09-02 MED ORDER — NITROGLYCERIN 0.4 MG SL SUBL
0.4000 mg | SUBLINGUAL_TABLET | SUBLINGUAL | Status: DC | PRN
  Administered 2015-09-02: 0.4 mg via SUBLINGUAL

## 2015-09-02 MED ORDER — ASPIRIN 81 MG PO CHEW: 324.0000 mg | CHEWABLE_TABLET | Freq: Once | ORAL | Status: DC

## 2015-09-02 MED ORDER — INSULIN ASPART 100 UNIT/ML ~~LOC~~ SOLN: 10.0000 [IU] | Freq: Four times a day (QID) | SUBCUTANEOUS | Status: AC | PRN

## 2015-09-02 MED ORDER — ASPIRIN 81 MG PO CHEW
324.0000 mg | CHEWABLE_TABLET | Freq: Once | ORAL | Status: AC
  Administered 2015-09-02: 324 mg via ORAL

## 2015-09-02 MED ORDER — INSULIN GLARGINE 300 UNIT/ML ~~LOC~~ SOPN
90.0000 [IU] | PEN_INJECTOR | Freq: Two times a day (BID) | SUBCUTANEOUS | Status: DC
Start: 2015-09-02 — End: 2016-01-29

## 2015-09-02 MED ORDER — INSULIN REGULAR HUMAN 100 UNIT/ML IJ SOLN
10.0000 [IU] | Freq: Once | INTRAMUSCULAR | Status: AC
  Administered 2015-09-02: 10 [IU] via INTRAVENOUS
  Filled 2015-09-02: qty 1

## 2015-09-02 MED ORDER — ACETAMINOPHEN 500 MG PO TABS
1000.0000 mg | ORAL_TABLET | Freq: Once | ORAL | Status: AC
  Administered 2015-09-02: 1000 mg via ORAL

## 2015-09-02 MED ORDER — TRAMADOL HCL 50 MG PO TABS: 50.0000 mg | ORAL_TABLET | Freq: Four times a day (QID) | ORAL | Status: DC | PRN

## 2015-09-02 MED ORDER — ASPIRIN 81 MG PO CHEW
CHEWABLE_TABLET | ORAL | Status: AC
  Filled 2015-09-02: qty 4

## 2015-09-02 MED ORDER — NITROGLYCERIN 0.4 MG SL SUBL
SUBLINGUAL_TABLET | SUBLINGUAL | Status: AC
  Administered 2015-09-02: 0.4 mg via SUBLINGUAL
  Filled 2015-09-02: qty 3

## 2015-09-02 MED ORDER — SODIUM CHLORIDE 0.9 % IV BOLUS (SEPSIS)
500.0000 mL | Freq: Once | INTRAVENOUS | Status: AC
  Administered 2015-09-02: 500 mL via INTRAVENOUS

## 2015-09-02 MED ORDER — SODIUM CHLORIDE 0.9 % IV SOLN
INTRAVENOUS | Status: DC
  Administered 2015-09-02: 09:00:00 via INTRAVENOUS

## 2015-09-02 MED FILL — NovoLOG 100 UNIT/ML SOLN: 100 | 25 days supply | Qty: 10 | Fill #0

## 2015-09-02 MED FILL — ULTICARE PEN NDL 8MM 31G: 31G X 8 MM | 34 days supply | Qty: 100 | Fill #0

## 2015-09-02 MED FILL — TRUEPLUS SYR 0.5ML 30GX5/16: 30G X 5/16" | 25 days supply | Qty: 100 | Fill #0

## 2015-09-02 MED FILL — TOUJEO SOLOSTAR 300 UNITS/M: 300 | 8 days supply | Qty: 5 | Fill #0

## 2015-09-02 MED FILL — traMADol HCL 50 MG TABS: 50 | 3 days supply | Qty: 15 | Fill #0

## 2015-09-02 NOTE — ED Notes (Signed)
Pt reports chest pressure to left and mid chest x last Wednesday. Pt states this feels similar to her heart pain in the past, but she hoped it would pass on it's own. This pressure is off and on, associated with sob and nausea. Pt states the pressure has been relieved this week with bayer aspirins. Pt states she feels sob along with the pressure at this time, rates at 9/10.

## 2015-09-02 NOTE — ED Notes (Signed)
Pt states her primary care doctor left a month ago and she has not had any meds since then. States her blood sugars have been over 400 "for a while".

## 2015-09-02 NOTE — Discharge Instructions (Signed)
Workup for your chest pain without evidence of any acute cardiac event. Restart your Toujeo insulin pen. Also renewed your NovoLog for sliding scale adjustments. Work on finding a primary care doctor. Return for any new or worse symptoms.

## 2015-09-02 NOTE — ED Provider Notes (Signed)
CSN: [id number]     Arrival date & time 09/02/15  [id number] History   First MD Initiated Contact with Patient 09/02/15 [phone]     Chief Complaint  Patient presents with  . Chest Pain     (Consider location/radiation/quality/duration/timing/severity/associated sxs/prior Treatment) Patient is a 55 y.o. female presenting with chest pain. The history is provided by the patient.  Chest Pain Associated symptoms: abdominal pain and shortness of breath   Associated symptoms: no back pain, no fever and no headache   Patient with complaint of substernal and left-sided chest pain since Wednesday a week ago. Patient states pain is been constant has never gone away. Patient states is 9 out of 10. Patient also been out of her insulin for over 2 weeks. She lost her primary care provider. Patient states her blood sugars were running in the 400s at home. The chest pain is associated with some nausea and shortness of breath. Pain is nonradiating not made worse or better by anything.  Past Medical History  Diagnosis Date  . Hypertension   . Diabetes mellitus   . Fibromyalgia   . Depression   . Asthma   . Anxiety   . Heart attack ([place])   . Neuropathy [facility])    Past Surgical History  Procedure Laterality Date  . Uterine fibroid surgery    . Cesarean section     History reviewed. No pertinent family history. Social History  Substance Use Topics  . Smoking status: Never Smoker   . Smokeless tobacco: None  . Alcohol Use: No   OB History    No data available     Review of Systems  Constitutional: Negative for fever.  HENT: Negative for congestion.   Eyes: Negative for visual disturbance.  Respiratory: Positive for shortness of breath.   Cardiovascular: Positive for chest pain.  Gastrointestinal: Positive for abdominal pain.  Genitourinary: Negative for dysuria.  Musculoskeletal: Negative for back pain.  Skin: Negative for rash.  Neurological: Negative for headaches.  Hematological: Does not  bruise/bleed easily.  Psychiatric/Behavioral: Negative for confusion.      Allergies  Oxycodone-aspirin  Home Medications   Prior to Admission medications   Medication Sig Start Date End Date Taking? Authorizing Provider  albuterol (PROVENTIL HFA;VENTOLIN HFA) 108 (90 BASE) MCG/ACT inhaler Inhale 2 puffs into the lungs every 6 (six) hours as needed. For shortness of breath and wheezing    Yes Historical Provider, MD  aspirin 325 MG EC tablet Take 325 mg by mouth daily.     Yes Historical Provider, MD  beclomethasone (QVAR) 80 MCG/ACT inhaler Inhale 2 puffs into the lungs 2 (two) times daily.   Yes Historical Provider, MD  buPROPion (WELLBUTRIN) 75 MG tablet Take 75 mg by mouth daily.   Yes Historical Provider, MD  clonazePAM (KLONOPIN) 0.5 MG tablet Take 0.5 mg by mouth 3 (three) times daily as needed for anxiety.   Yes Historical Provider, MD  FLUoxetine (PROZAC) 40 MG capsule Take 40 mg by mouth daily.   Yes Historical Provider, MD  Fluticasone-Salmeterol (ADVAIR) 500-50 MCG/DOSE AEPB Inhale 1 puff into the lungs every 12 (twelve) hours.     Yes Historical Provider, MD  mometasone (NASONEX) 50 MCG/ACT nasal spray Place 2 sprays into both nostrils daily.     Yes Historical Provider, MD  oxycodone (OXY-IR) 5 MG capsule Take 5 mg by mouth every 4 (four) hours as needed. For pain    Yes Historical Provider, MD  insulin aspart (NOVOLOG) 100 UNIT/ML injection Inject 10  Units into the skin 4 (four) times daily as needed for high blood sugar (sliding scale). 09/02/15   Fredia Sorrow, MD  Insulin Glargine (TOUJEO SOLOSTAR) 300 UNIT/ML SOPN Inject 90 Units into the skin 2 (two) times daily. 09/02/15   Fredia Sorrow, MD  omeprazole (PRILOSEC) 40 MG capsule Take 40 mg by mouth daily.    Historical Provider, MD   BP 165/144 mmHg  Pulse 99  Resp 16  SpO2 99%  LMP 07/03/2015 Physical Exam  Constitutional: She is oriented to person, place, and time. She appears well-developed and well-nourished.  No distress.  HENT:  Head: Normocephalic and atraumatic.  Mouth/Throat: Oropharynx is clear and moist.  Eyes: Conjunctivae and EOM are normal. Pupils are equal, round, and reactive to light.  Neck: Normal range of motion. Neck supple.  Cardiovascular: Normal rate, regular rhythm and normal heart sounds.   No murmur heard. Pulmonary/Chest: Effort normal and breath sounds normal. No respiratory distress.  Abdominal: Soft. Bowel sounds are normal. There is no tenderness.  Musculoskeletal: Normal range of motion. She exhibits no edema.  Neurological: She is alert and oriented to person, place, and time. No cranial nerve deficit. She exhibits normal muscle tone. Coordination normal.  Skin: Skin is warm. No rash noted.  Nursing note and vitals reviewed.   ED Course  Procedures (including critical care time) Labs Review Labs Reviewed  CBC - Abnormal; Notable for the following:    Hemoglobin 11.5 (*)    HCT 33.8 (*)    MCV 74.4 (*)    MCH 25.3 (*)    All other components within normal limits  COMPREHENSIVE METABOLIC PANEL - Abnormal; Notable for the following:    Sodium 131 (*)    Glucose, Bld 453 (*)    Albumin 3.4 (*)    All other components within normal limits  CBG MONITORING, ED - Abnormal; Notable for the following:    Glucose-Capillary 441 (*)    All other components within normal limits  CBG MONITORING, ED - Abnormal; Notable for the following:    Glucose-Capillary 365 (*)    All other components within normal limits  TROPONIN I  PROTIME-INR    Imaging Review Dg Chest 2 View  09/02/2015  CLINICAL DATA:  Acute chest pain. EXAM: CHEST  2 VIEW COMPARISON:  October 05, 2014. FINDINGS: The heart size and mediastinal contours are within normal limits. Both lungs are clear. No pneumothorax or pleural effusion is noted. The visualized skeletal structures are unremarkable. IMPRESSION: No active cardiopulmonary disease. Electronically Signed   By: Marijo Conception, M.D.   On: 09/02/2015  10:06   I have personally reviewed and evaluated these images and lab results as part of my medical decision-making.   EKG Interpretation   Date/Time:  Wednesday Sep 02 2015 08:50:45 EDT Ventricular Rate:  105 PR Interval:  173 QRS Duration: 89 QT Interval:  342 QTC Calculation: 452 R Axis:   -31 Text Interpretation:  Sinus tachycardia Left axis deviation Low voltage,  precordial leads Borderline T wave abnormalities Confirmed by Alma Muegge   MD, Sonjia Wilcoxson ([id number]) on 09/02/2015 9:06:55 AM      MDM   Final diagnoses:  Chest pain, unspecified chest pain type  Hyperglycemia    Patient presented with the complaint of substernal left-sided chest pain since Wednesday a week ago. It has been constant. Workup for that EKG without acute findings troponin is negative so is not consistent with an acute cardiac event or unstable angina. Patient also states that she's been  out of her insulin for the past several weeks. She lost her primary care doctor. Patient's blood sugar was elevated. Patient blood sugar improved here with some IV insulin. We'll renew her sliding scale and her Toujeo. Patient nontoxic no acute distress.    Fredia Sorrow, MD 09/02/15 1139

## 2016-01-29 ENCOUNTER — Encounter (HOSPITAL_BASED_OUTPATIENT_CLINIC_OR_DEPARTMENT_OTHER): Payer: Self-pay | Admitting: *Deleted

## 2016-01-29 ENCOUNTER — Emergency Department (HOSPITAL_BASED_OUTPATIENT_CLINIC_OR_DEPARTMENT_OTHER)
Admission: EM | Admit: 2016-01-29 | Discharge: 2016-01-29 | Disposition: A | Discharge status: Home / Self Care | Payer: Medicaid Other | Attending: Emergency Medicine | Admitting: Emergency Medicine

## 2016-01-29 DIAGNOSIS — I10 Essential (primary) hypertension: Secondary | ICD-10-CM | POA: Insufficient documentation

## 2016-01-29 DIAGNOSIS — R519 Headache, unspecified: Secondary | ICD-10-CM

## 2016-01-29 DIAGNOSIS — R112 Nausea with vomiting, unspecified: Secondary | ICD-10-CM | POA: Diagnosis present

## 2016-01-29 DIAGNOSIS — R51 Headache: Secondary | ICD-10-CM

## 2016-01-29 DIAGNOSIS — Z794 Long term (current) use of insulin: Secondary | ICD-10-CM | POA: Diagnosis not present

## 2016-01-29 DIAGNOSIS — J45909 Unspecified asthma, uncomplicated: Secondary | ICD-10-CM | POA: Insufficient documentation

## 2016-01-29 DIAGNOSIS — E1165 Type 2 diabetes mellitus with hyperglycemia: Secondary | ICD-10-CM | POA: Diagnosis not present

## 2016-01-29 DIAGNOSIS — Z79899 Other long term (current) drug therapy: Secondary | ICD-10-CM | POA: Diagnosis not present

## 2016-01-29 DIAGNOSIS — M791 Myalgia: Secondary | ICD-10-CM | POA: Diagnosis not present

## 2016-01-29 DIAGNOSIS — Z7982 Long term (current) use of aspirin: Secondary | ICD-10-CM | POA: Diagnosis not present

## 2016-01-29 LAB — CBC WITH DIFFERENTIAL/PLATELET
BASOS ABS: 0 10*3/uL (ref 0.0–0.1)
Basophils Relative: 0 %
EOS PCT: 1 %
Eosinophils Absolute: 0.1 10*3/uL (ref 0.0–0.7)
HEMATOCRIT: 36.5 % (ref 36.0–46.0)
Hemoglobin: 12.5 g/dL (ref 12.0–15.0)
LYMPHS ABS: 1.9 10*3/uL (ref 0.7–4.0)
LYMPHS PCT: 29 %
MCH: 27.1 pg (ref 26.0–34.0)
MCHC: 34.2 g/dL (ref 30.0–36.0)
MCV: 79.2 fL (ref 78.0–100.0)
MONO ABS: 0.6 10*3/uL (ref 0.1–1.0)
MONOS PCT: 8 %
NEUTROS ABS: 4.1 10*3/uL (ref 1.7–7.7)
Neutrophils Relative %: 62 %
PLATELETS: 213 10*3/uL (ref 150–400)
RBC: 4.61 MIL/uL (ref 3.87–5.11)
RDW: 13.9 % (ref 11.5–15.5)
WBC: 6.6 10*3/uL (ref 4.0–10.5)

## 2016-01-29 LAB — COMPREHENSIVE METABOLIC PANEL
ALT: 15 U/L (ref 14–54)
ANION GAP: 9 (ref 5–15)
AST: 20 U/L (ref 15–41)
Albumin: 3.5 g/dL (ref 3.5–5.0)
Alkaline Phosphatase: 58 U/L (ref 38–126)
BILIRUBIN TOTAL: 0.5 mg/dL (ref 0.3–1.2)
BUN: 12 mg/dL (ref 6–20)
CO2: 24 mmol/L (ref 22–32)
Calcium: 8.4 mg/dL — ABNORMAL LOW (ref 8.9–10.3)
Chloride: 100 mmol/L — ABNORMAL LOW (ref 101–111)
Creatinine, Ser: 0.77 mg/dL (ref 0.44–1.00)
GFR calc non Af Amer: >60 mL/min (ref >60)
GLUCOSE: 388 mg/dL — AB (ref 65–99)
POTASSIUM: 3.4 mmol/L — AB (ref 3.5–5.1)
Sodium: 133 mmol/L — ABNORMAL LOW (ref 135–145)
TOTAL PROTEIN: 7.7 g/dL (ref 6.5–8.1)

## 2016-01-29 LAB — URINALYSIS, ROUTINE W REFLEX MICROSCOPIC
BILIRUBIN URINE: NEGATIVE
HGB URINE DIPSTICK: NEGATIVE
KETONES UR: NEGATIVE mg/dL
Leukocytes, UA: NEGATIVE
Nitrite: NEGATIVE
PH: 5.5 (ref 5.0–8.0)
Specific Gravity, Urine: 1.022 (ref 1.005–1.030)

## 2016-01-29 LAB — URINE MICROSCOPIC-ADD ON

## 2016-01-29 LAB — CBG MONITORING, ED: GLUCOSE-CAPILLARY: 368 mg/dL — AB (ref 65–99)

## 2016-01-29 MED ORDER — INSULIN GLARGINE 300 UNIT/ML ~~LOC~~ SOPN: 90.0000 [IU] | PEN_INJECTOR | Freq: Two times a day (BID) | SUBCUTANEOUS | 0 refills | Status: AC

## 2016-01-29 MED ORDER — SODIUM CHLORIDE 0.9 % IV BOLUS (SEPSIS)
1000.0000 mL | Freq: Once | INTRAVENOUS | Status: AC
  Administered 2016-01-29: 1000 mL via INTRAVENOUS

## 2016-01-29 MED ORDER — KETOROLAC TROMETHAMINE 30 MG/ML IJ SOLN
30.0000 mg | Freq: Once | INTRAMUSCULAR | Status: AC
  Administered 2016-01-29: 30 mg via INTRAVENOUS
  Filled 2016-01-29: qty 1

## 2016-01-29 MED ORDER — METOCLOPRAMIDE HCL 5 MG/ML IJ SOLN
10.0000 mg | Freq: Once | INTRAMUSCULAR | Status: AC
  Administered 2016-01-29: 10 mg via INTRAVENOUS
  Filled 2016-01-29: qty 2

## 2016-01-29 MED ORDER — DIPHENHYDRAMINE HCL 50 MG/ML IJ SOLN
25.0000 mg | Freq: Once | INTRAMUSCULAR | Status: AC
  Administered 2016-01-29: 25 mg via INTRAVENOUS
  Filled 2016-01-29: qty 1

## 2016-01-29 NOTE — ED Triage Notes (Signed)
Pt reports nausea x 4 days "I think it is my nerves, my anxiety is real bad this week." pt reports blood sugars this week have been over 400 "stress does that to me."

## 2016-01-29 NOTE — ED Provider Notes (Signed)
[place] DEPT [facility] Provider Note   CSN: [id number] Arrival date & time: 01/29/16  1021     History   Chief Complaint Chief Complaint  Patient presents with  . Nausea    HPI Judy English is a 55 y.o. female who presents with generalized malaise. PMH significant for DM on insulin therapy, anxiety/depression, fibromyalgia, HTN. She states that over the past week she has felt "off". She reports a throbbing headache. Reports a hx of migraine headaches which she feels that it is similar to. Came on gradually and has persisted. She also reports high blood sugar readings over the past week which she also has a hx of and has been out of her basal insulin for the past 3-4 days. She reports associated generalized myalgias, N/V. She tried to go to her PCP's office but their computer system was down and therefore would not be able to see her. She denies fever, chills, chest pain, SOB, abdominal pain, diarrhea/constipation, irritative voiding symptoms, vaginal discharge or bleeding.  HPI  Past Medical History:  Diagnosis Date  . Anxiety   . Asthma   . Depression   . Diabetes mellitus   . Fibromyalgia   . Heart attack   . Hypertension   . Neuropathy [facility])     Patient Active Problem List   Diagnosis Date Noted  . DIABETES MELLITUS, TYPE II, WITHOUT COMPLICATIONS [id number]  . MORBID OBESITY 04/28/2008  . DEPRESSION 04/28/2008  . HYPERTENSION, ESSENTIAL 04/28/2008  . ASTHMA, UNSPECIFIED, UNSPECIFIED STATUS 04/28/2008  . GERD 04/28/2008  . DEGENERATIVE JOINT DISEASE, BOTH KNEES, SEVERE 04/28/2008  . SPONDYLOSIS, CERVICAL, WITHOUT MYELOPATHY 04/28/2008  . MYOFASCIAL PAIN SYNDROME 04/28/2008  . OBSTRUCTIVE SLEEP APNEA 05/02/2007    Past Surgical History:  Procedure Laterality Date  . CESAREAN SECTION    . UTERINE FIBROID SURGERY      OB History    No data available       Home Medications    Prior to Admission medications   Medication Sig Start Date End Date Taking?  Authorizing Provider  albuterol (PROVENTIL HFA;VENTOLIN HFA) 108 (90 BASE) MCG/ACT inhaler Inhale 2 puffs into the lungs every 6 (six) hours as needed. For shortness of breath and wheezing     Historical Provider, MD  aspirin 325 MG EC tablet Take 325 mg by mouth daily.      Historical Provider, MD  beclomethasone (QVAR) 80 MCG/ACT inhaler Inhale 2 puffs into the lungs 2 (two) times daily.    Historical Provider, MD  buPROPion (WELLBUTRIN) 75 MG tablet Take 75 mg by mouth daily.    Historical Provider, MD  clonazePAM (KLONOPIN) 0.5 MG tablet Take 0.5 mg by mouth 3 (three) times daily as needed for anxiety.    Historical Provider, MD  FLUoxetine (PROZAC) 40 MG capsule Take 40 mg by mouth daily.    Historical Provider, MD  Fluticasone-Salmeterol (ADVAIR) 500-50 MCG/DOSE AEPB Inhale 1 puff into the lungs every 12 (twelve) hours.      Historical Provider, MD  insulin aspart (NOVOLOG) 100 UNIT/ML injection Inject 10 Units into the skin 4 (four) times daily as needed for high blood sugar (sliding scale). 09/02/15   Fredia Sorrow, MD  Insulin Glargine (TOUJEO SOLOSTAR) 300 UNIT/ML SOPN Inject 90 Units into the skin 2 (two) times daily. 09/02/15   Fredia Sorrow, MD  mometasone (NASONEX) 50 MCG/ACT nasal spray Place 2 sprays into both nostrils daily.      Historical Provider, MD  omeprazole (PRILOSEC) 40 MG capsule Take 40 mg  by mouth daily.    Historical Provider, MD  oxycodone (OXY-IR) 5 MG capsule Take 5 mg by mouth every 4 (four) hours as needed. For pain     Historical Provider, MD  traMADol (ULTRAM) 50 MG tablet Take 1 tablet (50 mg total) by mouth every 6 (six) hours as needed. 09/02/15   Fredia Sorrow, MD    Family History History reviewed. No pertinent family history.  Social History Social History  Substance Use Topics  . Smoking status: Never Smoker  . Smokeless tobacco: Never Used  . Alcohol use No     Allergies   Oxycodone-aspirin   Review of Systems Review of Systems    Constitutional: Negative for chills and fever.  Respiratory: Negative for shortness of breath.   Cardiovascular: Negative for chest pain.  Gastrointestinal: Positive for nausea and vomiting. Negative for abdominal pain.  Endocrine: Positive for polyuria.  Musculoskeletal: Positive for myalgias.  Neurological: Positive for headaches. Negative for syncope, weakness and light-headedness.  All other systems reviewed and are negative.    Physical Exam Updated Vital Signs BP (!) 179/101 (BP Location: Right Arm) Comment: not been able to take BP medication.  Pulse 94   Temp 98.3 F (36.8 C) (Oral)   Resp 20   Ht 5\' 7"  (1.702 m)   Wt 133.8 kg   SpO2 98%   BMI 46.20 kg/m   Physical Exam  Constitutional: She is oriented to person, place, and time. She appears well-developed and well-nourished. No distress.  Obese, NAD  HENT:  Head: Normocephalic and atraumatic.  Eyes: Conjunctivae are normal. Pupils are equal, round, and reactive to light. Right eye exhibits no discharge. Left eye exhibits no discharge. No scleral icterus.  Neck: Normal range of motion. Neck supple.  Cardiovascular: Normal rate and regular rhythm.  Exam reveals no gallop and no friction rub.   No murmur heard. Pulmonary/Chest: Effort normal and breath sounds normal. No respiratory distress. She has no wheezes. She has no rales. She exhibits no tenderness.  Abdominal: Soft. Bowel sounds are normal. She exhibits no distension and no mass. There is no tenderness. There is no rebound and no guarding. No hernia.  Musculoskeletal: She exhibits no edema.  Neurological: She is alert and oriented to person, place, and time. No cranial nerve deficit. She exhibits normal muscle tone. Coordination normal.  Skin: Skin is warm and dry.  Psychiatric: She has a normal mood and affect. Her behavior is normal.  Nursing note and vitals reviewed.    ED Treatments / Results  Labs (all labs ordered are listed, but only abnormal  results are displayed) Labs Reviewed  URINALYSIS, ROUTINE W REFLEX MICROSCOPIC (NOT AT [facility]) - Abnormal; Notable for the following:       Result Value   APPearance CLOUDY (*)    Glucose, UA >1000 (*)    Protein, ur >300 (*)    All other components within normal limits  COMPREHENSIVE METABOLIC PANEL - Abnormal; Notable for the following:    Sodium 133 (*)    Potassium 3.4 (*)    Chloride 100 (*)    Glucose, Bld 388 (*)    Calcium 8.4 (*)    All other components within normal limits  URINE MICROSCOPIC-ADD ON - Abnormal; Notable for the following:    Squamous Epithelial / LPF 6-30 (*)    Bacteria, UA FEW (*)    All other components within normal limits  CBG MONITORING, ED - Abnormal; Notable for the following:    Glucose-Capillary 368 (*)  All other components within normal limits  CBC WITH DIFFERENTIAL/PLATELET    EKG  EKG Interpretation None       Radiology No results found.  Procedures Procedures (including critical care time)  Medications Ordered in ED Medications  sodium chloride 0.9 % bolus 1,000 mL (0 mLs Intravenous Stopped 01/29/16 1329)  ketorolac (TORADOL) 30 MG/ML injection 30 mg (30 mg Intravenous Given 01/29/16 1134)  metoCLOPramide (REGLAN) injection 10 mg (10 mg Intravenous Given 01/29/16 1130)  diphenhydrAMINE (BENADRYL) injection 25 mg (25 mg Intravenous Given 01/29/16 1133)     Initial Impression / Assessment and Plan / ED Course  I have reviewed the triage vital signs and the nursing notes.  Pertinent labs & imaging results that were available during my care of the patient were reviewed by me and considered in my medical decision making (see chart for details).  Clinical Course   55 year old female presents with headache and generalized malaise. She is hypertensive - has hx of HTN and has been out of her medicines. No red flag HA signs/symptoms. Migraine cocktail and IVF given. CBC is unremarkable. CMP remarkable for mild hyponatremia, hypokalemia,  hypochloremia, hypocalcemia. Glucose is 388 which is uncontrolled but at baseline when compared to previous readings. UA remarkable for >1000 glucose in urine and >300 protein.  On recheck, patient reports improvement in symptoms. She has a follow up appt on Monday with her PCP. Patient is NAD, non-toxic, with stable VS. Patient is informed of clinical course, understands medical decision making process, and agrees with plan. Opportunity for questions provided and all questions answered. Return precautions given.   Final Clinical Impressions(s) / ED Diagnoses   Final diagnoses:  Nonintractable headache, unspecified chronicity pattern, unspecified headache type  Type 2 diabetes mellitus with hyperglycemia, with long-term current use of insulin [facility])    New Prescriptions New Prescriptions   No medications on file     Recardo Evangelist, PA-C 01/29/16 [place], MD 02/01/16 [phone]

## 2016-02-26 ENCOUNTER — Encounter (HOSPITAL_BASED_OUTPATIENT_CLINIC_OR_DEPARTMENT_OTHER): Payer: Self-pay | Admitting: *Deleted

## 2016-02-26 ENCOUNTER — Emergency Department (HOSPITAL_BASED_OUTPATIENT_CLINIC_OR_DEPARTMENT_OTHER)
Admission: EM | Admit: 2016-02-26 | Discharge: 2016-02-26 | Disposition: A | Discharge status: Home / Self Care | Payer: Medicaid Other | Attending: Emergency Medicine | Admitting: Emergency Medicine

## 2016-02-26 DIAGNOSIS — E1165 Type 2 diabetes mellitus with hyperglycemia: Secondary | ICD-10-CM | POA: Insufficient documentation

## 2016-02-26 DIAGNOSIS — Z7951 Long term (current) use of inhaled steroids: Secondary | ICD-10-CM | POA: Diagnosis not present

## 2016-02-26 DIAGNOSIS — Z79899 Other long term (current) drug therapy: Secondary | ICD-10-CM | POA: Insufficient documentation

## 2016-02-26 DIAGNOSIS — N390 Urinary tract infection, site not specified: Secondary | ICD-10-CM | POA: Diagnosis not present

## 2016-02-26 DIAGNOSIS — Z7982 Long term (current) use of aspirin: Secondary | ICD-10-CM | POA: Insufficient documentation

## 2016-02-26 DIAGNOSIS — I10 Essential (primary) hypertension: Secondary | ICD-10-CM | POA: Insufficient documentation

## 2016-02-26 DIAGNOSIS — Z794 Long term (current) use of insulin: Secondary | ICD-10-CM | POA: Diagnosis not present

## 2016-02-26 DIAGNOSIS — J45909 Unspecified asthma, uncomplicated: Secondary | ICD-10-CM | POA: Diagnosis not present

## 2016-02-26 LAB — COMPREHENSIVE METABOLIC PANEL
ALT: 15 U/L (ref 14–54)
AST: 19 U/L (ref 15–41)
Albumin: 3.4 g/dL — ABNORMAL LOW (ref 3.5–5.0)
Alkaline Phosphatase: 59 U/L (ref 38–126)
Anion gap: 9 (ref 5–15)
BUN: 16 mg/dL (ref 6–20)
CHLORIDE: 98 mmol/L — AB (ref 101–111)
CO2: 27 mmol/L (ref 22–32)
CREATININE: 0.92 mg/dL (ref 0.44–1.00)
Calcium: 8.8 mg/dL — ABNORMAL LOW (ref 8.9–10.3)
GFR calc non Af Amer: >60 mL/min (ref >60)
Glucose, Bld: 371 mg/dL — ABNORMAL HIGH (ref 65–99)
Potassium: 3.8 mmol/L (ref 3.5–5.1)
SODIUM: 134 mmol/L — AB (ref 135–145)
Total Bilirubin: 0.2 mg/dL — ABNORMAL LOW (ref 0.3–1.2)
Total Protein: 7.5 g/dL (ref 6.5–8.1)

## 2016-02-26 LAB — URINALYSIS, ROUTINE W REFLEX MICROSCOPIC
Bilirubin Urine: NEGATIVE
Glucose, UA: >1000 mg/dL — AB
HGB URINE DIPSTICK: NEGATIVE
Ketones, ur: NEGATIVE mg/dL
LEUKOCYTES UA: NEGATIVE
Nitrite: NEGATIVE
Protein, ur: 100 mg/dL — AB
SPECIFIC GRAVITY, URINE: 1.02 (ref 1.005–1.030)
pH: 5 (ref 5.0–8.0)

## 2016-02-26 LAB — CBC WITH DIFFERENTIAL/PLATELET
BASOS ABS: 0 10*3/uL (ref 0.0–0.1)
BASOS PCT: 0 %
EOS ABS: 0.1 10*3/uL (ref 0.0–0.7)
EOS PCT: 1 %
HCT: 36.2 % (ref 36.0–46.0)
Hemoglobin: 12.4 g/dL (ref 12.0–15.0)
Lymphocytes Relative: 28 %
Lymphs Abs: 2.7 10*3/uL (ref 0.7–4.0)
MCH: 27.5 pg (ref 26.0–34.0)
MCHC: 34.3 g/dL (ref 30.0–36.0)
MCV: 80.3 fL (ref 78.0–100.0)
Monocytes Absolute: 0.8 10*3/uL (ref 0.1–1.0)
Monocytes Relative: 9 %
Neutro Abs: 5.9 10*3/uL (ref 1.7–7.7)
Neutrophils Relative %: 62 %
PLATELETS: 230 10*3/uL (ref 150–400)
RBC: 4.51 MIL/uL (ref 3.87–5.11)
RDW: 13.3 % (ref 11.5–15.5)
WBC: 9.6 10*3/uL (ref 4.0–10.5)

## 2016-02-26 LAB — URINE MICROSCOPIC-ADD ON: RBC / HPF: NONE SEEN RBC/hpf (ref 0–5)

## 2016-02-26 LAB — LIPASE, BLOOD: Lipase: 24 U/L (ref 11–51)

## 2016-02-26 LAB — CBG MONITORING, ED: GLUCOSE-CAPILLARY: 351 mg/dL — AB (ref 65–99)

## 2016-02-26 MED ORDER — CEPHALEXIN 500 MG PO CAPS: ORAL_CAPSULE | ORAL | 0 refills | Status: DC

## 2016-02-26 MED ORDER — INSULIN ASPART 100 UNIT/ML ~~LOC~~ SOLN
15.0000 [IU] | Freq: Once | SUBCUTANEOUS | Status: AC
  Administered 2016-02-26: 15 [IU] via SUBCUTANEOUS
  Filled 2016-02-26: qty 1

## 2016-02-26 MED ORDER — INSULIN ASPART 100 UNIT/ML ~~LOC~~ SOLN
SUBCUTANEOUS | Status: AC
  Filled 2016-02-26: qty 1

## 2016-02-26 MED FILL — CEPHALEXIN 500 MG CAPSULE: 500 | 7 days supply | Qty: 28 | Fill #0

## 2016-02-26 NOTE — ED Provider Notes (Signed)
[place] DEPT [facility] Provider Note   CSN: [id number] Arrival date & time: 02/26/16  1142     History   Chief Complaint Chief Complaint  Patient presents with  . Nausea    HPI Judy English is a 55 y.o. female.  HPI   55 year old female with history of diabetes, hypertension, anxiety, fibromyalgia presenting for evaluation of nausea. Patient states she has long-standing type 2 diabetes. Mentioned that it is not well controlled because she spends a lot of her NG taking care of her family members and "I let go of myself" for the past several months she has noticed that her blood sugar has been elevated in the 3 to 400s. She has been compliant with her medication including insulin. She is trying to "get back on track" by monitoring her food intake and thinking about exercise. Today when she went to get her regular blood work, a Marine scientist was asking her if she is doing okay and patient states "I don't feel well" therefore patient was recommended to come to ER for further evaluation. Patient denies having fever, chills, severe headache, neck pain, URI symptoms, chest pain, shortness of breath, dysuria, focal numbness. She does complain of generalized fatigue, polyuria, polydipsia, and chronic abdominal pain. She endorse occasional nausea vomiting and diarrhea.  Past Medical History:  Diagnosis Date  . Anxiety   . Asthma   . Depression   . Diabetes mellitus   . Fibromyalgia   . Heart attack   . Hypertension   . Neuropathy [facility])     Patient Active Problem List   Diagnosis Date Noted  . DIABETES MELLITUS, TYPE II, WITHOUT COMPLICATIONS [id number]  . MORBID OBESITY 04/28/2008  . DEPRESSION 04/28/2008  . HYPERTENSION, ESSENTIAL 04/28/2008  . ASTHMA, UNSPECIFIED, UNSPECIFIED STATUS 04/28/2008  . GERD 04/28/2008  . DEGENERATIVE JOINT DISEASE, BOTH KNEES, SEVERE 04/28/2008  . SPONDYLOSIS, CERVICAL, WITHOUT MYELOPATHY 04/28/2008  . MYOFASCIAL PAIN SYNDROME 04/28/2008  . OBSTRUCTIVE  SLEEP APNEA 05/02/2007    Past Surgical History:  Procedure Laterality Date  . CESAREAN SECTION    . UTERINE FIBROID SURGERY      OB History    No data available       Home Medications    Prior to Admission medications   Medication Sig Start Date End Date Taking? Authorizing Provider  albuterol (PROVENTIL HFA;VENTOLIN HFA) 108 (90 BASE) MCG/ACT inhaler Inhale 2 puffs into the lungs every 6 (six) hours as needed. For shortness of breath and wheezing     Historical Provider, MD  aspirin 325 MG EC tablet Take 325 mg by mouth daily.      Historical Provider, MD  beclomethasone (QVAR) 80 MCG/ACT inhaler Inhale 2 puffs into the lungs 2 (two) times daily.    Historical Provider, MD  buPROPion (WELLBUTRIN) 75 MG tablet Take 75 mg by mouth daily.    Historical Provider, MD  clonazePAM (KLONOPIN) 0.5 MG tablet Take 0.5 mg by mouth 3 (three) times daily as needed for anxiety.    Historical Provider, MD  FLUoxetine (PROZAC) 40 MG capsule Take 40 mg by mouth daily.    Historical Provider, MD  Fluticasone-Salmeterol (ADVAIR) 500-50 MCG/DOSE AEPB Inhale 1 puff into the lungs every 12 (twelve) hours.      Historical Provider, MD  insulin aspart (NOVOLOG) 100 UNIT/ML injection Inject 10 Units into the skin 4 (four) times daily as needed for high blood sugar (sliding scale). 09/02/15   Fredia Sorrow, MD  Insulin Glargine (TOUJEO SOLOSTAR) 300 UNIT/ML SOPN Inject 90  Units into the skin 2 (two) times daily. 01/29/16   Recardo Evangelist, PA-C  mometasone (NASONEX) 50 MCG/ACT nasal spray Place 2 sprays into both nostrils daily.      Historical Provider, MD  omeprazole (PRILOSEC) 40 MG capsule Take 40 mg by mouth daily.    Historical Provider, MD  oxycodone (OXY-IR) 5 MG capsule Take 5 mg by mouth every 4 (four) hours as needed. For pain     Historical Provider, MD  traMADol (ULTRAM) 50 MG tablet Take 1 tablet (50 mg total) by mouth every 6 (six) hours as needed. 09/02/15   Fredia Sorrow, MD    Family  History No family history on file.  Social History Social History  Substance Use Topics  . Smoking status: Never Smoker  . Smokeless tobacco: Never Used  . Alcohol use No     Allergies   Oxycodone-aspirin   Review of Systems Review of Systems  All other systems reviewed and are negative.    Physical Exam Updated Vital Signs BP 157/93 (BP Location: Left Arm)   Pulse 100   Temp 98.1 F (36.7 C) (Oral)   Resp 20   Ht 5\' 7"  (1.702 m)   Wt 134.7 kg   SpO2 96%   BMI 46.52 kg/m   Physical Exam  Constitutional: She is oriented to person, place, and time. She appears well-developed and well-nourished. No distress.  Morbidly obese female in no acute discomfort, nontoxic  HENT:  Head: Atraumatic.  Mouth/Throat: Oropharynx is clear and moist.  Eyes: Conjunctivae are normal.  Neck: Neck supple.  Cardiovascular: Normal rate and regular rhythm.   Pulmonary/Chest: Effort normal and breath sounds normal.  Abdominal: Soft. She exhibits no distension. There is no tenderness.  Neurological: She is alert and oriented to person, place, and time.  Skin: Capillary refill takes less than 2 seconds. No rash noted.  Psychiatric: She has a normal mood and affect.  Nursing note and vitals reviewed.    ED Treatments / Results  Labs (all labs ordered are listed, but only abnormal results are displayed) Labs Reviewed  COMPREHENSIVE METABOLIC PANEL - Abnormal; Notable for the following:       Result Value   Sodium 134 (*)    Chloride 98 (*)    Glucose, Bld 371 (*)    Calcium 8.8 (*)    Albumin 3.4 (*)    Total Bilirubin 0.2 (*)    All other components within normal limits  URINALYSIS, ROUTINE W REFLEX MICROSCOPIC (NOT AT [facility]) - Abnormal; Notable for the following:    APPearance CLOUDY (*)    Glucose, UA >1000 (*)    Protein, ur 100 (*)    All other components within normal limits  URINE MICROSCOPIC-ADD ON - Abnormal; Notable for the following:    Squamous Epithelial / LPF 6-30  (*)    Bacteria, UA MANY (*)    All other components within normal limits  CBG MONITORING, ED - Abnormal; Notable for the following:    Glucose-Capillary 351 (*)    All other components within normal limits  CBC WITH DIFFERENTIAL/PLATELET  LIPASE, BLOOD  CBG MONITORING, ED    EKG  EKG Interpretation None       Radiology No results found.  Procedures Procedures (including critical care time)  Medications Ordered in ED Medications  insulin aspart (novoLOG) 100 UNIT/ML injection (0 Units  Hold 02/26/16 1446)  insulin aspart (novoLOG) injection 15 Units (15 Units Subcutaneous Given 02/26/16 1442)     Initial  Impression / Assessment and Plan / ED Course  I have reviewed the triage vital signs and the nursing notes.  Pertinent labs & imaging results that were available during my care of the patient were reviewed by me and considered in my medical decision making (see chart for details).  Clinical Course    BP 116/87 (BP Location: Right Arm)   Pulse 99   Temp 98.1 F (36.7 C) (Oral)   Resp 20   Ht 5\' 7"  (1.702 m)   Wt 134.7 kg   SpO2 96%   BMI 46.52 kg/m    Final Clinical Impressions(s) / ED Diagnoses   Final diagnoses:  Type 2 diabetes mellitus with hyperglycemia, with long-term current use of insulin (HCC)  Lower urinary tract infectious disease    New Prescriptions New Prescriptions   CEPHALEXIN (KEFLEX) 500 MG CAPSULE    2 caps po bid x 7 days   2:20 PM Patient here with complaints of polyuria, polydipsia, uncontrolled diabetes despite being on medication and admits that she has not been managing her health appropriately. She does not describe any symptoms to suggest infectious etiology.   3:51 PM Patient initial CBG is 371 without any anion gap to suggest DKA. Received 15 units of insulin and her blood sugar did improve mildly. Her urine shows potential UTI. Question patient and she does report mild increased urinary frequency although this could likely  be secondary to polydipsia and polyuria, patient requests to be treated for potential urinary tract infection. Patient will be treated with Keflex. I encouraged patient to follow-up closely with her primary care provider for further management of her diabetes. She will need to monitor her diet, start exercising, and take the medication appropriately. Return precaution discussed. Patient voiced understanding and agrees with plan.    Domenic Moras, PA-C 02/26/16 [place], MD 02/26/16 [phone]

## 2016-02-26 NOTE — ED Triage Notes (Signed)
Nausea. She had routine blood draw at her doctors office this am and was told to come here for an evaluation.

## 2016-02-26 NOTE — Discharge Instructions (Signed)
Your blood sugar is elevated.  Please follow up closely with your primary care provider for close monitoring and further management.  You will need to monitor your diet carefully, work out at least 30 minutes daily, take your medication appropriately.  You may have a urinary tract infection, take antibiotic as prescribed for the full duration.  Return to the ER if you have any concerns.

## 2016-04-04 ENCOUNTER — Encounter (HOSPITAL_BASED_OUTPATIENT_CLINIC_OR_DEPARTMENT_OTHER): Payer: Self-pay | Admitting: *Deleted

## 2016-04-04 ENCOUNTER — Emergency Department (HOSPITAL_BASED_OUTPATIENT_CLINIC_OR_DEPARTMENT_OTHER)
Admission: EM | Admit: 2016-04-04 | Discharge: 2016-04-04 | Disposition: A | Discharge status: Home / Self Care | Payer: Medicaid Other | Attending: Emergency Medicine | Admitting: Emergency Medicine

## 2016-04-04 ENCOUNTER — Emergency Department (HOSPITAL_BASED_OUTPATIENT_CLINIC_OR_DEPARTMENT_OTHER): Payer: Medicaid Other

## 2016-04-04 DIAGNOSIS — Z79899 Other long term (current) drug therapy: Secondary | ICD-10-CM | POA: Diagnosis not present

## 2016-04-04 DIAGNOSIS — Z7982 Long term (current) use of aspirin: Secondary | ICD-10-CM | POA: Diagnosis not present

## 2016-04-04 DIAGNOSIS — R1110 Vomiting, unspecified: Secondary | ICD-10-CM | POA: Insufficient documentation

## 2016-04-04 DIAGNOSIS — Z794 Long term (current) use of insulin: Secondary | ICD-10-CM | POA: Insufficient documentation

## 2016-04-04 DIAGNOSIS — I10 Essential (primary) hypertension: Secondary | ICD-10-CM | POA: Insufficient documentation

## 2016-04-04 DIAGNOSIS — M549 Dorsalgia, unspecified: Secondary | ICD-10-CM | POA: Diagnosis not present

## 2016-04-04 DIAGNOSIS — J45909 Unspecified asthma, uncomplicated: Secondary | ICD-10-CM | POA: Diagnosis not present

## 2016-04-04 DIAGNOSIS — J029 Acute pharyngitis, unspecified: Secondary | ICD-10-CM | POA: Insufficient documentation

## 2016-04-04 DIAGNOSIS — E119 Type 2 diabetes mellitus without complications: Secondary | ICD-10-CM | POA: Insufficient documentation

## 2016-04-04 DIAGNOSIS — R0602 Shortness of breath: Secondary | ICD-10-CM | POA: Insufficient documentation

## 2016-04-04 DIAGNOSIS — R05 Cough: Secondary | ICD-10-CM

## 2016-04-04 DIAGNOSIS — R059 Cough, unspecified: Secondary | ICD-10-CM

## 2016-04-04 LAB — URINALYSIS, ROUTINE W REFLEX MICROSCOPIC
Bilirubin Urine: NEGATIVE
Glucose, UA: 250 mg/dL — AB
KETONES UR: NEGATIVE mg/dL
LEUKOCYTES UA: NEGATIVE
NITRITE: NEGATIVE
Specific Gravity, Urine: 1.015 (ref 1.005–1.030)
pH: 5.5 (ref 5.0–8.0)

## 2016-04-04 LAB — URINALYSIS, MICROSCOPIC (REFLEX)

## 2016-04-04 MED ORDER — HYDROCODONE-ACETAMINOPHEN 5-325 MG PO TABS
1.0000 | ORAL_TABLET | Freq: Once | ORAL | Status: AC
  Administered 2016-04-04: 1 via ORAL
  Filled 2016-04-04: qty 1

## 2016-04-04 MED ORDER — ALBUTEROL SULFATE (2.5 MG/3ML) 0.083% IN NEBU
5.0000 mg | INHALATION_SOLUTION | Freq: Once | RESPIRATORY_TRACT | Status: AC
  Administered 2016-04-04: 5 mg via RESPIRATORY_TRACT
  Filled 2016-04-04: qty 6

## 2016-04-04 NOTE — ED Notes (Signed)
Report received, pt care assumed. Pt resting quielty in nad, denies any c/o or needs at this time. Updated on plan of care.

## 2016-04-04 NOTE — ED Notes (Signed)
EDP at BS 

## 2016-04-04 NOTE — ED Triage Notes (Addendum)
Poor historian, c/o multiple vague sx. C/o cough, sore throat, onset ~4d ago, mentions vomiting (none in last 24 hrs). (denies: ear issues, fever). Alert, NAD, calm, interactive, resps e/u, no dyspnea noted, easily exacerbated frequent cough noted. Family present.

## 2016-04-04 NOTE — ED Provider Notes (Signed)
[place] DEPT [facility] Provider Note   CSN: [id number] Arrival date & time: 04/04/16  0556     History   Chief Complaint Chief Complaint  Patient presents with  . Cough    HPI Judy English is a 55 y.o. female.  The history is provided by the patient.  Cough  This is a new problem. The current episode started more than 2 days ago. The problem occurs every few minutes. The problem has been gradually worsening. The cough is productive of blood-tinged sputum. Associated symptoms include chills, sore throat, myalgias and shortness of breath. Pertinent negatives include no chest pain.  Patient with h/o asthma presents for cough for "some days" She indicates for about 4 days she has had persistent cough with whitish/blood tinged sputum She now has pain in right side of her back from cough - it hurts worse with movement/palpation/cough She reports shortness of breath She has sore throat She has had some vomiting No anterior chest pain No new LE edema She is a nonsmoker  She reports med compliance with her diabetic meds Past Medical History:  Diagnosis Date  . Anxiety   . Asthma   . Depression   . Diabetes mellitus   . Fibromyalgia   . Heart attack   . Hypertension   . Neuropathy [facility])     Patient Active Problem List   Diagnosis Date Noted  . DIABETES MELLITUS, TYPE II, WITHOUT COMPLICATIONS [id number]  . MORBID OBESITY 04/28/2008  . DEPRESSION 04/28/2008  . HYPERTENSION, ESSENTIAL 04/28/2008  . ASTHMA, UNSPECIFIED, UNSPECIFIED STATUS 04/28/2008  . GERD 04/28/2008  . DEGENERATIVE JOINT DISEASE, BOTH KNEES, SEVERE 04/28/2008  . SPONDYLOSIS, CERVICAL, WITHOUT MYELOPATHY 04/28/2008  . MYOFASCIAL PAIN SYNDROME 04/28/2008  . OBSTRUCTIVE SLEEP APNEA 05/02/2007    Past Surgical History:  Procedure Laterality Date  . CESAREAN SECTION    . UTERINE FIBROID SURGERY      OB History    No data available       Home Medications    Prior to Admission medications    Medication Sig Start Date End Date Taking? Authorizing Provider  albuterol (PROVENTIL HFA;VENTOLIN HFA) 108 (90 BASE) MCG/ACT inhaler Inhale 2 puffs into the lungs every 6 (six) hours as needed. For shortness of breath and wheezing     Historical Provider, MD  aspirin 325 MG EC tablet Take 325 mg by mouth daily.      Historical Provider, MD  beclomethasone (QVAR) 80 MCG/ACT inhaler Inhale 2 puffs into the lungs 2 (two) times daily.    Historical Provider, MD  buPROPion (WELLBUTRIN) 75 MG tablet Take 75 mg by mouth daily.    Historical Provider, MD  cephALEXin (KEFLEX) 500 MG capsule 2 caps po bid x 7 days 02/26/16   Domenic Moras, PA-C  clonazePAM (KLONOPIN) 0.5 MG tablet Take 0.5 mg by mouth 3 (three) times daily as needed for anxiety.    Historical Provider, MD  FLUoxetine (PROZAC) 40 MG capsule Take 40 mg by mouth daily.    Historical Provider, MD  Fluticasone-Salmeterol (ADVAIR) 500-50 MCG/DOSE AEPB Inhale 1 puff into the lungs every 12 (twelve) hours.      Historical Provider, MD  insulin aspart (NOVOLOG) 100 UNIT/ML injection Inject 10 Units into the skin 4 (four) times daily as needed for high blood sugar (sliding scale). 09/02/15   Fredia Sorrow, MD  Insulin Glargine (TOUJEO SOLOSTAR) 300 UNIT/ML SOPN Inject 90 Units into the skin 2 (two) times daily. 01/29/16   Recardo Evangelist, PA-C  mometasone (  NASONEX) 50 MCG/ACT nasal spray Place 2 sprays into both nostrils daily.      Historical Provider, MD  omeprazole (PRILOSEC) 40 MG capsule Take 40 mg by mouth daily.    Historical Provider, MD  oxycodone (OXY-IR) 5 MG capsule Take 5 mg by mouth every 4 (four) hours as needed. For pain     Historical Provider, MD  traMADol (ULTRAM) 50 MG tablet Take 1 tablet (50 mg total) by mouth every 6 (six) hours as needed. 09/02/15   Fredia Sorrow, MD    Family History History reviewed. No pertinent family history.  Social History Social History  Substance Use Topics  . Smoking status: Never Smoker  .  Smokeless tobacco: Never Used  . Alcohol use No     Allergies   Oxycodone-aspirin   Review of Systems Review of Systems  Constitutional: Positive for chills.  HENT: Positive for sore throat.   Respiratory: Positive for cough and shortness of breath.   Cardiovascular: Negative for chest pain and leg swelling.  Gastrointestinal: Positive for vomiting.  Musculoskeletal: Positive for myalgias.  All other systems reviewed and are negative.    Physical Exam Updated Vital Signs BP (!) 171/110 (BP Location: Right Wrist)   Pulse 107   Temp 98.3 F (36.8 C) (Oral)   Resp 20   Ht 5\' 7"  (1.702 m)   Wt 129.3 kg   SpO2 98%   BMI 44.64 kg/m   Physical Exam CONSTITUTIONAL: Well developed/well nourished, no distress, she does not smell of ketones HEAD: Normocephalic/atraumatic EYES: EOMI/PERRL ENMT: Mucous membranes moist, uvula midline, minimal erythema, no exudates, no stridor, no drooling, no trismus NECK: supple no meningeal signs, no anterior neck/edema/tenderness SPINE/BACK:entire spine nontender CV: S1/S2 noted, no murmurs/rubs/gallops noted LUNGS: coughs frequently during exam, decreased BS noted bilaterally, limited by body habitus, no apparent distress She has tenderness along right posterior costal margin ABDOMEN: soft, nontender, obese [id number] cva tenderness NEURO: Pt is awake/alert/appropriate, moves all extremitiesx4.  No facial droop.   EXTREMITIES: pulses normal/equal, full ROM, no LE edema noted SKIN: warm, color normal PSYCH: no abnormalities of mood noted, alert and oriented to situation   ED Treatments / Results  Labs (all labs ordered are listed, but only abnormal results are displayed) Labs Reviewed  URINALYSIS, ROUTINE W REFLEX MICROSCOPIC - Abnormal; Notable for the following:       Result Value   APPearance CLOUDY (*)    Glucose, UA 250 (*)    Hgb urine dipstick SMALL (*)    Protein, ur >300 (*)    All other components within normal limits    URINALYSIS, MICROSCOPIC (REFLEX) - Abnormal; Notable for the following:    Bacteria, UA FEW (*)    Squamous Epithelial / LPF 0-5 (*)    All other components within normal limits    EKG  EKG Interpretation None       Radiology No results found.  Procedures Procedures (including critical care time)  Medications Ordered in ED Medications  albuterol (PROVENTIL) (2.5 MG/3ML) 0.083% nebulizer solution 5 mg (5 mg Nebulization Given 04/04/16 0628)  HYDROcodone-acetaminophen (NORCO/VICODIN) 5-325 MG per tablet 1 tablet (1 tablet Oral Given 04/04/16 [id number])     Initial Impression / Assessment and Plan / ED Course  I have reviewed the triage vital signs and the nursing notes.  Pertinent labs & imaging results that were available during my care of the patient were reviewed by me and considered in my medical decision making (see chart for details).  Clinical Course  6:26 AM Pt here with cough for several days, now with back pain worse with movement/palpation.  She mentioned tinged sputum, but while in the room she has mostly dry cough.  CXR ordered.  I doubt PE, likely infectious etiology.  Her exam is difficult due to body habitus, and since there is also CVAT, will check urine. 7:02 AM Pt improved Lung sounds clear Plan to discharge home We discussed strict return precautions Pt agreeable with plan Final Clinical Impressions(s) / ED Diagnoses   Final diagnoses:  Cough  Acute back pain, unspecified back location, unspecified back pain laterality    New Prescriptions New Prescriptions   No medications on file     Ripley Fraise, MD 04/04/16 [phone]

## 2016-04-04 NOTE — ED Notes (Signed)
Back from xray, no changes, alert, NAD, calm, coughing less.

## 2016-05-08 ENCOUNTER — Emergency Department (HOSPITAL_BASED_OUTPATIENT_CLINIC_OR_DEPARTMENT_OTHER): Payer: Medicaid Other

## 2016-05-08 ENCOUNTER — Encounter (HOSPITAL_BASED_OUTPATIENT_CLINIC_OR_DEPARTMENT_OTHER): Payer: Self-pay | Admitting: *Deleted

## 2016-05-08 ENCOUNTER — Emergency Department (HOSPITAL_BASED_OUTPATIENT_CLINIC_OR_DEPARTMENT_OTHER)
Admission: EM | Admit: 2016-05-08 | Discharge: 2016-05-08 | Disposition: A | Discharge status: Home / Self Care | Payer: Medicaid Other | Attending: Emergency Medicine | Admitting: Emergency Medicine

## 2016-05-08 DIAGNOSIS — J45909 Unspecified asthma, uncomplicated: Secondary | ICD-10-CM | POA: Diagnosis not present

## 2016-05-08 DIAGNOSIS — G8929 Other chronic pain: Secondary | ICD-10-CM | POA: Diagnosis not present

## 2016-05-08 DIAGNOSIS — E119 Type 2 diabetes mellitus without complications: Secondary | ICD-10-CM | POA: Diagnosis not present

## 2016-05-08 DIAGNOSIS — Z794 Long term (current) use of insulin: Secondary | ICD-10-CM | POA: Diagnosis not present

## 2016-05-08 DIAGNOSIS — M79644 Pain in right finger(s): Secondary | ICD-10-CM | POA: Insufficient documentation

## 2016-05-08 DIAGNOSIS — I10 Essential (primary) hypertension: Secondary | ICD-10-CM | POA: Diagnosis not present

## 2016-05-08 DIAGNOSIS — Z79899 Other long term (current) drug therapy: Secondary | ICD-10-CM | POA: Insufficient documentation

## 2016-05-08 DIAGNOSIS — Z7982 Long term (current) use of aspirin: Secondary | ICD-10-CM | POA: Diagnosis not present

## 2016-05-08 DIAGNOSIS — M25512 Pain in left shoulder: Secondary | ICD-10-CM | POA: Insufficient documentation

## 2016-05-08 LAB — CBG MONITORING, ED: GLUCOSE-CAPILLARY: 279 mg/dL — AB (ref 65–99)

## 2016-05-08 MED ORDER — HYDROMORPHONE HCL 1 MG/ML IJ SOLN
1.0000 mg | Freq: Once | INTRAMUSCULAR | Status: AC
  Administered 2016-05-08: 1 mg via INTRAMUSCULAR
  Filled 2016-05-08: qty 1

## 2016-05-08 MED ORDER — DIAZEPAM 5 MG/ML IJ SOLN
5.0000 mg | Freq: Once | INTRAMUSCULAR | Status: AC
  Administered 2016-05-08: 5 mg via INTRAMUSCULAR
  Filled 2016-05-08: qty 2

## 2016-05-08 NOTE — ED Notes (Signed)
Pt made aware to return if symptoms worsen or if any life threatening symptoms occur.   

## 2016-05-08 NOTE — ED Notes (Signed)
CBG 279 

## 2016-05-08 NOTE — ED Notes (Signed)
ED Provider at bedside. 

## 2016-05-08 NOTE — ED Notes (Signed)
Patient transported to X-ray 

## 2016-05-08 NOTE — ED Notes (Addendum)
Pt states no ride at this time , will wait for pain meds

## 2016-05-08 NOTE — ED Provider Notes (Signed)
[place] [facility] DEPT [facility] Provider Note   CSN: [id number] Arrival date & time: 05/08/16  1050     History   Chief Complaint Chief Complaint  Patient presents with  . Shoulder Pain    HPI Judy English is a 56 y.o. female hx of DM, fibromyalgia, HTN, Chronic shoulder pain here presenting with left arm pain, right hand pain. Patient states that for the last week or so, she's been having right hand pain. She states that her middle finger sometimes gets locked and more swollen. She denies any trauma to the finger. Also has progressive left shoulder and entire left upper extremity pain. She states that she has history of left rotator cuff injury and has been on Percocet for the last several years. She does have a pain doctor who prescribes her the Percocet. She states that she does have chronic left shoulder pain but it has gotten worse over the last week or so. She denies any chest pain or shortness of breath. She does have a history of diabetes but she states that her blood sugar usually runs around the 200s and denies any fevers or vomiting  The history is provided by the patient.    Past Medical History:  Diagnosis Date  . Anxiety   . Asthma   . Depression   . Diabetes mellitus   . Fibromyalgia   . Heart attack   . Hypertension   . Neuropathy [facility])     Patient Active Problem List   Diagnosis Date Noted  . DIABETES MELLITUS, TYPE II, WITHOUT COMPLICATIONS [id number]  . MORBID OBESITY 04/28/2008  . DEPRESSION 04/28/2008  . HYPERTENSION, ESSENTIAL 04/28/2008  . ASTHMA, UNSPECIFIED, UNSPECIFIED STATUS 04/28/2008  . GERD 04/28/2008  . DEGENERATIVE JOINT DISEASE, BOTH KNEES, SEVERE 04/28/2008  . SPONDYLOSIS, CERVICAL, WITHOUT MYELOPATHY 04/28/2008  . MYOFASCIAL PAIN SYNDROME 04/28/2008  . OBSTRUCTIVE SLEEP APNEA 05/02/2007    Past Surgical History:  Procedure Laterality Date  . CESAREAN SECTION    . UTERINE FIBROID SURGERY      OB History    No data available        Home Medications    Prior to Admission medications   Medication Sig Start Date End Date Taking? Authorizing Provider  albuterol (PROVENTIL HFA;VENTOLIN HFA) 108 (90 BASE) MCG/ACT inhaler Inhale 2 puffs into the lungs every 6 (six) hours as needed. For shortness of breath and wheezing     Historical Provider, MD  aspirin 325 MG EC tablet Take 325 mg by mouth daily.      Historical Provider, MD  beclomethasone (QVAR) 80 MCG/ACT inhaler Inhale 2 puffs into the lungs 2 (two) times daily.    Historical Provider, MD  buPROPion (WELLBUTRIN) 75 MG tablet Take 75 mg by mouth daily.    Historical Provider, MD  clonazePAM (KLONOPIN) 0.5 MG tablet Take 0.5 mg by mouth 3 (three) times daily as needed for anxiety.    Historical Provider, MD  FLUoxetine (PROZAC) 40 MG capsule Take 40 mg by mouth daily.    Historical Provider, MD  Fluticasone-Salmeterol (ADVAIR) 500-50 MCG/DOSE AEPB Inhale 1 puff into the lungs every 12 (twelve) hours.      Historical Provider, MD  insulin aspart (NOVOLOG) 100 UNIT/ML injection Inject 10 Units into the skin 4 (four) times daily as needed for high blood sugar (sliding scale). 09/02/15   Fredia Sorrow, MD  Insulin Glargine (TOUJEO SOLOSTAR) 300 UNIT/ML SOPN Inject 90 Units into the skin 2 (two) times daily. 01/29/16   Recardo Evangelist, PA-C  mometasone (NASONEX) 50 MCG/ACT nasal spray Place 2 sprays into both nostrils daily.      Historical Provider, MD  omeprazole (PRILOSEC) 40 MG capsule Take 40 mg by mouth daily.    Historical Provider, MD  oxycodone (OXY-IR) 5 MG capsule Take 5 mg by mouth every 4 (four) hours as needed. For pain     Historical Provider, MD  traMADol (ULTRAM) 50 MG tablet Take 1 tablet (50 mg total) by mouth every 6 (six) hours as needed. 09/02/15   Fredia Sorrow, MD    Family History History reviewed. No pertinent family history.  Social History Social History  Substance Use Topics  . Smoking status: Never Smoker  . Smokeless tobacco: Never  Used  . Alcohol use No     Allergies   Oxycodone-aspirin   Review of Systems Review of Systems  Musculoskeletal:       R hand, L upper extremity pain   All other systems reviewed and are negative.    Physical Exam Updated Vital Signs BP 146/100   Pulse 104   Temp 98.4 F (36.9 C)   Resp 18   Ht 5\' 7"  (1.702 m)   Wt 293 lb (132.9 kg)   LMP 04/08/2016   SpO2 99%   BMI 45.89 kg/m   Physical Exam  Constitutional:  Uncomfortable   HENT:  Head: Normocephalic.  Eyes: EOM are normal. Pupils are equal, round, and reactive to light.  Neck: Normal range of motion. Neck supple.  No midline cervical tenderness   Cardiovascular: Normal rate, regular rhythm and normal heart sounds.   Pulmonary/Chest: Effort normal and breath sounds normal. No respiratory distress. She has no wheezes.  Abdominal: Soft. Bowel sounds are normal. She exhibits no distension. There is no tenderness.  Musculoskeletal:  Mild tenderness and swelling R middle finger but nl capillary refill, nl ROM of the digit. Mild diffuse R hand tenderness but no obvious deformity. Dec ROM L shoulder but no obvious deformity. Mild diffuse tenderness L humerus and forearm but no obvious deformity. 2+ pulses, nl hand grasp bilaterally   Skin: Skin is warm.  Psychiatric: She has a normal mood and affect.  Nursing note and vitals reviewed.    ED Treatments / Results  Labs (all labs ordered are listed, but only abnormal results are displayed) Labs Reviewed  CBG MONITORING, ED - Abnormal; Notable for the following:       Result Value   Glucose-Capillary 279 (*)    All other components within normal limits    EKG  EKG Interpretation None       Radiology Dg Forearm Left  Result Date: 05/08/2016 CLINICAL DATA:  C/O Entire LEFT arm pain x 2 days. NKI. No swelling or bruising noted. Pt states her arm is "throbbing" Pt has chronic pain and sees a pain clinic. EXAM: LEFT FOREARM - 2 VIEW COMPARISON:  None. FINDINGS:  Mild degenerative findings in the wrist. Mild spurring in the elbow. Radius and ulna unremarkable. No gas in the soft tissues. No elbow joint effusion. IMPRESSION: 1. Mild degenerative findings is in the wrist and elbow. No acute abnormality observed. Electronically Signed   By: Van Clines M.D.   On: 05/08/2016 13:00   Dg Shoulder Left  Result Date: 05/08/2016 CLINICAL DATA:  C/O Entire LEFT arm pain x 2 days. NKI. No swelling or bruising noted. Pt states her arm is "throbbing" Pt has chronic pain and sees a pain clinic. EXAM: LEFT SHOULDER - 2+ VIEW COMPARISON:  10/05/2014 FINDINGS:  Inferior glenoid spurring. AC joint unremarkable. Subacromial morphology is type 2 (curved). IMPRESSION: 1. Mild degenerative glenohumeral spurring. Otherwise, no significant abnormalities are observed. Electronically Signed   By: Van Clines M.D.   On: 05/08/2016 12:58   Dg Humerus Left  Result Date: 05/08/2016 CLINICAL DATA:  C/O Entire LEFT arm pain x 2 days. NKI. No swelling or bruising noted. Pt states her arm is "throbbing" Pt has chronic pain and sees a pain clinic. EXAM: LEFT HUMERUS - 2+ VIEW COMPARISON:  None. FINDINGS: Spurring of the radial head, coronoid process, and distal humerus. The humerus appears otherwise unremarkable. IMPRESSION: 1. Mild spurring in the elbow, otherwise negative. Electronically Signed   By: Van Clines M.D.   On: 05/08/2016 12:59   Dg Hand Complete Right  Result Date: 05/08/2016 CLINICAL DATA:  Right middle finger pain for 2 weeks. No known injury. Initial encounter. EXAM: RIGHT HAND - COMPLETE 3+ VIEW COMPARISON:  None. FINDINGS: There is no evidence of fracture or dislocation. There is no evidence of arthropathy or other focal bone abnormality. Soft tissues are unremarkable. IMPRESSION: Negative right hand radiographs. Electronically Signed   By: San Morelle M.D.   On: 05/08/2016 12:57    Procedures Procedures (including critical care  time)  Medications Ordered in ED Medications  diazepam (VALIUM) injection 5 mg (5 mg Intramuscular Given 05/08/16 1228)  HYDROmorphone (DILAUDID) injection 1 mg (1 mg Intramuscular Given 05/08/16 1228)     Initial Impression / Assessment and Plan / ED Course  I have reviewed the triage vital signs and the nursing notes.  Pertinent labs & imaging results that were available during my care of the patient were reviewed by me and considered in my medical decision making (see chart for details).  Clinical Course    Guy Canet is a 56 y.o. female here with R hand pain, L upper arm pain. CBG 279, hx of DM. No signs of DKA. Denies chest pain and I doubt ACS. Hx of chronic pain and I think likely worsening chronic pain. Will get xrays to r/o acute fractures. She has pain medicine doctor who prescribes pain meds for her and I told her that I will not prescribe any pain meds.   1:17 PM xrays unremarkable. Pain improved. Will put finger splint and sling for comfort. Will dc home.    Final Clinical Impressions(s) / ED Diagnoses   Final diagnoses:  None    New Prescriptions New Prescriptions   No medications on file     Drenda Freeze, MD 05/08/16 1324

## 2016-05-08 NOTE — ED Triage Notes (Addendum)
Pt c/o left shoulder pain and generalizes chronic pain  X 2 days Pt  Sees a pain clinic

## 2016-05-08 NOTE — ED Notes (Signed)
Spoke with Marden Noble at [facility] for consult.

## 2016-05-08 NOTE — Discharge Instructions (Signed)
Take your pain meds as prescribed by your pain doctor.   See your orthopedic doctor for follow up   Return to ER if you have severe pain, numbness, weakness, chest pain

## 2016-06-20 ENCOUNTER — Emergency Department (HOSPITAL_BASED_OUTPATIENT_CLINIC_OR_DEPARTMENT_OTHER)
Admission: EM | Admit: 2016-06-20 | Discharge: 2016-06-20 | Disposition: A | Discharge status: Home / Self Care | Payer: Medicaid Other | Attending: Emergency Medicine | Admitting: Emergency Medicine

## 2016-06-20 ENCOUNTER — Encounter (HOSPITAL_BASED_OUTPATIENT_CLINIC_OR_DEPARTMENT_OTHER): Payer: Self-pay | Admitting: Emergency Medicine

## 2016-06-20 DIAGNOSIS — Z794 Long term (current) use of insulin: Secondary | ICD-10-CM | POA: Insufficient documentation

## 2016-06-20 DIAGNOSIS — I10 Essential (primary) hypertension: Secondary | ICD-10-CM | POA: Diagnosis not present

## 2016-06-20 DIAGNOSIS — R69 Illness, unspecified: Secondary | ICD-10-CM

## 2016-06-20 DIAGNOSIS — R05 Cough: Secondary | ICD-10-CM | POA: Insufficient documentation

## 2016-06-20 DIAGNOSIS — J029 Acute pharyngitis, unspecified: Secondary | ICD-10-CM | POA: Insufficient documentation

## 2016-06-20 DIAGNOSIS — J45909 Unspecified asthma, uncomplicated: Secondary | ICD-10-CM | POA: Diagnosis not present

## 2016-06-20 DIAGNOSIS — R51 Headache: Secondary | ICD-10-CM | POA: Insufficient documentation

## 2016-06-20 DIAGNOSIS — Z79899 Other long term (current) drug therapy: Secondary | ICD-10-CM | POA: Insufficient documentation

## 2016-06-20 DIAGNOSIS — Z7982 Long term (current) use of aspirin: Secondary | ICD-10-CM | POA: Insufficient documentation

## 2016-06-20 DIAGNOSIS — J111 Influenza due to unidentified influenza virus with other respiratory manifestations: Secondary | ICD-10-CM

## 2016-06-20 MED ORDER — GUAIFENESIN-CODEINE 100-10 MG/5ML PO SOLN: 10.0000 mL | Freq: Four times a day (QID) | ORAL | 0 refills | Status: DC | PRN

## 2016-06-20 MED ORDER — IBUPROFEN 800 MG PO TABS
800.0000 mg | ORAL_TABLET | Freq: Once | ORAL | Status: AC
  Administered 2016-06-20: 800 mg via ORAL
  Filled 2016-06-20: qty 1

## 2016-06-20 MED ORDER — GUAIFENESIN-CODEINE 100-10 MG/5ML PO SOLN
10.0000 mL | Freq: Once | ORAL | Status: AC
  Administered 2016-06-20: 10 mL via ORAL
  Filled 2016-06-20: qty 10

## 2016-06-20 NOTE — ED Notes (Addendum)
Pt reports cough and body aches for 4 days. Pt has been around son and grandson with the flu.

## 2016-06-20 NOTE — ED Provider Notes (Signed)
TIME SEEN: 3:35 AM  CHIEF COMPLAINT: Flulike symptoms  HPI: Patient is a 56 year old female with history of hypertension, diabetes, fibromyalgia who presents the emergency department flulike symptoms. Reports in the past 4 days she has had chills, cough, sore throat, diffuse throbbing headache and body aches. States "I feel bad". She has not taken a medications prior to arrival. States one of her sons was recently diagnosed with strep throat and the other one was diagnosed with influenza. She denies vomiting or diarrhea. No current chest pain or shortness of breath.  ROS: See HPI Constitutional: no fever  Eyes: no drainage  ENT: no runny nose   Cardiovascular:  no chest pain  Resp: no SOB  GI: no vomiting GU: no dysuria Integumentary: no rash  Allergy: no hives  Musculoskeletal: no leg swelling  Neurological: no slurred speech ROS otherwise negative  PAST MEDICAL HISTORY/PAST SURGICAL HISTORY:  Past Medical History:  Diagnosis Date  . Anxiety   . Asthma   . Depression   . Diabetes mellitus   . Fibromyalgia   . Heart attack   . Hypertension   . Neuropathy (HCC)     MEDICATIONS:  Prior to Admission medications   Medication Sig Start Date End Date Taking? Authorizing Provider  albuterol (PROVENTIL HFA;VENTOLIN HFA) 108 (90 BASE) MCG/ACT inhaler Inhale 2 puffs into the lungs every 6 (six) hours as needed. For shortness of breath and wheezing     Historical Provider, MD  aspirin 325 MG EC tablet Take 325 mg by mouth daily.      Historical Provider, MD  beclomethasone (QVAR) 80 MCG/ACT inhaler Inhale 2 puffs into the lungs 2 (two) times daily.    Historical Provider, MD  buPROPion (WELLBUTRIN) 75 MG tablet Take 75 mg by mouth daily.    Historical Provider, MD  clonazePAM (KLONOPIN) 0.5 MG tablet Take 0.5 mg by mouth 3 (three) times daily as needed for anxiety.    Historical Provider, MD  FLUoxetine (PROZAC) 40 MG capsule Take 40 mg by mouth daily.    Historical Provider, MD   Fluticasone-Salmeterol (ADVAIR) 500-50 MCG/DOSE AEPB Inhale 1 puff into the lungs every 12 (twelve) hours.      Historical Provider, MD  insulin aspart (NOVOLOG) 100 UNIT/ML injection Inject 10 Units into the skin 4 (four) times daily as needed for high blood sugar (sliding scale). 09/02/15   Fredia Sorrow, MD  Insulin Glargine (TOUJEO SOLOSTAR) 300 UNIT/ML SOPN Inject 90 Units into the skin 2 (two) times daily. 01/29/16   Recardo Evangelist, PA-C  mometasone (NASONEX) 50 MCG/ACT nasal spray Place 2 sprays into both nostrils daily.      Historical Provider, MD  omeprazole (PRILOSEC) 40 MG capsule Take 40 mg by mouth daily.    Historical Provider, MD  oxycodone (OXY-IR) 5 MG capsule Take 5 mg by mouth every 4 (four) hours as needed. For pain     Historical Provider, MD  traMADol (ULTRAM) 50 MG tablet Take 1 tablet (50 mg total) by mouth every 6 (six) hours as needed. 09/02/15   Fredia Sorrow, MD    ALLERGIES:  Allergies  Allergen Reactions  . Oxycodone-Aspirin     SOCIAL HISTORY:  Social History  Substance Use Topics  . Smoking status: Never Smoker  . Smokeless tobacco: Never Used  . Alcohol use No    FAMILY HISTORY: No family history on file.  EXAM: BP 160/96 (BP Location: Right Arm)   Pulse 111   Temp 98.8 F (37.1 C) (Oral)  Resp 16   Ht 5\' 7"  (1.702 m)   Wt 296 lb (134.3 kg)   SpO2 97%   BMI 46.36 kg/m  CONSTITUTIONAL: Alert and oriented and responds appropriately to questions. Well-appearing; well-nourished, Afebrile and nontoxic HEAD: Normocephalic EYES: Conjunctivae clear, PERRL, EOMI ENT: normal nose; no rhinorrhea; moist mucous membranes; No pharyngeal erythema or petechiae, no tonsillar hypertrophy or exudate, no uvular deviation, no unilateral swelling, no trismus or drooling, no muffled voice, normal phonation, no stridor, no dental caries present, no drainable dental abscess noted, no Ludwig's angina, tongue sits flat in the bottom of the mouth, no angioedema,  no facial erythema or warmth, no facial swelling; no pain with movement of the neck NECK: Supple, no meningismus, no nuchal rigidity, no LAD  CARD: RRR; S1 and S2 appreciated; no murmurs, no clicks, no rubs, no gallops RESP: Normal chest excursion without splinting or tachypnea; breath sounds clear and equal bilaterally; no wheezes, no rhonchi, no rales, no hypoxia or respiratory distress, speaking full sentences ABD/GI: Normal bowel sounds; non-distended; soft, non-tender, no rebound, no guarding, no peritoneal signs, no hepatosplenomegaly BACK:  The back appears normal and is non-tender to palpation, there is no CVA tenderness EXT: Normal ROM in all joints; non-tender to palpation; no edema; normal capillary refill; no cyanosis, no calf tenderness or swelling    SKIN: Normal color for age and race; warm; no rash NEURO: Moves all extremities equally, sensation to light touch intact diffusely, cranial nerves II through XII intact, normal speech PSYCH: The patient's mood and manner are appropriate. Grooming and personal hygiene are appropriate.  MEDICAL DECISION MAKING: Patient here with flulike symptoms for 4 days. She is outside treatment window for Tamiflu. Her lungs are clear and she is not febrile currently despite not taking any antipyretics. Very low suspicion for pneumonia. Doubt meningitis based on her exam. She appears well-hydrated. Have recommended over-the-counter medications for symptom control. I do not feel she needs antibiotics. No signs of pharyngitis on exam either, deep space neck infection or peritonsillar abscess. I feel she is safe to be discharged home. We'll give outpatient PCP information. Discussed at length return precautions with patient, supportive care instructions.  Given ibuprofen, guaifenesin with codeine here for symptom control.  At this time, I do not feel there is any life-threatening condition present. I have reviewed and discussed all results (EKG, imaging, lab,  urine as appropriate) and exam findings with patient/family. I have reviewed nursing notes and appropriate previous records.  I feel the patient is safe to be discharged home without further emergent workup and can continue workup as an outpatient as needed. Discussed usual and customary return precautions. Patient/family verbalize understanding and are comfortable with this plan.  Outpatient follow-up has been provided. All questions have been answered.      [place], DO 06/20/16 [phone]

## 2016-06-20 NOTE — ED Notes (Signed)
Out in w/c, "starting to feel better", denies questions or needs, calling son for ride, VSS, given Rx x1.

## 2016-06-20 NOTE — Discharge Instructions (Signed)
You may alternate Tylenol 1000 mg every 6 hours as needed for fever and pain and ibuprofen 800 mg every 8 hours as needed for fever and pain. Please rest and drink plenty of fluids. This is a viral illness causing your symptoms. You do not need antibiotics for a virus. You may use over-the-counter nasal saline spray and Afrin nasal saline spray as needed for nasal congestion. Please do not use Afrin for more than 3 days in a row. This may take 7-14 days to run its course. Please follow-up with her primary care physician if symptoms are not improving.   To find a primary care or specialty doctor please call [phone] or [phone] to access "[facility] Doctor [facility]."  You may also go on the [facility] website at [email or web address]  There are also multiple [facility], [facility], [facility] and [facility] practices throughout the [place] that are frequently accepting new patients. You may find a clinic that is close to your home and contact them.  Ashley [phone] [place]  [place] [address] [address] [place] [phone]

## 2016-06-20 NOTE — ED Triage Notes (Signed)
Pt presents to ED with body aches, cough, "feels bad', heachache and trouble breathing. NAD.

## 2016-08-30 ENCOUNTER — Emergency Department (HOSPITAL_BASED_OUTPATIENT_CLINIC_OR_DEPARTMENT_OTHER): Payer: Medicaid Other

## 2016-08-30 ENCOUNTER — Encounter (HOSPITAL_BASED_OUTPATIENT_CLINIC_OR_DEPARTMENT_OTHER): Payer: Self-pay

## 2016-08-30 ENCOUNTER — Emergency Department (HOSPITAL_BASED_OUTPATIENT_CLINIC_OR_DEPARTMENT_OTHER)
Admission: EM | Admit: 2016-08-30 | Discharge: 2016-08-30 | Disposition: A | Discharge status: Home / Self Care | Payer: Medicaid Other | Attending: Emergency Medicine | Admitting: Emergency Medicine

## 2016-08-30 DIAGNOSIS — Z794 Long term (current) use of insulin: Secondary | ICD-10-CM | POA: Diagnosis not present

## 2016-08-30 DIAGNOSIS — M549 Dorsalgia, unspecified: Secondary | ICD-10-CM | POA: Diagnosis not present

## 2016-08-30 DIAGNOSIS — E119 Type 2 diabetes mellitus without complications: Secondary | ICD-10-CM | POA: Diagnosis not present

## 2016-08-30 DIAGNOSIS — R0602 Shortness of breath: Secondary | ICD-10-CM | POA: Diagnosis not present

## 2016-08-30 DIAGNOSIS — Z7982 Long term (current) use of aspirin: Secondary | ICD-10-CM | POA: Diagnosis not present

## 2016-08-30 DIAGNOSIS — J45909 Unspecified asthma, uncomplicated: Secondary | ICD-10-CM | POA: Diagnosis not present

## 2016-08-30 DIAGNOSIS — R0789 Other chest pain: Secondary | ICD-10-CM

## 2016-08-30 DIAGNOSIS — M7989 Other specified soft tissue disorders: Secondary | ICD-10-CM | POA: Insufficient documentation

## 2016-08-30 DIAGNOSIS — I10 Essential (primary) hypertension: Secondary | ICD-10-CM | POA: Diagnosis not present

## 2016-08-30 LAB — BASIC METABOLIC PANEL WITH GFR
Anion gap: 8 (ref 5–15)
BUN: 14 mg/dL (ref 6–20)
CO2: 27 mmol/L (ref 22–32)
Calcium: 8.4 mg/dL — ABNORMAL LOW (ref 8.9–10.3)
Chloride: 100 mmol/L — ABNORMAL LOW (ref 101–111)
Creatinine, Ser: 0.81 mg/dL (ref 0.44–1.00)
GFR calc Af Amer: >60 mL/min (ref >60)
GFR calc non Af Amer: >60 mL/min (ref >60)
Glucose, Bld: 275 mg/dL — ABNORMAL HIGH (ref 65–99)
Potassium: 3.6 mmol/L (ref 3.5–5.1)
Sodium: 135 mmol/L (ref 135–145)

## 2016-08-30 LAB — CBC
HEMATOCRIT: 35.7 % — AB (ref 36.0–46.0)
Hemoglobin: 12.3 g/dL (ref 12.0–15.0)
MCH: 27.7 pg (ref 26.0–34.0)
MCHC: 34.5 g/dL (ref 30.0–36.0)
MCV: 80.4 fL (ref 78.0–100.0)
Platelets: 219 10*3/uL (ref 150–400)
RBC: 4.44 MIL/uL (ref 3.87–5.11)
RDW: 12.6 % (ref 11.5–15.5)
WBC: 7.1 10*3/uL (ref 4.0–10.5)

## 2016-08-30 LAB — D-DIMER, QUANTITATIVE (NOT AT ARMC): D DIMER QUANT: 0.29 ug{FEU}/mL (ref 0.00–0.50)

## 2016-08-30 LAB — TROPONIN I: Troponin I: <0.03 ng/mL (ref <0.03)

## 2016-08-30 MED ORDER — CYCLOBENZAPRINE HCL 10 MG PO TABS: 10.0000 mg | ORAL_TABLET | Freq: Two times a day (BID) | ORAL | 0 refills | Status: AC | PRN

## 2016-08-30 MED ORDER — ASPIRIN 81 MG PO CHEW
324.0000 mg | CHEWABLE_TABLET | Freq: Once | ORAL | Status: AC
  Administered 2016-08-30: 324 mg via ORAL
  Filled 2016-08-30: qty 4

## 2016-08-30 MED ORDER — NITROGLYCERIN 0.4 MG SL SUBL
0.4000 mg | SUBLINGUAL_TABLET | SUBLINGUAL | Status: AC | PRN
  Administered 2016-08-30 (×3): 0.4 mg via SUBLINGUAL
  Filled 2016-08-30: qty 1

## 2016-08-30 MED ORDER — FAMOTIDINE IN NACL 20-0.9 MG/50ML-% IV SOLN
20.0000 mg | Freq: Once | INTRAVENOUS | Status: AC
  Administered 2016-08-30: 20 mg via INTRAVENOUS
  Filled 2016-08-30: qty 50

## 2016-08-30 MED ORDER — ACETAMINOPHEN 500 MG PO TABS
1000.0000 mg | ORAL_TABLET | Freq: Once | ORAL | Status: AC
  Administered 2016-08-30: 1000 mg via ORAL
  Filled 2016-08-30: qty 2

## 2016-08-30 NOTE — ED Provider Notes (Signed)
[place] DEPT [facility] Provider Note   CSN: [id number] Arrival date & time: 08/30/16  0520     History   Chief Complaint Chief Complaint  Patient presents with  . Chest Pain    HPI Judy English is a 56 y.o. female.  HPI  56 year old female presents with a chief complaint of chest pain. Started around 8 PM last night. Is in her middle chest and just right of midline. Radiates to her back. Feels like a pressure sensation. There is also shortness of breath. Her right arm has been sore as well. Breathing makes the pain worse. She denies any cough. Took Bayer aspirin without relief. She has a history of hypertension and diabetes. She does not smoke. She has had a history of hypercholesterolemia but is not sure if she still has it. She states she had a heart attack 8 years ago but no interventions were performed and she's not 100% sure she really had this or not. No vomiting. Chronically has bilateral pedal edema, not worse than typical. No unilateral swelling. When sitting up during the exam she notes that this increases her pain. She keeps feeling like she needs to burp throughout the night.  Past Medical History:  Diagnosis Date  . Anxiety   . Asthma   . Depression   . Diabetes mellitus   . Fibromyalgia   . Heart attack ([place])   . Hypertension   . Neuropathy     Patient Active Problem List   Diagnosis Date Noted  . DIABETES MELLITUS, TYPE II, WITHOUT COMPLICATIONS 99/37/1696  . MORBID OBESITY 04/28/2008  . DEPRESSION 04/28/2008  . HYPERTENSION, ESSENTIAL 04/28/2008  . ASTHMA, UNSPECIFIED, UNSPECIFIED STATUS 04/28/2008  . GERD 04/28/2008  . DEGENERATIVE JOINT DISEASE, BOTH KNEES, SEVERE 04/28/2008  . SPONDYLOSIS, CERVICAL, WITHOUT MYELOPATHY 04/28/2008  . MYOFASCIAL PAIN SYNDROME 04/28/2008  . OBSTRUCTIVE SLEEP APNEA 05/02/2007    Past Surgical History:  Procedure Laterality Date  . CESAREAN SECTION    . UTERINE FIBROID SURGERY      OB History    No data  available       Home Medications    Prior to Admission medications   Medication Sig Start Date End Date Taking? Authorizing Provider  albuterol (PROVENTIL HFA;VENTOLIN HFA) 108 (90 BASE) MCG/ACT inhaler Inhale 2 puffs into the lungs every 6 (six) hours as needed. For shortness of breath and wheezing     [provider]  aspirin 325 MG EC tablet Take 325 mg by mouth daily.      [provider]  beclomethasone (QVAR) 80 MCG/ACT inhaler Inhale 2 puffs into the lungs 2 (two) times daily.    [provider]  buPROPion (WELLBUTRIN) 75 MG tablet Take 75 mg by mouth daily.    [provider]  clonazePAM (KLONOPIN) 0.5 MG tablet Take 0.5 mg by mouth 3 (three) times daily as needed for anxiety.    [provider]  FLUoxetine (PROZAC) 40 MG capsule Take 40 mg by mouth daily.    [provider]  Fluticasone-Salmeterol (ADVAIR) 500-50 MCG/DOSE AEPB Inhale 1 puff into the lungs every 12 (twelve) hours.      [provider]  guaiFENesin-codeine 100-10 MG/5ML syrup Take 10 mLs by mouth every 6 (six) hours as needed for cough. 06/20/16   Ward, Delice Bison, DO  insulin aspart (NOVOLOG) 100 UNIT/ML injection Inject 10 Units into the skin 4 (four) times daily as needed for high blood sugar (sliding scale). 09/02/15   Fredia Sorrow, MD  Insulin Glargine (TOUJEO SOLOSTAR) 300 UNIT/ML SOPN Inject 90 Units into the skin 2 (two) times daily. 01/29/16   Recardo Evangelist, PA-C  mometasone (NASONEX) 50 MCG/ACT nasal spray Place 2 sprays into both nostrils daily.      [provider]  omeprazole (PRILOSEC) 40 MG capsule Take 40 mg by mouth daily.    [provider]  oxycodone (OXY-IR) 5 MG capsule Take 5 mg by mouth every 4 (four) hours as needed. For pain     [provider]  traMADol (ULTRAM) 50 MG tablet Take 1 tablet (50 mg total) by mouth every 6 (six) hours as needed. 09/02/15   Fredia Sorrow, MD    Family History No  family history on file.  Social History Social History  Substance Use Topics  . Smoking status: Never Smoker  . Smokeless tobacco: Never Used  . Alcohol use No     Allergies   Patient has no known allergies.   Review of Systems Review of Systems  Respiratory: Positive for shortness of breath. Negative for cough.   Cardiovascular: Positive for chest pain and leg swelling (feet, chronic).  Gastrointestinal: Negative for abdominal pain and vomiting.  Musculoskeletal: Positive for back pain.  All other systems reviewed and are negative.    Physical Exam Updated Vital Signs BP (!) 168/89   Pulse 97   Temp 98.3 F (36.8 C) (Oral)   Resp 19   Ht 5\' 6"  (1.676 m)   Wt 300 lb (136.1 kg)   SpO2 95%   BMI 48.42 kg/m   Physical Exam  Constitutional: She is oriented to person, place, and time. She appears well-developed and well-nourished.  Morbidly obese  HENT:  Head: Normocephalic and atraumatic.  Right Ear: External ear normal.  Left Ear: External ear normal.  Nose: Nose normal.  Eyes: Right eye exhibits no discharge. Left eye exhibits no discharge.  Cardiovascular: Normal rate, regular rhythm and normal heart sounds.   Pulses:      Radial pulses are 2+ on the right side, and 2+ on the left side.  Pulmonary/Chest: Effort normal and breath sounds normal. She exhibits tenderness.    Abdominal: Soft. There is no tenderness.  Musculoskeletal: She exhibits no edema.  Mid upper back tenderness to palpation  Neurological: She is alert and oriented to person, place, and time.  Skin: Skin is warm and dry. She is not diaphoretic.  Nursing note and vitals reviewed.    ED Treatments / Results  Labs (all labs ordered are listed, but only abnormal results are displayed) Labs Reviewed  BASIC METABOLIC PANEL - Abnormal; Notable for the following:       Result Value   Chloride 100 (*)    Glucose, Bld 275 (*)    Calcium 8.4 (*)    All other components within normal limits    CBC - Abnormal; Notable for the following:    HCT 35.7 (*)    All other components within normal limits  TROPONIN I  D-DIMER, QUANTITATIVE (NOT AT [facility])  TROPONIN I    EKG  EKG Interpretation  Date/Time:  Tuesday Aug 30 2016 05:31:31 EDT Ventricular Rate:  87 PR Interval:    QRS Duration: 94 QT Interval:  383 QTC Calculation: 461 R Axis:   -35 Text Interpretation:  Sinus rhythm Left axis deviation Borderline T wave abnormalities No significant change since May 2017 Confirmed by Sherwood Gambler [phone]) on 08/30/2016 5:34:43 AM       Radiology Dg Chest 2 View  Result Date: 08/30/2016 CLINICAL DATA:  Mid chest pain radiating to the back for 2 days. Fell 3 weeks ago. EXAM: CHEST  2 VIEW COMPARISON:  04/04/2016 FINDINGS: The heart size and mediastinal contours are within normal limits. Both lungs are clear. The visualized skeletal structures are unremarkable. IMPRESSION: No active cardiopulmonary disease. Electronically Signed   By: Lucienne Capers M.D.   On: 08/30/2016 06:38    Procedures Procedures (including critical care time)  Medications Ordered in ED Medications  famotidine (PEPCID) IVPB 20 mg premix (20 mg Intravenous New Bag/Given 08/30/16 0700)  aspirin chewable tablet 324 mg (324 mg Oral Given 08/30/16 0600)  nitroGLYCERIN (NITROSTAT) SL tablet 0.4 mg (0.4 mg Sublingual Given 08/30/16 0610)  acetaminophen (TYLENOL) tablet 1,000 mg (1,000 mg Oral Given 08/30/16 0600)     Initial Impression / Assessment and Plan / ED Course  I have reviewed the triage vital signs and the nursing notes.  Pertinent labs & imaging results that were available during my care of the patient were reviewed by me and considered in my medical decision making (see chart for details).     Patient's workup is unremarkable at this time. She is overall well appearing. She currently feels better. There are components of chest wall etiology such as reproducible pain and pain with movement as well as a GI  component with some burping. ECG is unremarkable. Initial troponin negative. Given comorbidities, plan for second troponin. I discussed options with patient as I think this is unlikely to be ACS however she has risk factors. She does not want to be admitted to the hospital at this time and would rather do a second troponin and be discharged home with outpatient follow-up with her PCP. I think this is reasonable. I think PE and dissection or less likely. Plan for discharge home with return precautions assuming second troponin and repeat ECG are unremarkable. Care to Dr. Darl Householder with these results pending.  Final Clinical Impressions(s) / ED Diagnoses   Final diagnoses:  Atypical chest pain    New Prescriptions New Prescriptions   No medications on file     Sherwood Gambler, MD 08/30/16 [phone]

## 2016-08-30 NOTE — ED Notes (Signed)
Patient transported to X-ray 

## 2016-08-30 NOTE — ED Provider Notes (Signed)
  Physical Exam  BP (!) 181/108   Pulse 81   Temp 98.3 F (36.8 C) (Oral)   Resp 12   Ht 5\' 6"  (1.676 m)   Wt 300 lb (136.1 kg)   SpO2 98%   BMI 48.42 kg/m   Physical Exam  ED Course  Procedures  MDM Care assumed at 7 am from Dr. Regenia Skeeter. Patient has chronic pain and is on oxycodone and has acute onset of chest pain last night. Pain is reproducible with exam. Sign out pending delta trop, repeat EKG.   10:08 AM Delta trop neg. D-dimer nl. Repeat EKG unremarkable. Pain reproducible on exam. She has oxycodone at home. Not on muscle relaxants so will give flexeril. Hypertensive in the ED but has hx of HTN and didn't take her BP meds this morning. Will have her take her BP meds when she gets home, I doubt dissection.      Drenda Freeze, MD 08/30/16 1009

## 2016-08-30 NOTE — Discharge Instructions (Signed)
Take your blood pressure medicines when you get home.   Continue oxycodone as prescribed by your doctor.   Take flexeril for muscle strain.   No heavy lifting for 2 days.   See your doctor in a week, recheck blood pressure with your doctor  Return to ER if you have worse chest pain, trouble breathing, vomiting, weakness, numbness.

## 2016-08-30 NOTE — ED Triage Notes (Signed)
Pt c/o mid chest pain that radiates to her center back that started earlier Monday, had to wait for her son to come from where he is to get her and bring her here.  The pain has been unrelieved by her chronic pain meds.

## 2016-09-08 ENCOUNTER — Encounter (HOSPITAL_BASED_OUTPATIENT_CLINIC_OR_DEPARTMENT_OTHER): Payer: Self-pay | Admitting: Emergency Medicine

## 2016-09-08 ENCOUNTER — Emergency Department (HOSPITAL_BASED_OUTPATIENT_CLINIC_OR_DEPARTMENT_OTHER)
Admission: EM | Admit: 2016-09-08 | Discharge: 2016-09-08 | Disposition: A | Discharge status: Other Acute Inpatient Hospital | Payer: Medicaid Other | Attending: Emergency Medicine | Admitting: Emergency Medicine

## 2016-09-08 ENCOUNTER — Emergency Department (HOSPITAL_BASED_OUTPATIENT_CLINIC_OR_DEPARTMENT_OTHER): Payer: Medicaid Other

## 2016-09-08 DIAGNOSIS — Z7902 Long term (current) use of antithrombotics/antiplatelets: Secondary | ICD-10-CM | POA: Insufficient documentation

## 2016-09-08 DIAGNOSIS — E1165 Type 2 diabetes mellitus with hyperglycemia: Secondary | ICD-10-CM | POA: Insufficient documentation

## 2016-09-08 DIAGNOSIS — I10 Essential (primary) hypertension: Secondary | ICD-10-CM | POA: Insufficient documentation

## 2016-09-08 DIAGNOSIS — E1140 Type 2 diabetes mellitus with diabetic neuropathy, unspecified: Secondary | ICD-10-CM | POA: Insufficient documentation

## 2016-09-08 DIAGNOSIS — J45909 Unspecified asthma, uncomplicated: Secondary | ICD-10-CM | POA: Diagnosis not present

## 2016-09-08 DIAGNOSIS — E876 Hypokalemia: Secondary | ICD-10-CM | POA: Diagnosis not present

## 2016-09-08 DIAGNOSIS — Z79899 Other long term (current) drug therapy: Secondary | ICD-10-CM | POA: Diagnosis not present

## 2016-09-08 DIAGNOSIS — Z7982 Long term (current) use of aspirin: Secondary | ICD-10-CM | POA: Insufficient documentation

## 2016-09-08 DIAGNOSIS — Z794 Long term (current) use of insulin: Secondary | ICD-10-CM | POA: Insufficient documentation

## 2016-09-08 DIAGNOSIS — R079 Chest pain, unspecified: Secondary | ICD-10-CM | POA: Insufficient documentation

## 2016-09-08 HISTORY — DX: Morbid (severe) obesity due to excess calories: E66.01

## 2016-09-08 LAB — BASIC METABOLIC PANEL
Anion gap: 10 (ref 5–15)
BUN: 18 mg/dL (ref 6–20)
CO2: 23 mmol/L (ref 22–32)
CREATININE: 0.98 mg/dL (ref 0.44–1.00)
Calcium: 8.8 mg/dL — ABNORMAL LOW (ref 8.9–10.3)
Chloride: 101 mmol/L (ref 101–111)
GFR calc non Af Amer: >60 mL/min (ref >60)
Glucose, Bld: 323 mg/dL — ABNORMAL HIGH (ref 65–99)
Potassium: 3.1 mmol/L — ABNORMAL LOW (ref 3.5–5.1)
SODIUM: 134 mmol/L — AB (ref 135–145)

## 2016-09-08 LAB — CBC WITH DIFFERENTIAL/PLATELET
BASOS PCT: 0 %
Basophils Absolute: 0 10*3/uL (ref 0.0–0.1)
EOS ABS: 0.1 10*3/uL (ref 0.0–0.7)
EOS PCT: 1 %
HCT: 38.3 % (ref 36.0–46.0)
HEMOGLOBIN: 13.6 g/dL (ref 12.0–15.0)
Lymphocytes Relative: 30 %
Lymphs Abs: 2 10*3/uL (ref 0.7–4.0)
MCH: 28.3 pg (ref 26.0–34.0)
MCHC: 35.5 g/dL (ref 30.0–36.0)
MCV: 79.8 fL (ref 78.0–100.0)
Monocytes Absolute: 0.5 10*3/uL (ref 0.1–1.0)
Monocytes Relative: 7 %
NEUTROS PCT: 61 %
Neutro Abs: 4 10*3/uL (ref 1.7–7.7)
PLATELETS: 235 10*3/uL (ref 150–400)
RBC: 4.8 MIL/uL (ref 3.87–5.11)
RDW: 12.8 % (ref 11.5–15.5)
WBC: 6.5 10*3/uL (ref 4.0–10.5)

## 2016-09-08 LAB — D-DIMER, QUANTITATIVE (NOT AT ARMC)

## 2016-09-08 LAB — TROPONIN I

## 2016-09-08 MED ORDER — ACETAMINOPHEN 500 MG PO TABS
1000.0000 mg | ORAL_TABLET | Freq: Once | ORAL | Status: AC
  Administered 2016-09-08: 1000 mg via ORAL
  Filled 2016-09-08: qty 2

## 2016-09-08 MED ORDER — SODIUM CHLORIDE 0.9 % IV BOLUS (SEPSIS)
500.0000 mL | Freq: Once | INTRAVENOUS | Status: AC
Start: 2016-09-08 — End: 2016-09-08
  Administered 2016-09-08: 500 mL via INTRAVENOUS

## 2016-09-08 MED ORDER — INSULIN ASPART 100 UNIT/ML ~~LOC~~ SOLN
5.0000 [IU] | Freq: Once | SUBCUTANEOUS | Status: AC
  Administered 2016-09-08: 5 [IU] via SUBCUTANEOUS
  Filled 2016-09-08: qty 1

## 2016-09-08 MED ORDER — POTASSIUM CHLORIDE CRYS ER 20 MEQ PO TBCR
40.0000 meq | EXTENDED_RELEASE_TABLET | Freq: Once | ORAL | Status: AC
  Administered 2016-09-08: 40 meq via ORAL
  Filled 2016-09-08: qty 2

## 2016-09-08 MED ORDER — NITROGLYCERIN 0.4 MG SL SUBL
0.4000 mg | SUBLINGUAL_TABLET | SUBLINGUAL | Status: DC | PRN
  Administered 2016-09-08: 0.4 mg via SUBLINGUAL
  Filled 2016-09-08: qty 1

## 2016-09-08 MED ORDER — ASPIRIN 81 MG PO CHEW
324.0000 mg | CHEWABLE_TABLET | Freq: Once | ORAL | Status: AC
  Administered 2016-09-08: 324 mg via ORAL
  Filled 2016-09-08: qty 4

## 2016-09-08 NOTE — ED Notes (Signed)
Dr. Nicolette Bang is receiving patient to room 703 at [facility], Dr. Lenna Sciara sending

## 2016-09-08 NOTE — ED Notes (Signed)
Dr Lenna Sciara in room with pt and family now.

## 2016-09-08 NOTE — ED Notes (Signed)
Call Hospitalist at [facility] for Dr. Sharol Roussel return to

## 2016-09-08 NOTE — ED Notes (Signed)
Report given to Catherine, RN.

## 2016-09-08 NOTE — ED Triage Notes (Signed)
Pt c/o midsternal chest pain with shob that started this morning.

## 2016-09-08 NOTE — ED Provider Notes (Addendum)
[place] DEPT [facility] Provider Note   CSN: [id number] Arrival date & time: 09/08/16  7902     History   Chief Complaint Chief Complaint  Patient presents with  . Chest Pain    HPI Lashala Laser is a 56 y.o. female.Complains of anterior chest pain described as pressure onset approximate 5 AM today Accompanying symptoms include shortness of breath. Onset at rest. No other associated symptoms. Nothing makes symptoms better or worse. Feels like "heart pain" she's had in the past  HPI  Past Medical History:  Diagnosis Date  . Anxiety   . Asthma   . Depression   . Diabetes mellitus   . Fibromyalgia   . Heart attack ([place])   . Hypertension   . Morbid obesity ([place])   . Neuropathy     Patient Active Problem List   Diagnosis Date Noted  . DIABETES MELLITUS, TYPE II, WITHOUT COMPLICATIONS 40/97/3532  . MORBID OBESITY 04/28/2008  . DEPRESSION 04/28/2008  . HYPERTENSION, ESSENTIAL 04/28/2008  . ASTHMA, UNSPECIFIED, UNSPECIFIED STATUS 04/28/2008  . GERD 04/28/2008  . DEGENERATIVE JOINT DISEASE, BOTH KNEES, SEVERE 04/28/2008  . SPONDYLOSIS, CERVICAL, WITHOUT MYELOPATHY 04/28/2008  . MYOFASCIAL PAIN SYNDROME 04/28/2008  . OBSTRUCTIVE SLEEP APNEA 05/02/2007    Past Surgical History:  Procedure Laterality Date  . CESAREAN SECTION    . UTERINE FIBROID SURGERY      OB History    No data available       Home Medications    Prior to Admission medications   Medication Sig Start Date End Date Taking? Authorizing Provider  albuterol (PROVENTIL HFA;VENTOLIN HFA) 108 (90 BASE) MCG/ACT inhaler Inhale 2 puffs into the lungs every 6 (six) hours as needed. For shortness of breath and wheezing     [provider]  aspirin 325 MG EC tablet Take 325 mg by mouth daily.      [provider]  beclomethasone (QVAR) 80 MCG/ACT inhaler Inhale 2 puffs into the lungs 2 (two) times daily.    [provider]  buPROPion (WELLBUTRIN) 75 MG tablet Take 75 mg by  mouth daily.    [provider]  clonazePAM (KLONOPIN) 0.5 MG tablet Take 0.5 mg by mouth 3 (three) times daily as needed for anxiety.    [provider]  cyclobenzaprine (FLEXERIL) 10 MG tablet Take 1 tablet (10 mg total) by mouth 2 (two) times daily as needed for muscle spasms. 08/30/16   Drenda Freeze, MD  FLUoxetine (PROZAC) 40 MG capsule Take 40 mg by mouth daily.    [provider]  Fluticasone-Salmeterol (ADVAIR) 500-50 MCG/DOSE AEPB Inhale 1 puff into the lungs every 12 (twelve) hours.      [provider]  guaiFENesin-codeine 100-10 MG/5ML syrup Take 10 mLs by mouth every 6 (six) hours as needed for cough. 06/20/16   Ward, Delice Bison, DO  insulin aspart (NOVOLOG) 100 UNIT/ML injection Inject 10 Units into the skin 4 (four) times daily as needed for high blood sugar (sliding scale). 09/02/15   Fredia Sorrow, MD  Insulin Glargine (TOUJEO SOLOSTAR) 300 UNIT/ML SOPN Inject 90 Units into the skin 2 (two) times daily. 01/29/16   Recardo Evangelist, PA-C  mometasone (NASONEX) 50 MCG/ACT nasal spray Place 2 sprays into both nostrils daily.      [provider]  omeprazole (PRILOSEC) 40 MG capsule Take 40 mg by mouth daily.    [provider]  oxycodone (OXY-IR) 5 MG capsule Take 5 mg by mouth every 4 (four) hours as  needed. For pain     [provider]  traMADol (ULTRAM) 50 MG tablet Take 1 tablet (50 mg total) by mouth every 6 (six) hours as needed. 09/02/15   Fredia Sorrow, MD    Family History No family history on file.  Social History Social History  Substance Use Topics  . Smoking status: Never Smoker  . Smokeless tobacco: Never Used  . Alcohol use No     Allergies   Patient has no known allergies.   Review of Systems Review of Systems  Constitutional: Negative.   HENT: Negative.   Respiratory: Positive for shortness of breath.   Cardiovascular: Positive for chest pain.  Gastrointestinal: Negative.     Musculoskeletal: Negative.   Skin: Negative.   Allergic/Immunologic: Positive for immunocompromised state.       Diabetic  Neurological: Negative.   Psychiatric/Behavioral: Negative.   All other systems reviewed and are negative.    Physical Exam Updated Vital Signs BP (!) 159/120 (BP Location: Left Arm)   Pulse (!) 102   Temp 97.7 F (36.5 C) (Oral)   Ht 5\' 7"  (1.702 m)   Wt 285 lb (129.3 kg)   SpO2 98%   BMI 44.64 kg/m   Physical Exam  Constitutional: She appears well-developed and well-nourished. She appears distressed.  Anxious appearing  HENT:  Head: Normocephalic and atraumatic.  Eyes: Conjunctivae are normal. Pupils are equal, round, and reactive to light.  Neck: Neck supple. No tracheal deviation present. No thyromegaly present.  Cardiovascular: Normal rate and regular rhythm.   No murmur heard. Pulmonary/Chest: Effort normal and breath sounds normal.  Abdominal: Soft. Bowel sounds are normal. She exhibits no distension. There is no tenderness.  Morbidly obese  Musculoskeletal: Normal range of motion. She exhibits no edema or tenderness.  Neurological: She is alert. Coordination normal.  Skin: Skin is warm and dry. No rash noted.  Psychiatric: She has a normal mood and affect.  Nursing note and vitals reviewed.    ED Treatments / Results  Labs (all labs ordered are listed, but only abnormal results are displayed) Labs Reviewed  BASIC METABOLIC PANEL  CBC WITH DIFFERENTIAL/PLATELET  TROPONIN I  D-DIMER, QUANTITATIVE (NOT AT [facility])    EKG  EKG Interpretation None     ED ECG REPORT   Date: 09/08/2016  Rate: 100  Rhythm: sinus tachycardia  QRS Axis: left  Intervals: normal  ST/T Wave abnormalities: nonspecific T wave changes  Conduction Disutrbances:none  Narrative Interpretation: Poor R-wave progression   Old EKG Reviewed: unchanged Unchanged from 08/30/2016. Tracing from earlier today of poor quality I have personally reviewed the EKG  tracing and agree with the computerized printout as noted.  Radiology No results found. Chest x-ray viewed by me Procedures Procedures (including critical care time)  Medications Ordered in ED Medications - No data to display  Chest x-ray viewed by me Results for orders placed or performed during the hospital encounter of 62/94/76  Basic metabolic panel  Result Value Ref Range   Sodium 134 (L) 135 - 145 mmol/L   Potassium 3.1 (L) 3.5 - 5.1 mmol/L   Chloride 101 101 - 111 mmol/L   CO2 23 22 - 32 mmol/L   Glucose, Bld 323 (H) 65 - 99 mg/dL   BUN 18 6 - 20 mg/dL   Creatinine, Ser 0.98 0.44 - 1.00 mg/dL   Calcium 8.8 (L) 8.9 - 10.3 mg/dL   GFR calc non Af Amer >60 >60 mL/min   GFR calc Af Amer >60 >60  mL/min   Anion gap 10 5 - 15  CBC with Differential/Platelet  Result Value Ref Range   WBC 6.5 4.0 - 10.5 K/uL   RBC 4.80 3.87 - 5.11 MIL/uL   Hemoglobin 13.6 12.0 - 15.0 g/dL   HCT 38.3 36.0 - 46.0 %   MCV 79.8 78.0 - 100.0 fL   MCH 28.3 26.0 - 34.0 pg   MCHC 35.5 30.0 - 36.0 g/dL   RDW 12.8 11.5 - 15.5 %   Platelets 235 150 - 400 K/uL   Neutrophils Relative % 61 %   Neutro Abs 4.0 1.7 - 7.7 K/uL   Lymphocytes Relative 30 %   Lymphs Abs 2.0 0.7 - 4.0 K/uL   Monocytes Relative 7 %   Monocytes Absolute 0.5 0.1 - 1.0 K/uL   Eosinophils Relative 1 %   Eosinophils Absolute 0.1 0.0 - 0.7 K/uL   Basophils Relative 0 %   Basophils Absolute 0.0 0.0 - 0.1 K/uL  Troponin I  Result Value Ref Range   Troponin I <0.03 <0.03 ng/mL  D-dimer, quantitative (not at [facility])  Result Value Ref Range   D-Dimer, Quant <0.27 0.00 - 0.50 ug/mL-FEU   Dg Chest 2 View  Result Date: 08/30/2016 CLINICAL DATA:  Mid chest pain radiating to the back for 2 days. Fell 3 weeks ago. EXAM: CHEST  2 VIEW COMPARISON:  04/04/2016 FINDINGS: The heart size and mediastinal contours are within normal limits. Both lungs are clear. The visualized skeletal structures are unremarkable. IMPRESSION: No active  cardiopulmonary disease. Electronically Signed   By: Lucienne Capers M.D.   On: 08/30/2016 06:38   Dg Chest Port 1 View  Result Date: 09/08/2016 CLINICAL DATA:  Chest pain.  Shortness of breath . EXAM: PORTABLE CHEST 1 VIEW COMPARISON:  08/30/2016 . FINDINGS: Mediastinum and hilar structures normal. Lungs are clear. Heart size normal. No acute bony abnormality identified. IMPRESSION: No acute cardiopulmonary disease . Electronically Signed   By: Marcello Moores  Register   On: 09/08/2016 07:51   Initial Impression / Assessment and Plan / ED Course  I have reviewed the triage vital signs and the nursing notes.  Pertinent labs & imaging results that were available during my care of the patient were reviewed by me and considered in my medical decision making (see chart for details).     9:15 AM patient pain-free after treatment with one sublingual nitroglycerin and aspirin and intravenous saline bolus. She appears comfortable and no longer anxious appearing. Chest x-ray viewed by me. Oral potassium supplement and subcutaneous insulin ordered by me. I consulted Dr.Spongberg from [facility] who accepts patient in transfer. Low pretest clinical suspicion for pulmonary embolism. Negative d-dimer Final Clinical Impressions(s) / ED Diagnoses  Diagnosis #1 chest pain at rest #2 hyperglycemia #3 hypokalemia Final diagnoses:  None    New Prescriptions New Prescriptions   No medications on file     Orlie Dakin, MD 09/08/16 2957    Orlie Dakin, MD 09/08/16 1651

## 2016-12-20 ENCOUNTER — Other Ambulatory Visit (HOSPITAL_BASED_OUTPATIENT_CLINIC_OR_DEPARTMENT_OTHER): Payer: Self-pay | Admitting: Internal Medicine

## 2016-12-20 DIAGNOSIS — Z1231 Encounter for screening mammogram for malignant neoplasm of breast: Secondary | ICD-10-CM

## 2016-12-29 ENCOUNTER — Ambulatory Visit (HOSPITAL_BASED_OUTPATIENT_CLINIC_OR_DEPARTMENT_OTHER): Payer: Medicaid Other

## 2017-05-08 IMAGING — CT CT ABD-PELV W/ CM
2 of 5 series · 17 of 46 positions shown, 19 images · IV contrast (omnipaque)
Comparison: Lumbar MRI dated [DATE] and CT dated 04/21/2013

ADDENDUM:
Please note in the overall appearance and size of the uterus is
grossly similar to the study dated 04/21/2013
CLINICAL DATA: 54-year-old female with abdominal pain and nausea
vomiting

EXAM:
CT ABDOMEN AND PELVIS WITH CONTRAST
TECHNIQUE: Multidetector CT imaging of the abdomen and pelvis was performed
using the standard protocol following bolus administration of
intravenous contrast.
CONTRAST:  100mL OMNIPAQUE IOHEXOL 300 MG/ML  SOLN

[Series 2: abd/pelvis 5.0 b31f · axial · 0.98mm/px · z∈[-528,-98]mm · 14 of 98 slices shown, 16 images]
[im 6/98  soft-tissue]
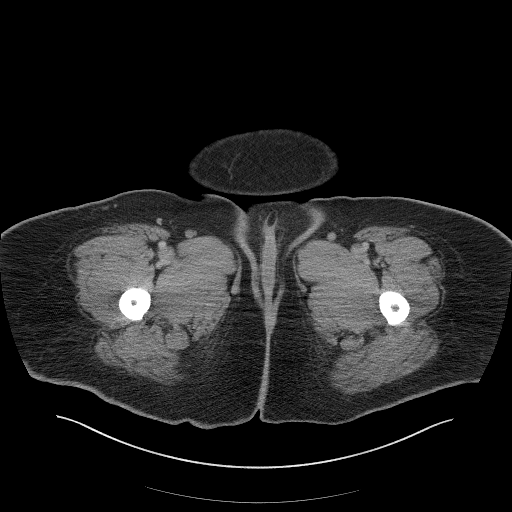
[im 6/98  bone]
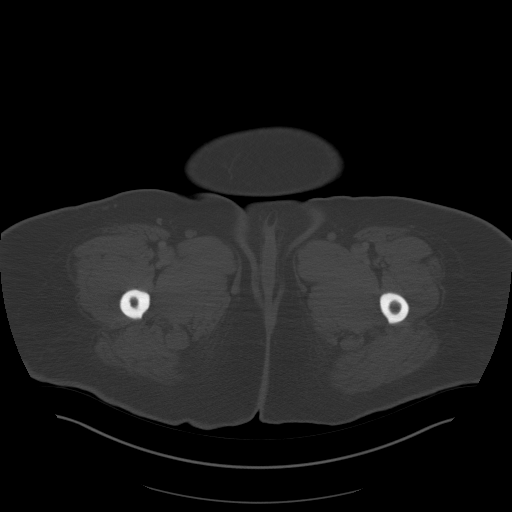
[im 11/98  soft-tissue]
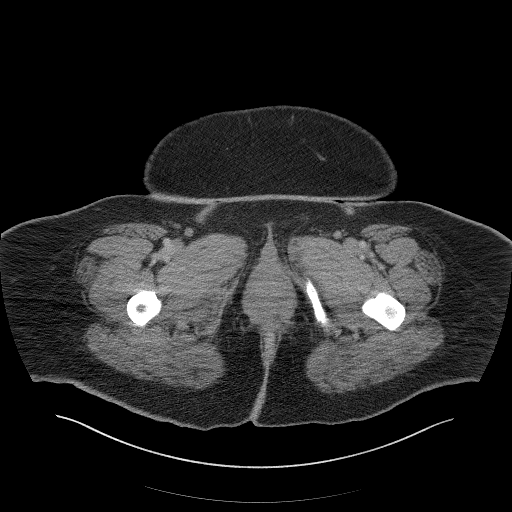
[im 21/98  soft-tissue]
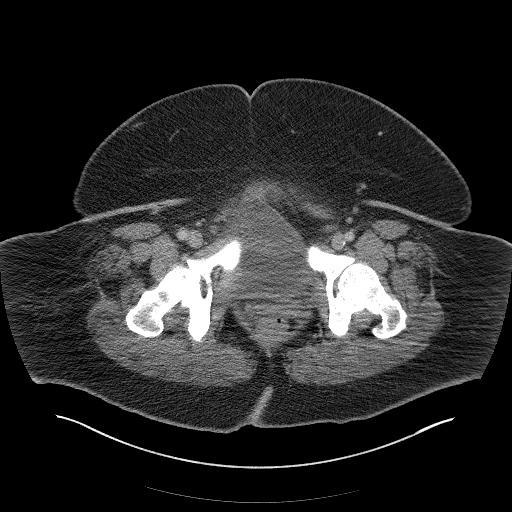
[im 26/98  soft-tissue]
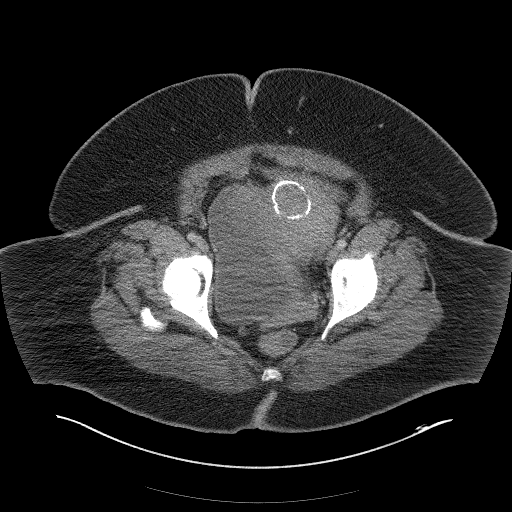
[im 31/98  soft-tissue]
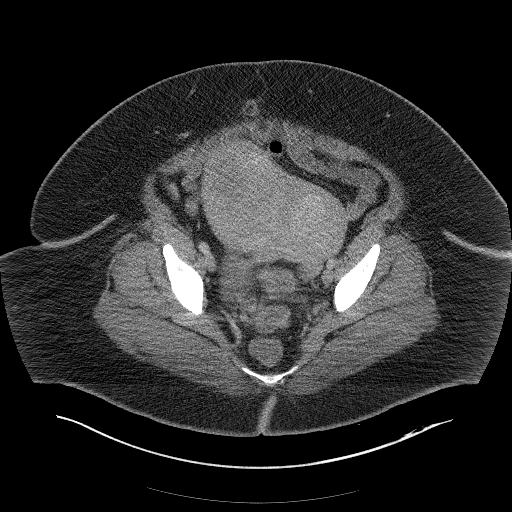
[im 41/98  soft-tissue]
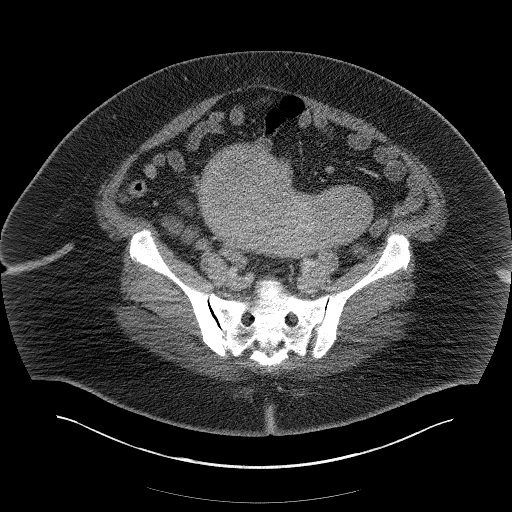
[im 46/98  soft-tissue]
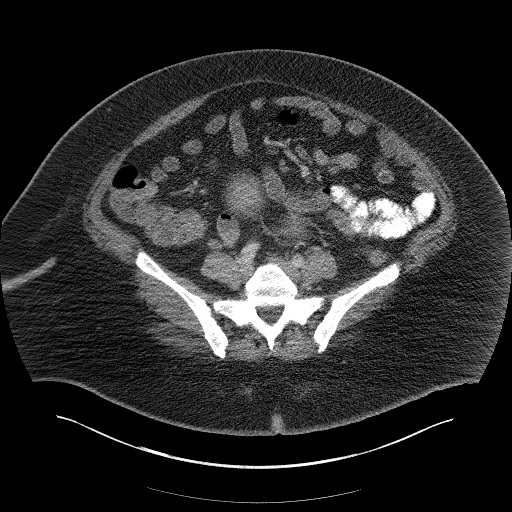
[im 52/98  soft-tissue]
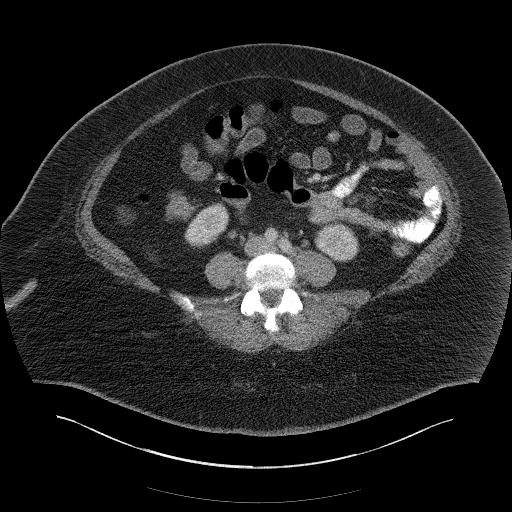
[im 57/98  soft-tissue]
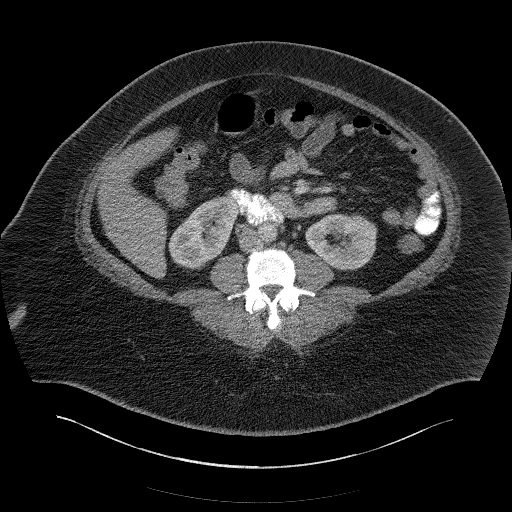
[im 57/98  bone]
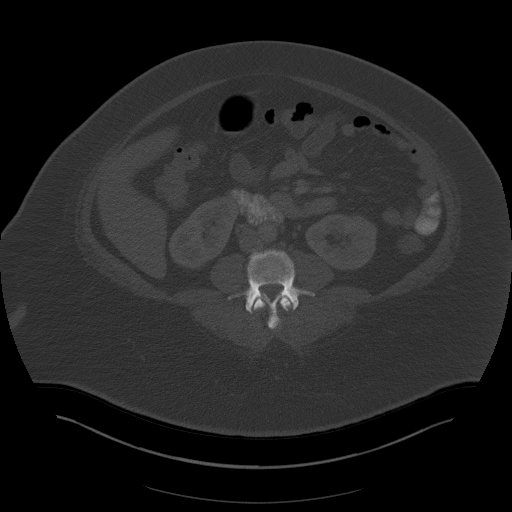
[im 67/98  soft-tissue]
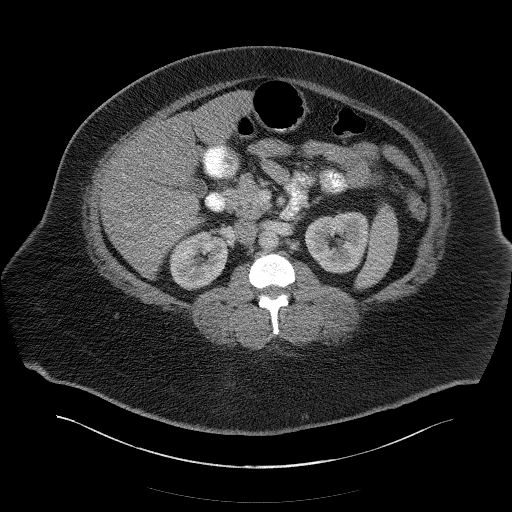
[im 72/98  soft-tissue]
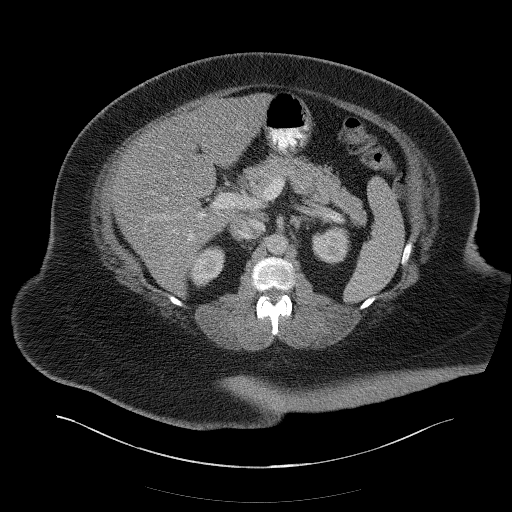
[im 77/98  soft-tissue]
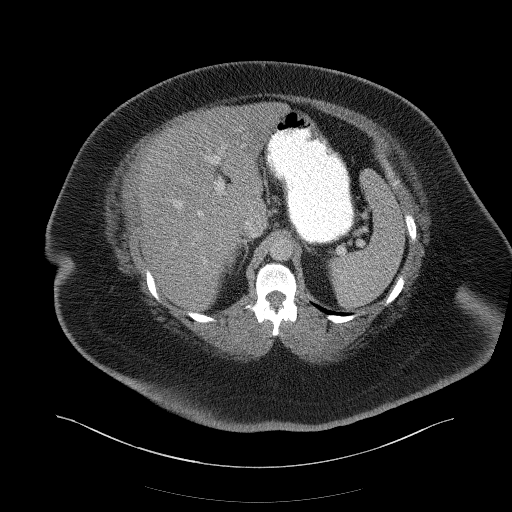
[im 87/98  soft-tissue]
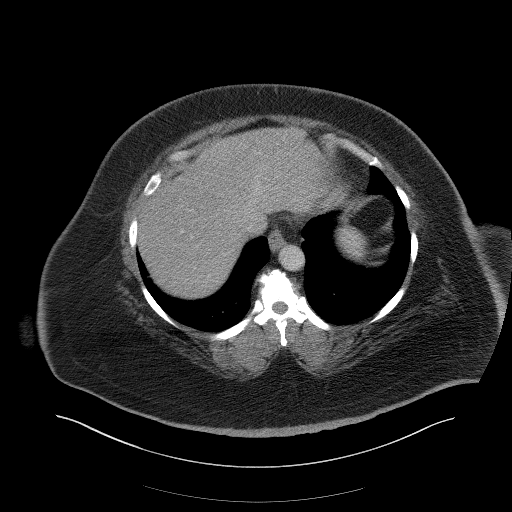
[im 92/98  soft-tissue]
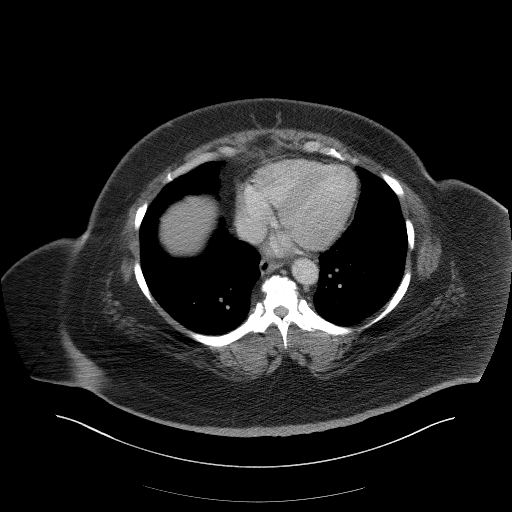

[Series 5: abd/pelvis 3.0 coronal · coronal · 1.14mm/px · 3 of 128 slices shown]
[im 43/128  soft-tissue]
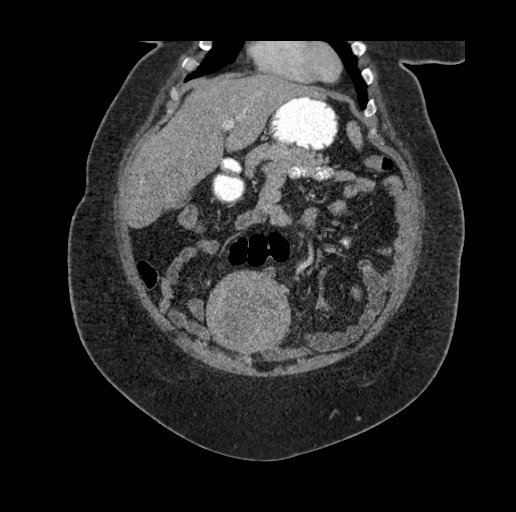
[im 57/128  soft-tissue]
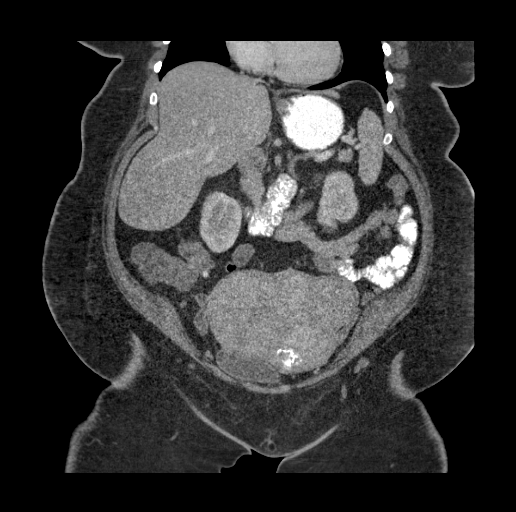
[im 71/128  soft-tissue]
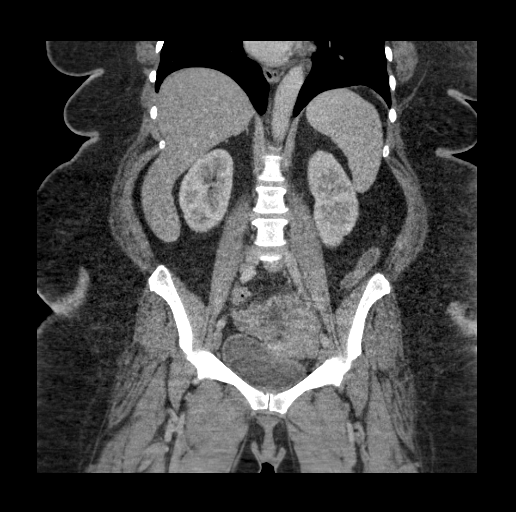

[17 of 46 positions shown; findings below may reference images not displayed]

FINDINGS: Evaluation is limited due to streak artifact caused by patient's
arms.

The visualized lung bases are clear. No intra-abdominal free air or
free fluid.

Apparent diffuse hepatic steatosis. The gallbladder, pancreas,
spleen, adrenal glands, kidneys, visualized ureters, and urinary
bladder appear unremarkable.

The uterus is enlarged with multiple fibroids. The largest fibroid
appears to measure up to 10 x 12 cm. A 4 cm rim calcified fibroid is
noted.

Loose stool noted throughout the colon compatible with diarrheal
state. Correlation with clinical exam and stool cultures
recommended. There is no evidence of bowel obstruction or
inflammation. Normal appendix.

The abdominal aorta and IVC appear unremarkable. No portal venous
gas identified. There is no lymphadenopathy. There is a small fat
containing umbilical hernia. A small fat containing supraumbilical
hernia is also noted. There is degenerative changes of the spine. No
acute fracture.
IMPRESSION: Diarrheal state. Correlation with clinical exam and stool cultures
recommended. No evidence of bowel obstruction or inflammation.

Enlarged and myomatous uterus.  MRI may provide better evaluation.

## 2018-04-01 ENCOUNTER — Emergency Department (HOSPITAL_BASED_OUTPATIENT_CLINIC_OR_DEPARTMENT_OTHER)
Admission: EM | Admit: 2018-04-01 | Discharge: 2018-04-02 | Disposition: A | Discharge status: Home / Self Care | Payer: Medicaid Other | Attending: Emergency Medicine | Admitting: Emergency Medicine

## 2018-04-01 ENCOUNTER — Other Ambulatory Visit: Payer: Self-pay

## 2018-04-01 ENCOUNTER — Encounter (HOSPITAL_BASED_OUTPATIENT_CLINIC_OR_DEPARTMENT_OTHER): Payer: Self-pay | Admitting: Emergency Medicine

## 2018-04-01 DIAGNOSIS — J45909 Unspecified asthma, uncomplicated: Secondary | ICD-10-CM | POA: Diagnosis not present

## 2018-04-01 DIAGNOSIS — J069 Acute upper respiratory infection, unspecified: Secondary | ICD-10-CM | POA: Diagnosis not present

## 2018-04-01 DIAGNOSIS — F419 Anxiety disorder, unspecified: Secondary | ICD-10-CM | POA: Diagnosis not present

## 2018-04-01 DIAGNOSIS — R51 Headache: Secondary | ICD-10-CM | POA: Diagnosis present

## 2018-04-01 DIAGNOSIS — Z7982 Long term (current) use of aspirin: Secondary | ICD-10-CM | POA: Diagnosis not present

## 2018-04-01 DIAGNOSIS — F329 Major depressive disorder, single episode, unspecified: Secondary | ICD-10-CM | POA: Diagnosis not present

## 2018-04-01 DIAGNOSIS — E119 Type 2 diabetes mellitus without complications: Secondary | ICD-10-CM | POA: Insufficient documentation

## 2018-04-01 DIAGNOSIS — G43809 Other migraine, not intractable, without status migrainosus: Secondary | ICD-10-CM | POA: Diagnosis not present

## 2018-04-01 DIAGNOSIS — Z794 Long term (current) use of insulin: Secondary | ICD-10-CM | POA: Diagnosis not present

## 2018-04-01 DIAGNOSIS — I10 Essential (primary) hypertension: Secondary | ICD-10-CM | POA: Insufficient documentation

## 2018-04-01 DIAGNOSIS — Z79899 Other long term (current) drug therapy: Secondary | ICD-10-CM | POA: Insufficient documentation

## 2018-04-01 MED ORDER — DIPHENHYDRAMINE HCL 50 MG/ML IJ SOLN
25.0000 mg | Freq: Once | INTRAMUSCULAR | Status: AC
Start: 2018-04-01 — End: 2018-04-01
  Administered 2018-04-01: 25 mg via INTRAVENOUS
  Filled 2018-04-01: qty 1

## 2018-04-01 MED ORDER — PROCHLORPERAZINE EDISYLATE 10 MG/2ML IJ SOLN
10.0000 mg | Freq: Once | INTRAMUSCULAR | Status: AC
  Administered 2018-04-01: 10 mg via INTRAVENOUS
  Filled 2018-04-01: qty 2

## 2018-04-01 NOTE — ED Notes (Signed)
Pt states pain to right side of face and head for 2 days after having cold symptoms. She is also c/o dental pain on lower right side.

## 2018-04-01 NOTE — ED Triage Notes (Signed)
Pt reports right side facial pain since 0700. States pain behind right eye and right side of face. Pt states she has taken aleve, oxycodone and used ice packs

## 2018-04-02 MED ORDER — SALINE SPRAY 0.65 % NA SOLN
1.0000 | NASAL | 0 refills | Status: AC | PRN
Start: 2018-04-02 — End: ?

## 2018-04-02 MED ORDER — KETOROLAC TROMETHAMINE 30 MG/ML IJ SOLN
30.0000 mg | Freq: Once | INTRAMUSCULAR | Status: AC
  Administered 2018-04-02: 30 mg via INTRAVENOUS
  Filled 2018-04-02: qty 1

## 2018-04-02 MED ORDER — MAGNESIUM SULFATE 2 GM/50ML IV SOLN
2.0000 g | Freq: Once | INTRAVENOUS | Status: AC
Start: 2018-04-02 — End: 2018-04-02
  Administered 2018-04-02: 2 g via INTRAVENOUS
  Filled 2018-04-02: qty 50

## 2018-04-02 MED ORDER — FLUTICASONE PROPIONATE 50 MCG/ACT NA SUSP: 2.0000 | Freq: Every day | NASAL | 0 refills | Status: AC

## 2018-04-02 NOTE — ED Provider Notes (Signed)
[facility] DEPART[facility] Provider Note   CSN: [id number] Arrival date & time: 04/01/18  2104     History   Chief Complaint Chief Complaint  Patient presents with  . Facial Pain    HPI Judy English is a 57 y.o. female.  HPI  This is a 57 year old female with a history of diabetes, asthma, hypertension, obesity who presents with headache.  Patient reports that she woke up this morning with right-sided headache and facial pain.  She states the pain is mostly behind her right eye and is throbbing in nature.  She rates her pain at 10 out of 10.  She has taken Aleve, oxycodone and ice packs with minimal relief.  She denies any fevers but does endorse "a cold."  She reports nasal congestion and cough.  Denies shortness of breath or chest pain.  Denies neck stiffness.  No significant history of headaches.  Denies any weakness, numbness, vision changes.  Past Medical History:  Diagnosis Date  . Anxiety   . Asthma   . Depression   . Diabetes mellitus   . Fibromyalgia   . Heart attack ([place])   . Hypertension   . Morbid obesity ([place])   . Neuropathy     Patient Active Problem List   Diagnosis Date Noted  . DIABETES MELLITUS, TYPE II, WITHOUT COMPLICATIONS 35/57/3220  . MORBID OBESITY 04/28/2008  . DEPRESSION 04/28/2008  . HYPERTENSION, ESSENTIAL 04/28/2008  . ASTHMA, UNSPECIFIED, UNSPECIFIED STATUS 04/28/2008  . GERD 04/28/2008  . DEGENERATIVE JOINT DISEASE, BOTH KNEES, SEVERE 04/28/2008  . SPONDYLOSIS, CERVICAL, WITHOUT MYELOPATHY 04/28/2008  . MYOFASCIAL PAIN SYNDROME 04/28/2008  . OBSTRUCTIVE SLEEP APNEA 05/02/2007    Past Surgical History:  Procedure Laterality Date  . ABDOMINAL EXPLORATION SURGERY    . CESAREAN SECTION    . UTERINE FIBROID SURGERY       OB History   None      Home Medications    Prior to Admission medications   Medication Sig Start Date End Date Taking? Authorizing Provider  albuterol (PROVENTIL HFA;VENTOLIN HFA) 108 (90  BASE) MCG/ACT inhaler Inhale 2 puffs into the lungs every 6 (six) hours as needed. For shortness of breath and wheezing     [provider]  aspirin 325 MG EC tablet Take 325 mg by mouth daily.      [provider]  beclomethasone (QVAR) 80 MCG/ACT inhaler Inhale 2 puffs into the lungs 2 (two) times daily.    [provider]  buPROPion (WELLBUTRIN) 75 MG tablet Take 75 mg by mouth daily.    [provider]  clonazePAM (KLONOPIN) 0.5 MG tablet Take 0.5 mg by mouth 3 (three) times daily as needed for anxiety.    [provider]  cyclobenzaprine (FLEXERIL) 10 MG tablet Take 1 tablet (10 mg total) by mouth 2 (two) times daily as needed for muscle spasms. 08/30/16   Drenda Freeze, MD  FLUoxetine (PROZAC) 40 MG capsule Take 40 mg by mouth daily.    [provider]  fluticasone (FLONASE) 50 MCG/ACT nasal spray Place 2 sprays into both nostrils daily. 04/02/18   Maliq Pilley, Barbette Hair, MD  Fluticasone-Salmeterol (ADVAIR) 500-50 MCG/DOSE AEPB Inhale 1 puff into the lungs every 12 (twelve) hours.      [provider]  guaiFENesin-codeine 100-10 MG/5ML syrup Take 10 mLs by mouth every 6 (six) hours as needed for cough. 06/20/16   Ward, Delice Bison, DO  insulin aspart (NOVOLOG) 100 UNIT/ML injection Inject 10 Units into the  skin 4 (four) times daily as needed for high blood sugar (sliding scale). 09/02/15   Fredia Sorrow, MD  Insulin Glargine (TOUJEO SOLOSTAR) 300 UNIT/ML SOPN Inject 90 Units into the skin 2 (two) times daily. 01/29/16   Recardo Evangelist, PA-C  mometasone (NASONEX) 50 MCG/ACT nasal spray Place 2 sprays into both nostrils daily.      [provider]  omeprazole (PRILOSEC) 40 MG capsule Take 40 mg by mouth daily.    [provider]  oxycodone (OXY-IR) 5 MG capsule Take 5 mg by mouth every 4 (four) hours as needed. For pain     [provider]  sodium chloride (OCEAN) 0.65 % SOLN nasal spray Place 1 spray into  both nostrils as needed for congestion. 04/02/18   Maylee Bare, Barbette Hair, MD  traMADol (ULTRAM) 50 MG tablet Take 1 tablet (50 mg total) by mouth every 6 (six) hours as needed. 09/02/15   Fredia Sorrow, MD    Family History History reviewed. No pertinent family history.  Social History Social History   Tobacco Use  . Smoking status: Never Smoker  . Smokeless tobacco: Never Used  Substance Use Topics  . Alcohol use: No  . Drug use: No     Allergies   Patient has no known allergies.   Review of Systems Review of Systems  Constitutional: Negative for fever.  HENT: Positive for congestion and rhinorrhea.   Eyes: Negative for photophobia and visual disturbance.  Respiratory: Positive for cough. Negative for shortness of breath.   Cardiovascular: Negative for chest pain.  Neurological: Positive for headaches. Negative for weakness and numbness.  All other systems reviewed and are negative.    Physical Exam Updated Vital Signs BP (!) 138/92 (BP Location: Right Arm)   Pulse 87   Temp 98.3 F (36.8 C) (Oral)   Resp 18   Ht 1.727 m (5\' 8" )   Wt 122.9 kg   LMP  (Approximate)   SpO2 99%   BMI 41.21 kg/m   Physical Exam  Constitutional: She is oriented to person, place, and time. She appears well-developed and well-nourished.  Obese, no acute distress  HENT:  Head: Normocephalic and atraumatic.  Rhinorrhea noted, tenderness to palpation over the right frontal and maxillary sinuses  Eyes: Pupils are equal, round, and reactive to light.  Neck: Normal range of motion.  No meningismus  Cardiovascular: Normal rate, regular rhythm and normal heart sounds.  Pulmonary/Chest: Effort normal and breath sounds normal. No respiratory distress. She has no wheezes.  Abdominal: Soft. There is no tenderness.  Neurological: She is alert and oriented to person, place, and time.  Cranial nerves II through XII intact, 5 out of 5 strength in all 4 extremities, no dysmetria to  finger-nose-finger  Skin: Skin is warm and dry.  Psychiatric: She has a normal mood and affect.  Nursing note and vitals reviewed.    ED Treatments / Results  Labs (all labs ordered are listed, but only abnormal results are displayed) Labs Reviewed - No data to display  EKG None  Radiology No results found.  Procedures Procedures (including critical care time)  Medications Ordered in ED Medications  prochlorperazine (COMPAZINE) injection 10 mg (10 mg Intravenous Given 04/01/18 2332)  diphenhydrAMINE (BENADRYL) injection 25 mg (25 mg Intravenous Given 04/01/18 2332)  magnesium sulfate IVPB 2 g 50 mL (2 g Intravenous New Bag/Given 04/02/18 0114)  ketorolac (TORADOL) 30 MG/ML injection 30 mg (30 mg Intravenous Given 04/02/18 0110)     Initial Impression /  Assessment and Plan / ED Course  I have reviewed the triage vital signs and the nursing notes.  Pertinent labs & imaging results that were available during my care of the patient were reviewed by me and considered in my medical decision making (see chart for details).  Clinical Course as of Apr 02 210  Mon Apr 02, 2018  0106 Recheck, patient continues to endorse some right-sided pain.  Will re-dose.   [CH]    Clinical Course User Index [CH] Thora Scherman, Barbette Hair, MD    Patient presents with headache upper respiratory symptoms.  Suspect headache may be related to sinus pressure given tenderness over the maxillary and frontal sinuses.  She is overall nontoxic-appearing and vital signs are reassuring.  No signs or symptoms of meningitis.  She is neurologically intact.  No indication for imaging at this time.  Patient was initially given Compazine and Benadryl.  She subsequently was given Toradol and magnesium.  On multiple rechecks she is resting comfortably.  On last recheck, symptoms have resolved.  Recommend Flonase and nasal saline.  Given that she is afebrile, would hold off on antibiotics at this time for any sinus  infection.  After history, exam, and medical workup I feel the patient has been appropriately medically screened and is safe for discharge home. Pertinent diagnoses were discussed with the patient. Patient was given return precautions.   Final Clinical Impressions(s) / ED Diagnoses   Final diagnoses:  Other migraine without status migrainosus, not intractable  Upper respiratory tract infection, unspecified type    ED Discharge Orders         Ordered    fluticasone (FLONASE) 50 MCG/ACT nasal spray  Daily     04/02/18 0211    sodium chloride (OCEAN) 0.65 % SOLN nasal spray  As needed     04/02/18 0211           Addilee Neu, Barbette Hair, MD 04/02/18 0302

## 2018-07-07 ENCOUNTER — Other Ambulatory Visit: Payer: Self-pay

## 2018-07-07 ENCOUNTER — Emergency Department (HOSPITAL_BASED_OUTPATIENT_CLINIC_OR_DEPARTMENT_OTHER): Payer: Medicaid Other

## 2018-07-07 ENCOUNTER — Emergency Department (HOSPITAL_BASED_OUTPATIENT_CLINIC_OR_DEPARTMENT_OTHER)
Admission: EM | Admit: 2018-07-07 | Discharge: 2018-07-07 | Disposition: A | Discharge status: Home / Self Care | Payer: Medicaid Other | Attending: Emergency Medicine | Admitting: Emergency Medicine

## 2018-07-07 ENCOUNTER — Encounter (HOSPITAL_BASED_OUTPATIENT_CLINIC_OR_DEPARTMENT_OTHER): Payer: Self-pay | Admitting: Adult Health

## 2018-07-07 DIAGNOSIS — Z79899 Other long term (current) drug therapy: Secondary | ICD-10-CM | POA: Diagnosis not present

## 2018-07-07 DIAGNOSIS — I10 Essential (primary) hypertension: Secondary | ICD-10-CM | POA: Insufficient documentation

## 2018-07-07 DIAGNOSIS — R1011 Right upper quadrant pain: Secondary | ICD-10-CM | POA: Diagnosis present

## 2018-07-07 DIAGNOSIS — R112 Nausea with vomiting, unspecified: Secondary | ICD-10-CM | POA: Insufficient documentation

## 2018-07-07 DIAGNOSIS — J45909 Unspecified asthma, uncomplicated: Secondary | ICD-10-CM | POA: Diagnosis not present

## 2018-07-07 DIAGNOSIS — R1084 Generalized abdominal pain: Secondary | ICD-10-CM | POA: Diagnosis not present

## 2018-07-07 DIAGNOSIS — Z794 Long term (current) use of insulin: Secondary | ICD-10-CM | POA: Diagnosis not present

## 2018-07-07 DIAGNOSIS — E119 Type 2 diabetes mellitus without complications: Secondary | ICD-10-CM | POA: Insufficient documentation

## 2018-07-07 LAB — CBC
HCT: 36.7 % (ref 36.0–46.0)
HEMOGLOBIN: 12 g/dL (ref 12.0–15.0)
MCH: 27.3 pg (ref 26.0–34.0)
MCHC: 32.7 g/dL (ref 30.0–36.0)
MCV: 83.4 fL (ref 80.0–100.0)
Platelets: 211 10*3/uL (ref 150–400)
RBC: 4.4 MIL/uL (ref 3.87–5.11)
RDW: 11.9 % (ref 11.5–15.5)
WBC: 6.2 10*3/uL (ref 4.0–10.5)
nRBC: 0 % (ref 0.0–0.2)

## 2018-07-07 LAB — URINALYSIS, ROUTINE W REFLEX MICROSCOPIC
Bilirubin Urine: NEGATIVE
Glucose, UA: 250 mg/dL — AB
Ketones, ur: NEGATIVE mg/dL
Nitrite: NEGATIVE
Protein, ur: 100 mg/dL — AB
Specific Gravity, Urine: 1.025 (ref 1.005–1.030)
pH: 5.5 (ref 5.0–8.0)

## 2018-07-07 LAB — COMPREHENSIVE METABOLIC PANEL
ALT: 13 U/L (ref 0–44)
ANION GAP: 7 (ref 5–15)
AST: 16 U/L (ref 15–41)
Albumin: 3.7 g/dL (ref 3.5–5.0)
Alkaline Phosphatase: 75 U/L (ref 38–126)
BUN: 22 mg/dL — ABNORMAL HIGH (ref 6–20)
CHLORIDE: 105 mmol/L (ref 98–111)
CO2: 24 mmol/L (ref 22–32)
Calcium: 8.7 mg/dL — ABNORMAL LOW (ref 8.9–10.3)
Creatinine, Ser: 1.06 mg/dL — ABNORMAL HIGH (ref 0.44–1.00)
GFR, EST NON AFRICAN AMERICAN: 59 mL/min — AB (ref >60)
Glucose, Bld: 316 mg/dL — ABNORMAL HIGH (ref 70–99)
POTASSIUM: 3.9 mmol/L (ref 3.5–5.1)
Sodium: 136 mmol/L (ref 135–145)
Total Bilirubin: 0.6 mg/dL (ref 0.3–1.2)
Total Protein: 7.6 g/dL (ref 6.5–8.1)

## 2018-07-07 LAB — URINALYSIS, MICROSCOPIC (REFLEX): Squamous Epithelial / HPF: >50 (ref 0–5)

## 2018-07-07 LAB — LIPASE, BLOOD: LIPASE: 73 U/L — AB (ref 11–51)

## 2018-07-07 LAB — CBG MONITORING, ED: Glucose-Capillary: 290 mg/dL — ABNORMAL HIGH (ref 70–99)

## 2018-07-07 LAB — PREGNANCY, URINE: Preg Test, Ur: NEGATIVE

## 2018-07-07 MED ORDER — SODIUM CHLORIDE 0.9 % IV BOLUS
500.0000 mL | Freq: Once | INTRAVENOUS | Status: AC
  Administered 2018-07-07: 500 mL via INTRAVENOUS

## 2018-07-07 MED ORDER — DIPHENHYDRAMINE HCL 50 MG/ML IJ SOLN
50.0000 mg | Freq: Once | INTRAMUSCULAR | Status: AC
  Administered 2018-07-07: 50 mg via INTRAVENOUS
  Filled 2018-07-07: qty 1

## 2018-07-07 MED ORDER — SODIUM CHLORIDE 0.9% FLUSH
3.0000 mL | Freq: Once | INTRAVENOUS | Status: DC
  Filled 2018-07-07: qty 3

## 2018-07-07 MED ORDER — DICYCLOMINE HCL 20 MG PO TABS: 20.0000 mg | ORAL_TABLET | Freq: Two times a day (BID) | ORAL | 0 refills | Status: DC

## 2018-07-07 MED ORDER — HYDROCODONE-ACETAMINOPHEN 5-325 MG PO TABS: 1.0000 | ORAL_TABLET | Freq: Four times a day (QID) | ORAL | 0 refills | Status: DC | PRN

## 2018-07-07 MED ORDER — ONDANSETRON HCL 4 MG PO TABS: 4.0000 mg | ORAL_TABLET | Freq: Four times a day (QID) | ORAL | 0 refills | Status: AC

## 2018-07-07 MED ORDER — ONDANSETRON HCL 4 MG/2ML IJ SOLN
4.0000 mg | Freq: Once | INTRAMUSCULAR | Status: AC | PRN
  Administered 2018-07-07: 4 mg via INTRAVENOUS
  Filled 2018-07-07: qty 2

## 2018-07-07 MED ORDER — IOHEXOL 300 MG/ML  SOLN
100.0000 mL | Freq: Once | INTRAMUSCULAR | Status: AC | PRN
  Administered 2018-07-07: 100 mL via INTRAVENOUS

## 2018-07-07 MED ORDER — MORPHINE SULFATE (PF) 4 MG/ML IV SOLN
4.0000 mg | Freq: Once | INTRAVENOUS | Status: AC
  Administered 2018-07-07: 4 mg via INTRAVENOUS
  Filled 2018-07-07: qty 1

## 2018-07-07 NOTE — ED Triage Notes (Signed)
Present with RUQ abdominal pain that goes into her back, the pain began last night after eating a large meal. SHe has vomited x3. Denies diarrhea and fever. She is tender in the RUQ.

## 2018-07-07 NOTE — ED Notes (Addendum)
Pt c/o ROQ pain that she states radiates to back. Pt states she felt fine until after eating dinner. She states daughter, who is with patient,  ate same meal and is also not feeling well. Pt states last emesis episode was t 0830 this morning. Denies eating today. Denies fever, denies taking OTC or prescribed medications today

## 2018-07-07 NOTE — Discharge Instructions (Addendum)
I suspect your abdominal pain and vomiting may be food related.  CT scan did not show any acute findings.  You will be called if your urine culture returns positive and you require an antibiotic.  Take Bentyl twice daily as needed for abdominal cramping.  Take Zofran every 6 hours as needed for nausea or vomiting.  For severe, breakthrough pain, take 1 Norco every 6 hours.  Begin with clear liquids and progress your diet as tolerated to bland foods such as bananas, rice, applesauce, toast.  Avoid any greasy, fatty, spicy foods for a while.  Please take your blood pressure medication when you arrive back home.  Please follow-up with your doctor if your symptoms are not improving over the next couple days.  Please return emergency department he develop any new or worsening symptoms including persistent fever, intractable vomiting, severe, worsening right upper quadrant pain, or any other new or concerning symptoms.  The radiologist recommended that you have a repeat CT scan of your chest in 6 to 12 months to assess the 6 mm lung nodule seen on your scan today.

## 2018-07-07 NOTE — ED Notes (Signed)
Patient transported to CT 

## 2018-07-07 NOTE — ED Notes (Signed)
Pt informed CT tech Evelena Peat that she has had previous reaction to contrast.

## 2018-07-07 NOTE — ED Notes (Signed)
ED Provider at bedside. 

## 2018-07-07 NOTE — ED Provider Notes (Signed)
[facility] Provider Note   CSN: [id number] Arrival date & time: 07/07/18  1022    History   Chief Complaint Chief Complaint  Patient presents with   Abdominal Pain    HPI Judy English is a 58 y.o. female with history of obesity, hypertension, diabetes, asthma who presents with right upper quadrant and periumbilical pain that is been constant since last evening.  Patient reports she ate a large meal and was concerned maybe it was not completely cooked.  She has had nausea and vomiting.  She reports her pain is been radiating to her back.  She denies any diarrhea.  She last had a bowel movement before eating last night.  She denies any urinary symptoms.  No medications taken prior to arrival.  Patient has had an exploratory abdominal surgery as well as a uterine fibroid surgery.  She has a known ventral hernia which is also hurting since last evening.     HPI  Past Medical History:  Diagnosis Date   Anxiety    Asthma    Depression    Diabetes mellitus    Fibromyalgia    Heart attack ([place])    Hypertension    Morbid obesity ([place])    Neuropathy     Patient Active Problem List   Diagnosis Date Noted   DIABETES MELLITUS, TYPE II, WITHOUT COMPLICATIONS 27/09/2374   MORBID OBESITY 04/28/2008   DEPRESSION 04/28/2008   HYPERTENSION, ESSENTIAL 04/28/2008   ASTHMA, UNSPECIFIED, UNSPECIFIED STATUS 04/28/2008   GERD 04/28/2008   DEGENERATIVE JOINT DISEASE, BOTH KNEES, SEVERE 04/28/2008   SPONDYLOSIS, CERVICAL, WITHOUT MYELOPATHY 04/28/2008   MYOFASCIAL PAIN SYNDROME 04/28/2008   OBSTRUCTIVE SLEEP APNEA 05/02/2007    Past Surgical History:  Procedure Laterality Date   ABDOMINAL EXPLORATION SURGERY     CESAREAN SECTION     UTERINE FIBROID SURGERY       OB History   No obstetric history on file.      Home Medications    Prior to Admission medications   Medication Sig Start Date End Date Taking? Authorizing  Provider  albuterol (PROVENTIL HFA;VENTOLIN HFA) 108 (90 BASE) MCG/ACT inhaler Inhale 2 puffs into the lungs every 6 (six) hours as needed. For shortness of breath and wheezing     [provider]  aspirin 325 MG EC tablet Take 325 mg by mouth daily.      [provider]  beclomethasone (QVAR) 80 MCG/ACT inhaler Inhale 2 puffs into the lungs 2 (two) times daily.    [provider]  buPROPion (WELLBUTRIN) 75 MG tablet Take 75 mg by mouth daily.    [provider]  clonazePAM (KLONOPIN) 0.5 MG tablet Take 0.5 mg by mouth 3 (three) times daily as needed for anxiety.    [provider]  cyclobenzaprine (FLEXERIL) 10 MG tablet Take 1 tablet (10 mg total) by mouth 2 (two) times daily as needed for muscle spasms. 08/30/16   Drenda Freeze, MD  dicyclomine (BENTYL) 20 MG tablet Take 1 tablet (20 mg total) by mouth 2 (two) times daily. 07/07/18   Kenyada Dosch, Bea Graff, PA-C  FLUoxetine (PROZAC) 40 MG capsule Take 40 mg by mouth daily.    [provider]  fluticasone (FLONASE) 50 MCG/ACT nasal spray Place 2 sprays into both nostrils daily. 04/02/18   Horton, Barbette Hair, MD  Fluticasone-Salmeterol (ADVAIR) 500-50 MCG/DOSE AEPB Inhale 1 puff into the lungs every 12 (twelve) hours.      [provider]  guaiFENesin-codeine 100-10  MG/5ML syrup Take 10 mLs by mouth every 6 (six) hours as needed for cough. 06/20/16   Ward, Delice Bison, DO  HYDROcodone-acetaminophen (NORCO/VICODIN) 5-325 MG tablet Take 1 tablet by mouth every 6 (six) hours as needed for severe pain. 07/07/18   Yashika Mask, Bea Graff, PA-C  insulin aspart (NOVOLOG) 100 UNIT/ML injection Inject 10 Units into the skin 4 (four) times daily as needed for high blood sugar (sliding scale). 09/02/15   Fredia Sorrow, MD  Insulin Glargine (TOUJEO SOLOSTAR) 300 UNIT/ML SOPN Inject 90 Units into the skin 2 (two) times daily. 01/29/16   Recardo Evangelist, PA-C  mometasone (NASONEX) 50 MCG/ACT nasal spray Place 2  sprays into both nostrils daily.      [provider]  omeprazole (PRILOSEC) 40 MG capsule Take 40 mg by mouth daily.    [provider]  ondansetron (ZOFRAN) 4 MG tablet Take 1 tablet (4 mg total) by mouth every 6 (six) hours. 07/07/18   Avien Taha, Bea Graff, PA-C  oxycodone (OXY-IR) 5 MG capsule Take 5 mg by mouth every 4 (four) hours as needed. For pain     [provider]  sodium chloride (OCEAN) 0.65 % SOLN nasal spray Place 1 spray into both nostrils as needed for congestion. 04/02/18   Horton, Barbette Hair, MD  traMADol (ULTRAM) 50 MG tablet Take 1 tablet (50 mg total) by mouth every 6 (six) hours as needed. 09/02/15   Fredia Sorrow, MD    Family History History reviewed. No pertinent family history.  Social History Social History   Tobacco Use   Smoking status: Never Smoker   Smokeless tobacco: Never Used  Substance Use Topics   Alcohol use: No   Drug use: No     Allergies   Omnipaque [iohexol]   Review of Systems Review of Systems  Constitutional: Negative for chills and fever.  HENT: Negative for facial swelling and sore throat.   Respiratory: Negative for shortness of breath.   Cardiovascular: Negative for chest pain.  Gastrointestinal: Positive for abdominal pain, nausea and vomiting.  Genitourinary: Negative for dysuria.  Musculoskeletal: Positive for back pain.  Skin: Negative for rash and wound.  Neurological: Negative for headaches.  Psychiatric/Behavioral: The patient is not nervous/anxious.      Physical Exam Updated Vital Signs BP (!) 165/108 (BP Location: Right Wrist)    Pulse 81    Temp 98.1 F (36.7 C) (Oral)    Resp 20    Ht 5\' 7"  (1.702 m)    Wt 121.1 kg    SpO2 96%    BMI 41.82 kg/m   Physical Exam Vitals signs and nursing note reviewed.  Constitutional:      General: She is not in acute distress.    Appearance: She is well-developed. She is not diaphoretic.  HENT:     Head: Normocephalic and atraumatic.      Mouth/Throat:     Pharynx: No oropharyngeal exudate.  Eyes:     General: No scleral icterus.       Right eye: No discharge.        Left eye: No discharge.     Conjunctiva/sclera: Conjunctivae normal.     Pupils: Pupils are equal, round, and reactive to light.  Neck:     Musculoskeletal: Normal range of motion and neck supple.     Thyroid: No thyromegaly.  Cardiovascular:     Rate and Rhythm: Normal rate and regular rhythm.     Heart sounds: Normal heart sounds. No murmur. No  friction rub. No gallop.   Pulmonary:     Effort: Pulmonary effort is normal. No respiratory distress.     Breath sounds: Normal breath sounds. No stridor. No wheezing or rales.  Abdominal:     General: Bowel sounds are normal. There is no distension.     Palpations: Abdomen is soft.     Tenderness: There is abdominal tenderness in the right upper quadrant, epigastric area and periumbilical area. There is no guarding or rebound. Positive signs include Murphy's sign. Negative signs include McBurney's sign.  Lymphadenopathy:     Cervical: No cervical adenopathy.  Skin:    General: Skin is warm and dry.     Coloration: Skin is not pale.     Findings: No rash.  Neurological:     Mental Status: She is alert.     Coordination: Coordination normal.      ED Treatments / Results  Labs (all labs ordered are listed, but only abnormal results are displayed) Labs Reviewed  LIPASE, BLOOD - Abnormal; Notable for the following components:      Result Value   Lipase 73 (*)    All other components within normal limits  COMPREHENSIVE METABOLIC PANEL - Abnormal; Notable for the following components:   Glucose, Bld 316 (*)    BUN 22 (*)    Creatinine, Ser 1.06 (*)    Calcium 8.7 (*)    GFR calc non Af Amer 59 (*)    All other components within normal limits  URINALYSIS, ROUTINE W REFLEX MICROSCOPIC - Abnormal; Notable for the following components:   APPearance CLOUDY (*)    Glucose, UA 250 (*)    Hgb urine dipstick  SMALL (*)    Protein, ur 100 (*)    Leukocytes,Ua SMALL (*)    All other components within normal limits  URINALYSIS, MICROSCOPIC (REFLEX) - Abnormal; Notable for the following components:   Bacteria, UA MANY (*)    All other components within normal limits  CBG MONITORING, ED - Abnormal; Notable for the following components:   Glucose-Capillary 290 (*)    All other components within normal limits  URINE CULTURE  CBC  PREGNANCY, URINE    EKG None  Radiology Ct Abdomen Pelvis W Contrast  Result Date: 07/07/2018 CLINICAL DATA:  Diffuse abdominal pain.  Right upper quadrant pain. EXAM: CT ABDOMEN AND PELVIS WITH CONTRAST TECHNIQUE: Multidetector CT imaging of the abdomen and pelvis was performed using the standard protocol following bolus administration of intravenous contrast. CONTRAST:  149mL OMNIPAQUE IOHEXOL 300 MG/ML  SOLN COMPARISON:  August 15, 2017 FINDINGS: Lower chest: There is a nodule in the left lung base on series 4, image 1 measuring 6.1 mm, likely associated with the left fissure. No other abnormalities in the lower chest. Hepatobiliary: No focal liver abnormality is seen. No gallstones, gallbladder wall thickening, or biliary dilatation. Pancreas: Unremarkable. No pancreatic ductal dilatation or surrounding inflammatory changes. Spleen: Normal in size without focal abnormality. Adrenals/Urinary Tract: Adrenal glands are normal. The kidneys are normal. There is a focus of low attenuation associated with the left ureter on series 7, image 30. The ureter is taking a turn in this region and this may represent volume averaging or may be due to an adjacent vessel. The ureters are otherwise normal. The bladder is unremarkable. Stomach/Bowel: Stomach is within normal limits. Appendix appears normal. No evidence of bowel wall thickening, distention, or inflammatory changes. Vascular/Lymphatic: No significant vascular findings are present. No enlarged abdominal or pelvic lymph nodes.  Reproductive:  Large fibroid uterus, unchanged. Other: There is a fat containing ventral hernia. Musculoskeletal: No acute or significant osseous findings. IMPRESSION: 1. There is a focus of low attenuation associated with the left ureter which is probably either artifactual due to a turn in the ureter or due to a crossing vessel. A true ureteral lesion is considered less likely. Recommend correlation with urinalysis to exclude hematuria. 2. There is a nodule in the left lung base on series 4, image 1 measuring 6.1 mm. Non-contrast chest CT at 6-12 months is recommended. If the nodule is stable at time of repeat CT, then future CT at 18-24 months (from today's scan) is considered optional for low-risk patients, but is recommended for high-risk patients. This recommendation follows the consensus statement: Guidelines for Management of Incidental Pulmonary Nodules Detected on CT Images: From the Fleischner Society 2017; Radiology 2017; 284:228-243. 3. Fibroid uterus. 4. Fat containing ventral hernia. Electronically Signed   By: Dorise Bullion III M.D   On: 07/07/2018 14:11    Procedures Procedures (including critical care time)  Medications Ordered in ED Medications  ondansetron (ZOFRAN) injection 4 mg (4 mg Intravenous Given 07/07/18 1154)  sodium chloride 0.9 % bolus 500 mL (0 mLs Intravenous Stopped 07/07/18 1400)  morphine 4 MG/ML injection 4 mg (4 mg Intravenous Given 07/07/18 1157)  iohexol (OMNIPAQUE) 300 MG/ML solution 100 mL (100 mLs Intravenous Contrast Given 07/07/18 1318)  diphenhydrAMINE (BENADRYL) injection 50 mg (50 mg Intravenous Given 07/07/18 1229)     Initial Impression / Assessment and Plan / ED Course  I have reviewed the triage vital signs and the nursing notes.  Pertinent labs & imaging results that were available during my care of the patient were reviewed by me and considered in my medical decision making (see chart for details).  Clinical Course as of Jul 07 1918  Sat Jul 07, 2018  1222 Patient apparently had itching once before from a CT scan however her most recent 1, there is no note or report that the patient had any itching or allergic reaction.  She has no history of anaphylactic reaction to IV contrast.  There is no allergy in her chart.  Will pretreat with Benadryl 50 mg to be safe.   [AL]    Clinical Course User Index [AL] Frederica Kuster, PA-C       Patient presenting with abdominal pain after eating questionable food last evening.  She has had associated nausea and vomiting.  She had mostly right upper quadrant tenderness, however some periumbilical tenderness as well.  CT abdomen pelvis shows normal gallbladder, possible artifact in the left ureter.  There is also a left lung nodule measuring 6.1 mm.  Patient advised to have noncontrast CT 6 to 12 months.  CT also showed fat-containing ventral hernia and fibroid uterus.  Patient feeling improved after morphine and Zofran in the ED.  Suspect possible food poisoning.  Labs are unremarkable.  Patient will discharge home with short course of Norco, Zofran, and Bentyl.  Follow-up to PCP as needed.  Return precautions discussed.  Patient understands and agrees with plan.  Patient vital stable throughout ED course and discharged in satisfactory condition.  Final Clinical Impressions(s) / ED Diagnoses   Final diagnoses:  Generalized abdominal pain  Right upper quadrant abdominal pain  Non-intractable vomiting with nausea, unspecified vomiting type    ED Discharge Orders         Ordered    dicyclomine (BENTYL) 20 MG tablet  2 times daily  07/07/18 1504    HYDROcodone-acetaminophen (NORCO/VICODIN) 5-325 MG tablet  Every 6 hours PRN     07/07/18 1504    ondansetron (ZOFRAN) 4 MG tablet  Every 6 hours     07/07/18 1504           Terry Abila, [place], PA-C 07/07/18 1925    Lajean Saver, MD 07/08/18 [id number]

## 2018-07-08 LAB — URINE CULTURE

## 2018-07-14 ENCOUNTER — Emergency Department (HOSPITAL_COMMUNITY): Payer: Medicaid Other

## 2018-07-14 ENCOUNTER — Other Ambulatory Visit: Payer: Self-pay

## 2018-07-14 ENCOUNTER — Emergency Department (HOSPITAL_COMMUNITY)
Admission: EM | Admit: 2018-07-14 | Discharge: 2018-07-14 | Disposition: A | Discharge status: Home / Self Care | Payer: Medicaid Other | Attending: Emergency Medicine | Admitting: Emergency Medicine

## 2018-07-14 ENCOUNTER — Encounter (HOSPITAL_COMMUNITY): Payer: Self-pay | Admitting: Emergency Medicine

## 2018-07-14 DIAGNOSIS — R1084 Generalized abdominal pain: Secondary | ICD-10-CM | POA: Insufficient documentation

## 2018-07-14 DIAGNOSIS — Z794 Long term (current) use of insulin: Secondary | ICD-10-CM | POA: Insufficient documentation

## 2018-07-14 DIAGNOSIS — Z91040 Latex allergy status: Secondary | ICD-10-CM | POA: Insufficient documentation

## 2018-07-14 DIAGNOSIS — J45909 Unspecified asthma, uncomplicated: Secondary | ICD-10-CM | POA: Insufficient documentation

## 2018-07-14 DIAGNOSIS — I10 Essential (primary) hypertension: Secondary | ICD-10-CM | POA: Insufficient documentation

## 2018-07-14 DIAGNOSIS — R109 Unspecified abdominal pain: Secondary | ICD-10-CM

## 2018-07-14 DIAGNOSIS — E119 Type 2 diabetes mellitus without complications: Secondary | ICD-10-CM | POA: Diagnosis not present

## 2018-07-14 DIAGNOSIS — Z79899 Other long term (current) drug therapy: Secondary | ICD-10-CM | POA: Insufficient documentation

## 2018-07-14 DIAGNOSIS — Z7982 Long term (current) use of aspirin: Secondary | ICD-10-CM | POA: Diagnosis not present

## 2018-07-14 LAB — URINALYSIS, ROUTINE W REFLEX MICROSCOPIC
BILIRUBIN URINE: NEGATIVE
Glucose, UA: >=500 mg/dL — AB
Ketones, ur: NEGATIVE mg/dL
LEUKOCYTE UA: NEGATIVE
NITRITE: NEGATIVE
PROTEIN: 100 mg/dL — AB
Specific Gravity, Urine: 1.016 (ref 1.005–1.030)
pH: 5 (ref 5.0–8.0)

## 2018-07-14 LAB — CBC WITH DIFFERENTIAL/PLATELET
Abs Immature Granulocytes: 0.02 10*3/uL (ref 0.00–0.07)
Basophils Absolute: 0 10*3/uL (ref 0.0–0.1)
Basophils Relative: 0 %
EOS PCT: 3 %
Eosinophils Absolute: 0.2 10*3/uL (ref 0.0–0.5)
HCT: 36.8 % (ref 36.0–46.0)
Hemoglobin: 11.9 g/dL — ABNORMAL LOW (ref 12.0–15.0)
Immature Granulocytes: 0 %
Lymphocytes Relative: 24 %
Lymphs Abs: 1.8 10*3/uL (ref 0.7–4.0)
MCH: 26.6 pg (ref 26.0–34.0)
MCHC: 32.3 g/dL (ref 30.0–36.0)
MCV: 82.1 fL (ref 80.0–100.0)
Monocytes Absolute: 0.4 10*3/uL (ref 0.1–1.0)
Monocytes Relative: 6 %
Neutro Abs: 4.8 10*3/uL (ref 1.7–7.7)
Neutrophils Relative %: 67 %
Platelets: 216 10*3/uL (ref 150–400)
RBC: 4.48 MIL/uL (ref 3.87–5.11)
RDW: 11.8 % (ref 11.5–15.5)
WBC: 7.3 10*3/uL (ref 4.0–10.5)
nRBC: 0 % (ref 0.0–0.2)

## 2018-07-14 LAB — I-STAT BETA HCG BLOOD, ED (MC, WL, AP ONLY): I-stat hCG, quantitative: 7.4 m[IU]/mL — ABNORMAL HIGH (ref <5)

## 2018-07-14 LAB — LIPASE, BLOOD: LIPASE: 84 U/L — AB (ref 11–51)

## 2018-07-14 LAB — CBG MONITORING, ED
Glucose-Capillary: 425 mg/dL — ABNORMAL HIGH (ref 70–99)
Glucose-Capillary: 341 mg/dL — ABNORMAL HIGH (ref 70–99)
Glucose-Capillary: 298 mg/dL — ABNORMAL HIGH (ref 70–99)

## 2018-07-14 LAB — PREGNANCY, URINE: Preg Test, Ur: NEGATIVE

## 2018-07-14 MED ORDER — METHOCARBAMOL 500 MG PO TABS: 500.0000 mg | ORAL_TABLET | Freq: Two times a day (BID) | ORAL | 0 refills | Status: DC | PRN

## 2018-07-14 MED ORDER — SODIUM CHLORIDE 0.9 % IV BOLUS
1000.0000 mL | Freq: Once | INTRAVENOUS | Status: AC
  Administered 2018-07-14: 1000 mL via INTRAVENOUS

## 2018-07-14 MED ORDER — DIPHENHYDRAMINE HCL 50 MG/ML IJ SOLN
50.0000 mg | Freq: Once | INTRAMUSCULAR | Status: AC
  Administered 2018-07-14: 50 mg via INTRAVENOUS
  Filled 2018-07-14: qty 1

## 2018-07-14 MED ORDER — MORPHINE SULFATE (PF) 4 MG/ML IV SOLN
4.0000 mg | Freq: Once | INTRAVENOUS | Status: AC
  Administered 2018-07-14: 4 mg via INTRAVENOUS
  Filled 2018-07-14: qty 1

## 2018-07-14 MED ORDER — ONDANSETRON HCL 4 MG/2ML IJ SOLN
4.0000 mg | Freq: Once | INTRAMUSCULAR | Status: AC
  Administered 2018-07-14: 4 mg via INTRAVENOUS
  Filled 2018-07-14: qty 2

## 2018-07-14 MED ORDER — OXYCODONE HCL 5 MG PO TABS
5.0000 mg | ORAL_TABLET | Freq: Once | ORAL | Status: AC
  Administered 2018-07-14: 5 mg via ORAL
  Filled 2018-07-14: qty 1

## 2018-07-14 MED ORDER — INSULIN ASPART 100 UNIT/ML ~~LOC~~ SOLN
10.0000 [IU] | Freq: Once | SUBCUTANEOUS | Status: AC
  Administered 2018-07-14: 10 [IU] via SUBCUTANEOUS

## 2018-07-14 NOTE — ED Provider Notes (Signed)
[facility] EMERGENCY DEPARTMENT Provider Note   CSN: [id number] Arrival date & time: 07/14/18  1031    History   Chief Complaint Chief Complaint  Patient presents with   Flank Pain    HPI Annlouise Gerety is a 58 y.o. female with history of asthma, diabetes type 2, fibromyalgia, hypertension, ventral hernia presenting to emergency department today with chief complaint of right flank pain x 1 week.  Patient states the pain is sharp and radiates to her groin.  The pain has been constant and she rates it 8 out of 10 in severity.  Patient takes oxycodone for her fibromyalgia and states that after taking it yesterday her pain eased off.  She not taken any of her morning medications today.  Patient states she saw her primary care doctor yesterday for this pain, and her UA showed blood.  Patient reports associated nausea without vomiting.  Abdominal surgical history includes ex lap.  Pt was seen x 1 week ago for similar pain.  Patient denies fever, chills, chest pain, shortness of breath, cough, diarrhea, gross hematuria.  Also denies any sick contacts.  History provided by patient.       Past Medical History:  Diagnosis Date   Anxiety    Asthma    Depression    Diabetes mellitus    Fibromyalgia    Heart attack ([place])    Hypertension    Morbid obesity ([place])    Neuropathy     Patient Active Problem List   Diagnosis Date Noted   DIABETES MELLITUS, TYPE II, WITHOUT COMPLICATIONS 03/47/4259   MORBID OBESITY 04/28/2008   DEPRESSION 04/28/2008   HYPERTENSION, ESSENTIAL 04/28/2008   ASTHMA, UNSPECIFIED, UNSPECIFIED STATUS 04/28/2008   GERD 04/28/2008   DEGENERATIVE JOINT DISEASE, BOTH KNEES, SEVERE 04/28/2008   SPONDYLOSIS, CERVICAL, WITHOUT MYELOPATHY 04/28/2008   MYOFASCIAL PAIN SYNDROME 04/28/2008   OBSTRUCTIVE SLEEP APNEA 05/02/2007    Past Surgical History:  Procedure Laterality Date   ABDOMINAL EXPLORATION SURGERY     CESAREAN  SECTION     UTERINE FIBROID SURGERY       OB History   No obstetric history on file.      Home Medications    Prior to Admission medications   Medication Sig Start Date End Date Taking? Authorizing Provider  albuterol (PROVENTIL HFA;VENTOLIN HFA) 108 (90 BASE) MCG/ACT inhaler Inhale 2 puffs into the lungs every 6 (six) hours as needed. For shortness of breath and wheezing    Yes [provider]  amLODipine-valsartan (EXFORGE) 10-320 MG tablet Take 1 tablet by mouth daily. 05/26/18  Yes [provider]  aspirin 325 MG EC tablet Take 325 mg by mouth daily.     Yes [provider]  beclomethasone (QVAR) 80 MCG/ACT inhaler Inhale 2 puffs into the lungs 2 (two) times daily.   Yes [provider]  clonazePAM (KLONOPIN) 0.5 MG tablet Take 0.5 mg by mouth 3 (three) times daily as needed for anxiety.   Yes [provider]  cyclobenzaprine (FLEXERIL) 10 MG tablet Take 1 tablet (10 mg total) by mouth 2 (two) times daily as needed for muscle spasms. 08/30/16  Yes Drenda Freeze, MD  fluticasone [facility]) 50 MCG/ACT nasal spray Place 2 sprays into both nostrils daily. 04/02/18  Yes Horton, Barbette Hair, MD  Fluticasone-Salmeterol (ADVAIR) 500-50 MCG/DOSE AEPB Inhale 1 puff into the lungs every 12 (twelve) hours.     Yes [provider]  insulin aspart (NOVOLOG) 100 UNIT/ML injection Inject 10 Units into  the skin 4 (four) times daily as needed for high blood sugar (sliding scale). 09/02/15  Yes Fredia Sorrow, MD  LORazepam (ATIVAN) 0.5 MG tablet Take 0.5 mg by mouth 2 (two) times daily as needed for anxiety. 05/02/18  Yes [provider]  NOVOLOG MIX 70/30 FLEXPEN (70-30) 100 UNIT/ML FlexPen Inject 75 Units into the skin 2 (two) times daily. 03/13/18  Yes [provider]  omeprazole (PRILOSEC) 40 MG capsule Take 40 mg by mouth daily.   Yes [provider]  oxyCODONE-acetaminophen (PERCOCET) 10-325 MG tablet Take 1 tablet by  mouth every 6 (six) hours as needed for pain. 06/28/18  Yes [provider]  sodium chloride (OCEAN) 0.65 % SOLN nasal spray Place 1 spray into both nostrils as needed for congestion. 04/02/18  Yes Horton, Barbette Hair, MD  dicyclomine (BENTYL) 20 MG tablet Take 1 tablet (20 mg total) by mouth 2 (two) times daily. 07/07/18   Law, Bea Graff, PA-C  guaiFENesin-codeine 100-10 MG/5ML syrup Take 10 mLs by mouth every 6 (six) hours as needed for cough. Patient not taking: Reported on 07/14/2018 06/20/16   Ward, Delice Bison, DO  HYDROcodone-acetaminophen (NORCO/VICODIN) 5-325 MG tablet Take 1 tablet by mouth every 6 (six) hours as needed for severe pain. 07/07/18   Law, Bea Graff, PA-C  Insulin Glargine (TOUJEO SOLOSTAR) 300 UNIT/ML SOPN Inject 90 Units into the skin 2 (two) times daily. Patient not taking: Reported on 07/14/2018 01/29/16   Recardo Evangelist, PA-C  methocarbamol (ROBAXIN) 500 MG tablet Take 1 tablet (500 mg total) by mouth every 12 (twelve) hours as needed for muscle spasms. 07/14/18   Demarion Pondexter E, PA-C  ondansetron (ZOFRAN) 4 MG tablet Take 1 tablet (4 mg total) by mouth every 6 (six) hours. 07/07/18   Law, Bea Graff, PA-C  traMADol (ULTRAM) 50 MG tablet Take 1 tablet (50 mg total) by mouth every 6 (six) hours as needed. Patient not taking: Reported on 07/14/2018 09/02/15   Fredia Sorrow, MD    Family History No family history on file.  Social History Social History   Tobacco Use   Smoking status: Never Smoker   Smokeless tobacco: Never Used  Substance Use Topics   Alcohol use: No   Drug use: No     Allergies   Other; Acetaminophen; Ibuprofen; Latex; and Omnipaque [iohexol]   Review of Systems Review of Systems  Constitutional: Negative for chills and fever.  HENT: Negative for congestion, ear discharge, ear pain, sinus pressure, sinus pain and sore throat.   Eyes: Negative for pain and redness.  Respiratory: Negative for cough and shortness of breath.     Cardiovascular: Negative for chest pain.  Gastrointestinal: Positive for nausea. Negative for abdominal pain, constipation, diarrhea and vomiting.  Genitourinary: Positive for flank pain. Negative for dysuria and hematuria.  Musculoskeletal: Negative for back pain and neck pain.  Skin: Negative for wound.  Neurological: Negative for weakness, numbness and headaches.     Physical Exam Updated Vital Signs BP (!) 174/99 (BP Location: Right Arm)    Pulse 88    Temp 98.1 F (36.7 C) (Oral)    Resp 18    SpO2 96%   Physical Exam Vitals signs and nursing note reviewed.  Constitutional:      General: She is not in acute distress.    Appearance: She is well-developed. She is obese. She is not toxic-appearing.  HENT:     Head: Normocephalic and atraumatic.     Nose: Nose normal.  Mouth/Throat:     Mouth: Mucous membranes are moist.     Pharynx: Oropharynx is clear.  Eyes:     General: No scleral icterus.       Right eye: No discharge.        Left eye: No discharge.     Extraocular Movements: Extraocular movements intact.     Conjunctiva/sclera: Conjunctivae normal.     Pupils: Pupils are equal, round, and reactive to light.  Neck:     Musculoskeletal: Normal range of motion. No muscular tenderness.  Cardiovascular:     Rate and Rhythm: Normal rate and regular rhythm.     Pulses: Normal pulses.          Radial pulses are 2+ on the right side and 2+ on the left side.       Dorsalis pedis pulses are 2+ on the right side and 2+ on the left side.     Heart sounds: Normal heart sounds.  Pulmonary:     Effort: Pulmonary effort is normal. No respiratory distress.     Breath sounds: Normal breath sounds.  Abdominal:     General: Bowel sounds are normal. There is no distension.     Palpations: Abdomen is soft.     Tenderness: There is no abdominal tenderness. There is right CVA tenderness. There is no left CVA tenderness, guarding or rebound. Negative signs include Murphy's sign,  Rovsing's sign and McBurney's sign.     Comments: No peritoneal signs  Musculoskeletal: Normal range of motion.     Right lower leg: No edema.     Left lower leg: No edema.     Comments: Full range of motion of cervical, thoracic, lumbar, sacral spine with flexion, hypertension, and lateral flexion. No midline tenderness of step offs. No tenderness to palpation of spinous processes of thoracic or lumbar spine. Tenderness to palpation to of right paraspinous muscles of right lumbar spine. Negative straight leg raise test bilaterally  Skin:    General: Skin is warm and dry.  Neurological:     Mental Status: She is oriented to person, place, and time.     Comments: Fluent speech, no facial droop.  Psychiatric:        Behavior: Behavior normal.      ED Treatments / Results  Labs (all labs ordered are listed, but only abnormal results are displayed) Labs Reviewed  URINALYSIS, ROUTINE W REFLEX MICROSCOPIC - Abnormal; Notable for the following components:      Result Value   APPearance HAZY (*)    Glucose, UA >=500 (*)    Hgb urine dipstick SMALL (*)    Protein, ur 100 (*)    Bacteria, UA RARE (*)    All other components within normal limits  CBC WITH DIFFERENTIAL/PLATELET - Abnormal; Notable for the following components:   Hemoglobin 11.9 (*)    All other components within normal limits  COMPREHENSIVE METABOLIC PANEL - Abnormal; Notable for the following components:   Glucose, Bld 514 (*)    Creatinine, Ser 1.26 (*)    Albumin 3.4 (*)    GFR calc non Af Amer 48 (*)    GFR calc Af Amer 56 (*)    All other components within normal limits  LIPASE, BLOOD - Abnormal; Notable for the following components:   Lipase 84 (*)    All other components within normal limits  CBG MONITORING, ED - Abnormal; Notable for the following components:   Glucose-Capillary 425 (*)    All other  components within normal limits  I-STAT BETA HCG BLOOD, ED ([facility], [facility], [facility] ONLY) - Abnormal; Notable for the  following components:   I-stat hCG, quantitative 7.4 (*)    All other components within normal limits  CBG MONITORING, ED - Abnormal; Notable for the following components:   Glucose-Capillary 341 (*)    All other components within normal limits  CBG MONITORING, ED - Abnormal; Notable for the following components:   Glucose-Capillary 298 (*)    All other components within normal limits  PREGNANCY, URINE    EKG None  Radiology Ct Abdomen Pelvis Wo Contrast  Result Date: 07/14/2018 CLINICAL DATA:  Right flank pain for 1 week.  Hematuria today. EXAM: CT ABDOMEN AND PELVIS WITHOUT CONTRAST TECHNIQUE: Multidetector CT imaging of the abdomen and pelvis was performed following the standard protocol without IV contrast. COMPARISON:  CT abdomen and pelvis 07/07/2018 and 08/15/2017. FINDINGS: Lower chest: Mild dependent atelectasis noted. No pleural or pericardial effusion. Hepatobiliary: There is fatty infiltration of the liver. No focal lesion. Gallbladder and biliary tree appear normal. Pancreas: Unremarkable. No pancreatic ductal dilatation or surrounding inflammatory changes. Spleen: Normal in size without focal abnormality. Adrenals/Urinary Tract: Adrenal glands are unremarkable. Kidneys are normal, without renal calculi, focal lesion, or hydronephrosis. Bladder is unremarkable. Stomach/Bowel: Stomach is within normal limits. Appendix appears normal. No evidence of bowel wall thickening, distention, or inflammatory changes. Vascular/Lymphatic: No significant vascular findings are present. No enlarged abdominal or pelvic lymph nodes. Reproductive: Bulky fibroid uterus is again seen. No adnexal mass is identified. Other: None. Musculoskeletal: Negative. IMPRESSION: Negative for urinary tract stone. No acute abnormality abdomen or pelvis. Fibroid uterus. Fatty infiltration of the liver. Electronically Signed   By: Inge Rise M.D.   On: 07/14/2018 15:33    Procedures Procedures (including critical  care time)  Medications Ordered in ED Medications  ondansetron (ZOFRAN) injection 4 mg (4 mg Intravenous Given 07/14/18 1315)  sodium chloride 0.9 % bolus 1,000 mL (0 mLs Intravenous Stopped 07/14/18 1528)  oxyCODONE (Oxy IR/ROXICODONE) immediate release tablet 5 mg (5 mg Oral Given 07/14/18 1131)  insulin aspart (novoLOG) injection 10 Units (10 Units Subcutaneous Given 07/14/18 1345)  diphenhydrAMINE (BENADRYL) injection 50 mg (50 mg Intravenous Given 07/14/18 1334)  morphine 4 MG/ML injection 4 mg (4 mg Intravenous Given 07/14/18 1504)  sodium chloride 0.9 % bolus 1,000 mL (0 mLs Intravenous Stopped 07/14/18 1641)  morphine 4 MG/ML injection 4 mg (4 mg Intravenous Given 07/14/18 1639)     Initial Impression / Assessment and Plan / ED Course  I have reviewed the triage vital signs and the nursing notes.  Pertinent labs & imaging results that were available during my care of the patient were reviewed by me and considered in my medical decision making (see chart for details).   Pt's presents with right flank pain that radiates to her groin. She is afebrile and non toxic appearing. She appears uncomfortable secondary to pain. Patient was seen x7 days ago in ED for abdominal pain after eating questionable food.  She was thought to have food poisoning.  Looking through EMR patient had CT abdomen pelvis at that visit which was negative. Her UA showed small blood. She went to pcp yesterday for follow up and again reports she had a UA that showed blood.   Today her UA shows the same small blood. She denies any urinary symptoms and history of kidney stones.  Patient's labs today are significant for elevated lipase of 84.  CMP shows glucose of  514 without an elevated anion gap making DKA less likely.  UA shows glucose >500 and small hemoglobin, negative ketones.  Patient given 10 units subcutaneous insulin and 2 L of fluids.  Her sugar improved to 298.  Her CBC is unremarkable, without leukocytosis. Given her  elevated lipase, unknown cause for pain will repeat CT abdomen. Pt's I-stat beta hCG was elevated, however her urine pregnancy was negative. Pt is not pregnant.  CT abdomen pelvis viewed by me does not show obvious cause for her abdominal pain.  Her gallbladder, pancreas, kidneys, stomach are all unremarkable. No kidney stones seen. She does have fatty liver and uterine fibroids. Patient is not tender in epigastric region and she denies nausea and vomiting. Pt denies alcohol use, we do not feel pancreatitis is the cause of her pain given her physical exam findings and negative imaging.  Patient was given home dose of oxycodone for pain and she reports minimal improvement.  Attempt for pain control with IV morphine.  When reassessing patient's pain has improved and abdomen is soft and non tender. No peritoneal signs. Given that pt sees a pain clinic for fibromyalgia, will not discharge home with prescription for pain medication. Recommend pt have her blood pressure rechecked when she follows up with pcp, no signs of hypertensive emergency at  this time.  Patient is hemodynamically stable, in NAD, and able to ambulate in the ED. Evaluation does not show pathology that would require ongoing emergent intervention or inpatient treatment. I explained the diagnosis to the patient. Pain has been managed and has no complaints prior to discharge. Patient is comfortable with above plan and is stable for discharge at this time. All questions were answered prior to disposition. Strict return precautions for returning to the ED were discussed .Pt will be discharged home with Robaxin and recommend pcp follow up. The patient was discussed with and seen by Dr. Jeanell Sparrow who agrees with the treatment plan.  Encouraged follow up with PCP in 2-5 days.  This note was prepared with assistance of Systems analyst. Occasional wrong-word or sound-a-like substitutions may have occurred due to the inherent limitations of  voice recognition software.    Final Clinical Impressions(s) / ED Diagnoses   Final diagnoses:  Right flank pain    ED Discharge Orders         Ordered    methocarbamol (ROBAXIN) 500 MG tablet  Every 12 hours PRN     07/14/18 1649           Nechuma Boven, Harley Hallmark, PA-C 07/15/18 0715    Pattricia Boss, MD 07/15/18 1325

## 2018-07-14 NOTE — ED Notes (Signed)
Patient transported to CT 

## 2018-07-14 NOTE — ED Triage Notes (Signed)
EMS stated, rt. Flank pain for a week. She stated I had food poison a week ago . Blood in urine as of yesterday. Went to her Dr.?

## 2018-07-14 NOTE — ED Notes (Signed)
Patient verbalizes understanding of discharge instructions . Opportunity for questions and answers were provided . Armband removed by staff ,Pt discharged from ED. W/C  offered at D/C  and Declined W/C at D/C and was escorted to lobby by RN.  

## 2018-07-14 NOTE — Discharge Instructions (Addendum)
.  You have been seen today for right side pain. Please read and follow all provided instructions. Return to the emergency room for worsening condition or new concerning symptoms.    1. Medications:  Prescription sent to your pharmacy for Robaxin. Robaxin: Robaxin is a muscle relaxer and can help relieve stiff muscles or muscle spasms.  Do not drive or perform other dangerous activities while taking the Robaxin. Continue usual home medications. Take medications as prescribed. Please review all of the medicines and only take them if you do not have an allergy to them.   2. Treatment: rest, drink plenty of fluids.  3. Follow Up: Please follow up with your primary doctor in 2-5 days for discussion of your diagnoses and further evaluation after today's visit; Call today to arrange your follow up.    It is also a possibility that you have an allergic reaction to any of the medicines that you have been prescribed - Everybody reacts differently to medications and while MOST people have no trouble with most medicines, you may have a reaction such as nausea, vomiting, rash, swelling, shortness of breath. If this is the case, please stop taking the medicine immediately and contact your physician.  ?

## 2018-07-15 LAB — COMPREHENSIVE METABOLIC PANEL
ALT: 13 U/L (ref 0–44)
AST: 16 U/L (ref 15–41)
Albumin: 3.4 g/dL — ABNORMAL LOW (ref 3.5–5.0)
Alkaline Phosphatase: 77 U/L (ref 38–126)
Anion gap: 10 (ref 5–15)
BUN: 18 mg/dL (ref 6–20)
CO2: 24 mmol/L (ref 22–32)
Calcium: 9 mg/dL (ref 8.9–10.3)
Chloride: 101 mmol/L (ref 98–111)
Creatinine, Ser: 1.26 mg/dL — ABNORMAL HIGH (ref 0.44–1.00)
GFR calc non Af Amer: 48 mL/min — ABNORMAL LOW (ref >60)
GFR, EST AFRICAN AMERICAN: 56 mL/min — AB (ref >60)
Glucose, Bld: 514 mg/dL (ref 70–99)
Potassium: 3.8 mmol/L (ref 3.5–5.1)
Sodium: 135 mmol/L (ref 135–145)
TOTAL PROTEIN: 7.2 g/dL (ref 6.5–8.1)
Total Bilirubin: 0.4 mg/dL (ref 0.3–1.2)

## 2018-08-08 ENCOUNTER — Ambulatory Visit: Payer: Self-pay | Admitting: Allergy and Immunology

## 2018-12-12 ENCOUNTER — Ambulatory Visit: Payer: Medicaid Other | Admitting: Allergy

## 2018-12-19 ENCOUNTER — Other Ambulatory Visit: Payer: Self-pay

## 2018-12-19 ENCOUNTER — Encounter (HOSPITAL_BASED_OUTPATIENT_CLINIC_OR_DEPARTMENT_OTHER): Payer: Self-pay

## 2018-12-19 ENCOUNTER — Emergency Department (HOSPITAL_BASED_OUTPATIENT_CLINIC_OR_DEPARTMENT_OTHER)
Admission: EM | Admit: 2018-12-19 | Discharge: 2018-12-19 | Disposition: A | Discharge status: Home / Self Care | Payer: Medicaid Other | Attending: Emergency Medicine | Admitting: Emergency Medicine

## 2018-12-19 DIAGNOSIS — Z7982 Long term (current) use of aspirin: Secondary | ICD-10-CM | POA: Diagnosis not present

## 2018-12-19 DIAGNOSIS — Z886 Allergy status to analgesic agent status: Secondary | ICD-10-CM | POA: Insufficient documentation

## 2018-12-19 DIAGNOSIS — M797 Fibromyalgia: Secondary | ICD-10-CM | POA: Insufficient documentation

## 2018-12-19 DIAGNOSIS — Z888 Allergy status to other drugs, medicaments and biological substances status: Secondary | ICD-10-CM | POA: Insufficient documentation

## 2018-12-19 DIAGNOSIS — Z79899 Other long term (current) drug therapy: Secondary | ICD-10-CM | POA: Insufficient documentation

## 2018-12-19 DIAGNOSIS — I252 Old myocardial infarction: Secondary | ICD-10-CM | POA: Diagnosis not present

## 2018-12-19 DIAGNOSIS — Z794 Long term (current) use of insulin: Secondary | ICD-10-CM | POA: Insufficient documentation

## 2018-12-19 DIAGNOSIS — Z91040 Latex allergy status: Secondary | ICD-10-CM | POA: Diagnosis not present

## 2018-12-19 DIAGNOSIS — R739 Hyperglycemia, unspecified: Secondary | ICD-10-CM

## 2018-12-19 DIAGNOSIS — M3210 Systemic lupus erythematosus, organ or system involvement unspecified: Secondary | ICD-10-CM | POA: Insufficient documentation

## 2018-12-19 DIAGNOSIS — I10 Essential (primary) hypertension: Secondary | ICD-10-CM | POA: Diagnosis not present

## 2018-12-19 DIAGNOSIS — E1165 Type 2 diabetes mellitus with hyperglycemia: Secondary | ICD-10-CM | POA: Insufficient documentation

## 2018-12-19 DIAGNOSIS — J45909 Unspecified asthma, uncomplicated: Secondary | ICD-10-CM | POA: Insufficient documentation

## 2018-12-19 HISTORY — DX: Reserved for concepts with insufficient information to code with codable children: IMO0002

## 2018-12-19 HISTORY — DX: Systemic lupus erythematosus, unspecified: M32.9

## 2018-12-19 LAB — CBG MONITORING, ED
Glucose-Capillary: 502 mg/dL (ref 70–99)
Glucose-Capillary: 414 mg/dL — ABNORMAL HIGH (ref 70–99)

## 2018-12-19 LAB — CBC WITH DIFFERENTIAL/PLATELET
Abs Immature Granulocytes: 0.03 10*3/uL (ref 0.00–0.07)
Basophils Absolute: 0 10*3/uL (ref 0.0–0.1)
Basophils Relative: 0 %
Eosinophils Absolute: 0.1 10*3/uL (ref 0.0–0.5)
Eosinophils Relative: 1 %
HCT: 38.9 % (ref 36.0–46.0)
Hemoglobin: 13 g/dL (ref 12.0–15.0)
Immature Granulocytes: 0 %
Lymphocytes Relative: 35 %
Lymphs Abs: 3.2 10*3/uL (ref 0.7–4.0)
MCH: 27.2 pg (ref 26.0–34.0)
MCHC: 33.4 g/dL (ref 30.0–36.0)
MCV: 81.4 fL (ref 80.0–100.0)
Monocytes Absolute: 0.6 10*3/uL (ref 0.1–1.0)
Monocytes Relative: 6 %
Neutro Abs: 5.1 10*3/uL (ref 1.7–7.7)
Neutrophils Relative %: 58 %
Platelets: 266 10*3/uL (ref 150–400)
RBC: 4.78 MIL/uL (ref 3.87–5.11)
RDW: 11.9 % (ref 11.5–15.5)
WBC: 9 10*3/uL (ref 4.0–10.5)
nRBC: 0 % (ref 0.0–0.2)

## 2018-12-19 LAB — URINALYSIS, ROUTINE W REFLEX MICROSCOPIC
Bilirubin Urine: NEGATIVE
Glucose, UA: >=500 mg/dL — AB
Ketones, ur: NEGATIVE mg/dL
Leukocytes,Ua: NEGATIVE
Nitrite: NEGATIVE
Protein, ur: 30 mg/dL — AB
Specific Gravity, Urine: 1.015 (ref 1.005–1.030)
pH: 6 (ref 5.0–8.0)

## 2018-12-19 LAB — BASIC METABOLIC PANEL
Anion gap: 16 — ABNORMAL HIGH (ref 5–15)
BUN: 45 mg/dL — ABNORMAL HIGH (ref 6–20)
CO2: 24 mmol/L (ref 22–32)
Calcium: 9.1 mg/dL (ref 8.9–10.3)
Chloride: 89 mmol/L — ABNORMAL LOW (ref 98–111)
Creatinine, Ser: 1.61 mg/dL — ABNORMAL HIGH (ref 0.44–1.00)
GFR calc Af Amer: 41 mL/min — ABNORMAL LOW (ref >60)
GFR calc non Af Amer: 35 mL/min — ABNORMAL LOW (ref >60)
Glucose, Bld: 538 mg/dL (ref 70–99)
Potassium: 3.5 mmol/L (ref 3.5–5.1)
Sodium: 129 mmol/L — ABNORMAL LOW (ref 135–145)

## 2018-12-19 LAB — URINALYSIS, MICROSCOPIC (REFLEX)

## 2018-12-19 MED ORDER — SODIUM CHLORIDE 0.9 % IV BOLUS
2000.0000 mL | Freq: Once | INTRAVENOUS | Status: AC
  Administered 2018-12-19: 2000 mL via INTRAVENOUS

## 2018-12-19 MED ORDER — INSULIN ASPART 100 UNIT/ML ~~LOC~~ SOLN
15.0000 [IU] | Freq: Once | SUBCUTANEOUS | Status: AC
  Administered 2018-12-19: 19:00:00 15 [IU] via INTRAVENOUS
  Filled 2018-12-19: qty 1

## 2018-12-19 NOTE — ED Provider Notes (Signed)
[facility] EMERGENCY DEPARTMENT Provider Note   CSN: [id number] Arrival date & time: 12/19/18  1559     History   Chief Complaint Chief Complaint  Patient presents with  . Hyperglycemia    HPI Judy English is a 58 y.o. female.     58 yo F with a chief complaints of hyperglycemia.  Going on for about a week now but worsening over the past couple days.  Patient saw her family doctor today and was sent to the ED for further evaluation.  She denies chest pain shortness of breath cough congestion fever abdominal pain vomiting or diarrhea.  Denies noncompliance with her medicines.  Denies change in her diet.  Denies any wounds.  Denies dysuria increased urgency or hesitancy.  The history is provided by the patient.  Hyperglycemia Associated symptoms: polyuria   Associated symptoms: no chest pain, no dizziness, no dysuria, no fever, no nausea, no shortness of breath and no vomiting   Illness Severity:  Moderate Onset quality:  Gradual Duration:  1 week Timing:  Constant Progression:  Worsening Chronicity:  New Associated symptoms: no chest pain, no congestion, no fever, no headaches, no myalgias, no nausea, no rhinorrhea, no shortness of breath, no vomiting and no wheezing     Past Medical History:  Diagnosis Date  . Anxiety   . Asthma   . Depression   . Diabetes mellitus   . Fibromyalgia   . Heart attack ([place])   . Hypertension   . Lupus ([place])   . Morbid obesity ([place])   . Neuropathy     Patient Active Problem List   Diagnosis Date Noted  . DIABETES MELLITUS, TYPE II, WITHOUT COMPLICATIONS [id number]  . MORBID OBESITY 04/28/2008  . DEPRESSION 04/28/2008  . HYPERTENSION, ESSENTIAL 04/28/2008  . ASTHMA, UNSPECIFIED, UNSPECIFIED STATUS 04/28/2008  . GERD 04/28/2008  . DEGENERATIVE JOINT DISEASE, BOTH KNEES, SEVERE 04/28/2008  . SPONDYLOSIS, CERVICAL, WITHOUT MYELOPATHY 04/28/2008  . MYOFASCIAL PAIN SYNDROME 04/28/2008  . OBSTRUCTIVE SLEEP APNEA  05/02/2007    Past Surgical History:  Procedure Laterality Date  . ABDOMINAL EXPLORATION SURGERY    . CESAREAN SECTION    . UTERINE FIBROID SURGERY       OB History   No obstetric history on file.      Home Medications    Prior to Admission medications   Medication Sig Start Date End Date Taking? Authorizing Provider  albuterol (PROVENTIL HFA;VENTOLIN HFA) 108 (90 BASE) MCG/ACT inhaler Inhale 2 puffs into the lungs every 6 (six) hours as needed. For shortness of breath and wheezing     [provider]  amLODipine-valsartan (EXFORGE) 10-320 MG tablet Take 1 tablet by mouth daily. 05/26/18   [provider]  aspirin 325 MG EC tablet Take 325 mg by mouth daily.      [provider]  beclomethasone (QVAR) 80 MCG/ACT inhaler Inhale 2 puffs into the lungs 2 (two) times daily.    [provider]  clonazePAM (KLONOPIN) 0.5 MG tablet Take 0.5 mg by mouth 3 (three) times daily as needed for anxiety.    [provider]  cyclobenzaprine (FLEXERIL) 10 MG tablet Take 1 tablet (10 mg total) by mouth 2 (two) times daily as needed for muscle spasms. 08/30/16   Drenda Freeze, MD  dicyclomine (BENTYL) 20 MG tablet Take 1 tablet (20 mg total) by mouth 2 (two) times daily. 07/07/18   Law, Bea Graff, PA-C  fluticasone (FLONASE) 50 MCG/ACT nasal spray Place 2 sprays into both  nostrils daily. 04/02/18   Horton, Barbette Hair, MD  Fluticasone-Salmeterol (ADVAIR) 500-50 MCG/DOSE AEPB Inhale 1 puff into the lungs every 12 (twelve) hours.      [provider]  guaiFENesin-codeine 100-10 MG/5ML syrup Take 10 mLs by mouth every 6 (six) hours as needed for cough. Patient not taking: Reported on 07/14/2018 06/20/16   Ward, Delice Bison, DO  HYDROcodone-acetaminophen (NORCO/VICODIN) 5-325 MG tablet Take 1 tablet by mouth every 6 (six) hours as needed for severe pain. 07/07/18   Law, Bea Graff, PA-C  insulin aspart (NOVOLOG) 100 UNIT/ML injection Inject 10 Units into the  skin 4 (four) times daily as needed for high blood sugar (sliding scale). 09/02/15   Fredia Sorrow, MD  Insulin Glargine (TOUJEO SOLOSTAR) 300 UNIT/ML SOPN Inject 90 Units into the skin 2 (two) times daily. Patient not taking: Reported on 07/14/2018 01/29/16   Recardo Evangelist, PA-C  LORazepam (ATIVAN) 0.5 MG tablet Take 0.5 mg by mouth 2 (two) times daily as needed for anxiety. 05/02/18   [provider]  methocarbamol (ROBAXIN) 500 MG tablet Take 1 tablet (500 mg total) by mouth every 12 (twelve) hours as needed for muscle spasms. 07/14/18   Albrizze, Kaitlyn E, PA-C  NOVOLOG MIX 70/30 FLEXPEN (70-30) 100 UNIT/ML FlexPen Inject 75 Units into the skin 2 (two) times daily. 03/13/18   [provider]  omeprazole (PRILOSEC) 40 MG capsule Take 40 mg by mouth daily.    [provider]  ondansetron (ZOFRAN) 4 MG tablet Take 1 tablet (4 mg total) by mouth every 6 (six) hours. 07/07/18   Law, Bea Graff, PA-C  oxyCODONE-acetaminophen (PERCOCET) 10-325 MG tablet Take 1 tablet by mouth every 6 (six) hours as needed for pain. 06/28/18   [provider]  sodium chloride (OCEAN) 0.65 % SOLN nasal spray Place 1 spray into both nostrils as needed for congestion. 04/02/18   Horton, Barbette Hair, MD  traMADol (ULTRAM) 50 MG tablet Take 1 tablet (50 mg total) by mouth every 6 (six) hours as needed. Patient not taking: Reported on 07/14/2018 09/02/15   Fredia Sorrow, MD    Family History No family history on file.  Social History Social History   Tobacco Use  . Smoking status: Never Smoker  . Smokeless tobacco: Never Used  Substance Use Topics  . Alcohol use: No  . Drug use: No     Allergies   Other, Acetaminophen, Ibuprofen, Latex, and Omnipaque [iohexol]   Review of Systems Review of Systems  Constitutional: Negative for chills and fever.  HENT: Negative for congestion and rhinorrhea.   Eyes: Negative for redness and visual disturbance.  Respiratory: Negative for  shortness of breath and wheezing.   Cardiovascular: Negative for chest pain and palpitations.  Gastrointestinal: Negative for nausea and vomiting.  Endocrine: Positive for polyuria.  Genitourinary: Negative for dysuria and urgency.  Musculoskeletal: Negative for arthralgias and myalgias.  Skin: Negative for pallor and wound.  Neurological: Negative for dizziness and headaches.     Physical Exam Updated Vital Signs BP (!) 153/95 (BP Location: Right Arm)   Pulse 99   Temp 98.5 F (36.9 C) (Oral)   Resp 18   Wt 127.4 kg   SpO2 99%   BMI 43.99 kg/m   Physical Exam Vitals signs and nursing note reviewed.  Constitutional:      General: She is not in acute distress.    Appearance: She is well-developed. She is not diaphoretic.  HENT:     Head: Normocephalic and atraumatic.  Eyes:     Pupils: Pupils are equal, round, and reactive to light.  Neck:     Musculoskeletal: Normal range of motion and neck supple.  Cardiovascular:     Rate and Rhythm: Normal rate and regular rhythm.     Heart sounds: No murmur. No friction rub. No gallop.   Pulmonary:     Effort: Pulmonary effort is normal.     Breath sounds: No wheezing or rales.  Abdominal:     General: There is no distension.     Palpations: Abdomen is soft.     Tenderness: There is no abdominal tenderness.  Musculoskeletal:        General: No tenderness.  Skin:    General: Skin is warm and dry.  Neurological:     Mental Status: She is alert and oriented to person, place, and time.  Psychiatric:        Behavior: Behavior normal.      ED Treatments / Results  Labs (all labs ordered are listed, but only abnormal results are displayed) Labs Reviewed  BASIC METABOLIC PANEL - Abnormal; Notable for the following components:      Result Value   Sodium 129 (*)    Chloride 89 (*)    Glucose, Bld 538 (*)    BUN 45 (*)    Creatinine, Ser 1.61 (*)    GFR calc non Af Amer 35 (*)    GFR calc Af Amer 41 (*)    Anion gap 16  (*)    All other components within normal limits  URINALYSIS, ROUTINE W REFLEX MICROSCOPIC - Abnormal; Notable for the following components:   Glucose, UA >=500 (*)    Hgb urine dipstick SMALL (*)    Protein, ur 30 (*)    All other components within normal limits  URINALYSIS, MICROSCOPIC (REFLEX) - Abnormal; Notable for the following components:   Bacteria, UA MANY (*)    All other components within normal limits  CBG MONITORING, ED - Abnormal; Notable for the following components:   Glucose-Capillary 502 (*)    All other components within normal limits  CBG MONITORING, ED - Abnormal; Notable for the following components:   Glucose-Capillary 414 (*)    All other components within normal limits  CBC WITH DIFFERENTIAL/PLATELET    EKG None  Radiology No results found.  Procedures Procedures (including critical care time)  Medications Ordered in ED Medications  insulin aspart (novoLOG) injection 15 Units (has no administration in time range)  sodium chloride 0.9 % bolus 2,000 mL ( Intravenous Stopped 12/19/18 1903)     Initial Impression / Assessment and Plan / ED Course  I have reviewed the triage vital signs and the nursing notes.  Pertinent labs & imaging results that were available during my care of the patient were reviewed by me and considered in my medical decision making (see chart for details).        58 yo F with a chief complaint of hyperglycemia.  Otherwise has no significant symptoms.  Blood sugar here is 502.  We will give 2 L of IV fluids follow check lab work to evaluate for [facility] or DKA.   Lab work with hyperglycemia without acidosis.  There is a very trace anion gap elevation.  Her urine is negative for ketones.  This is unlikely to be HHS or DKA.  Patient is well-appearing and nontoxic.  Her blood sugar came down to the low 400s with IV fluids.  We will give 1 bolus  dose of insulin and discharge home.  PCP follow-up.  7:19 PM:  I have discussed the  diagnosis/risks/treatment options with the patient and believe the pt to be eligible for discharge home to follow-up with PCP. We also discussed returning to the ED immediately if new or worsening sx occur. We discussed the sx which are most concerning (e.g., sudden worsening pain, fever, inability to tolerate by mouth) that necessitate immediate return. Medications administered to the patient during their visit and any new prescriptions provided to the patient are listed below.  Medications given during this visit Medications  insulin aspart (novoLOG) injection 15 Units (has no administration in time range)  sodium chloride 0.9 % bolus 2,000 mL ( Intravenous Stopped 12/19/18 1903)     The patient appears reasonably screen and/or stabilized for discharge and I doubt any other medical condition or other [facility] requiring further screening, evaluation, or treatment in the ED at this time prior to discharge.    Final Clinical Impressions(s) / ED Diagnoses   Final diagnoses:  Hyperglycemia    ED Discharge Orders    None       Deno Etienne, DO 12/19/18 1919

## 2018-12-19 NOTE — ED Notes (Signed)
Date and time results received: 12/19/18 510   Test: glucose Critical Value: 538  Name of Provider Notified: Tyrone Nine Orders Received? Or Actions Taken?: no orders given

## 2018-12-19 NOTE — Discharge Instructions (Signed)
Your lab work did not show a diabetic emergency.  Please take your medicine as prescribed and call your physician in the morning to see if they want to change your medications.

## 2018-12-19 NOTE — ED Triage Notes (Signed)
Pt c/o elevated BS, blurred vision, fatigue x 1+week-pt states she was sent from PCP-BS at office 507-pt NAD-to triage in w/c

## 2018-12-27 ENCOUNTER — Ambulatory Visit: Payer: Medicaid Other | Admitting: Allergy

## 2019-01-03 ENCOUNTER — Encounter (HOSPITAL_COMMUNITY): Payer: Self-pay | Admitting: Emergency Medicine

## 2019-01-03 ENCOUNTER — Emergency Department (HOSPITAL_COMMUNITY)
Admission: EM | Admit: 2019-01-03 | Discharge: 2019-01-03 | Disposition: A | Discharge status: Home / Self Care | Payer: Medicaid Other | Attending: Emergency Medicine | Admitting: Emergency Medicine

## 2019-01-03 ENCOUNTER — Other Ambulatory Visit: Payer: Self-pay

## 2019-01-03 ENCOUNTER — Emergency Department (HOSPITAL_COMMUNITY): Payer: Medicaid Other

## 2019-01-03 DIAGNOSIS — Z794 Long term (current) use of insulin: Secondary | ICD-10-CM | POA: Diagnosis not present

## 2019-01-03 DIAGNOSIS — E119 Type 2 diabetes mellitus without complications: Secondary | ICD-10-CM | POA: Diagnosis not present

## 2019-01-03 DIAGNOSIS — R079 Chest pain, unspecified: Secondary | ICD-10-CM | POA: Diagnosis not present

## 2019-01-03 DIAGNOSIS — Z7982 Long term (current) use of aspirin: Secondary | ICD-10-CM | POA: Diagnosis not present

## 2019-01-03 DIAGNOSIS — Z79899 Other long term (current) drug therapy: Secondary | ICD-10-CM | POA: Insufficient documentation

## 2019-01-03 DIAGNOSIS — R0602 Shortness of breath: Secondary | ICD-10-CM | POA: Insufficient documentation

## 2019-01-03 DIAGNOSIS — I10 Essential (primary) hypertension: Secondary | ICD-10-CM | POA: Insufficient documentation

## 2019-01-03 DIAGNOSIS — R2243 Localized swelling, mass and lump, lower limb, bilateral: Secondary | ICD-10-CM | POA: Diagnosis present

## 2019-01-03 DIAGNOSIS — Z91040 Latex allergy status: Secondary | ICD-10-CM | POA: Diagnosis not present

## 2019-01-03 DIAGNOSIS — R5383 Other fatigue: Secondary | ICD-10-CM | POA: Diagnosis not present

## 2019-01-03 DIAGNOSIS — M7989 Other specified soft tissue disorders: Secondary | ICD-10-CM

## 2019-01-03 LAB — CBC WITH DIFFERENTIAL/PLATELET
Abs Immature Granulocytes: 0.01 10*3/uL (ref 0.00–0.07)
Basophils Absolute: 0 10*3/uL (ref 0.0–0.1)
Basophils Relative: 1 %
Eosinophils Absolute: 0.2 10*3/uL (ref 0.0–0.5)
Eosinophils Relative: 3 %
HCT: 36.3 % (ref 36.0–46.0)
Hemoglobin: 11.8 g/dL — ABNORMAL LOW (ref 12.0–15.0)
Immature Granulocytes: 0 %
Lymphocytes Relative: 42 %
Lymphs Abs: 2.3 10*3/uL (ref 0.7–4.0)
MCH: 27.8 pg (ref 26.0–34.0)
MCHC: 32.5 g/dL (ref 30.0–36.0)
MCV: 85.4 fL (ref 80.0–100.0)
Monocytes Absolute: 0.4 10*3/uL (ref 0.1–1.0)
Monocytes Relative: 8 %
Neutro Abs: 2.6 10*3/uL (ref 1.7–7.7)
Neutrophils Relative %: 46 %
Platelets: 200 10*3/uL (ref 150–400)
RBC: 4.25 MIL/uL (ref 3.87–5.11)
RDW: 12 % (ref 11.5–15.5)
WBC: 5.5 10*3/uL (ref 4.0–10.5)
nRBC: 0 % (ref 0.0–0.2)

## 2019-01-03 LAB — URINALYSIS, ROUTINE W REFLEX MICROSCOPIC
Bilirubin Urine: NEGATIVE
Glucose, UA: 150 mg/dL — AB
Hgb urine dipstick: NEGATIVE
Ketones, ur: 5 mg/dL — AB
Nitrite: NEGATIVE
Protein, ur: 100 mg/dL — AB
Specific Gravity, Urine: 1.011 (ref 1.005–1.030)
pH: 6 (ref 5.0–8.0)

## 2019-01-03 LAB — BASIC METABOLIC PANEL
Anion gap: 10 (ref 5–15)
BUN: 20 mg/dL (ref 6–20)
CO2: 26 mmol/L (ref 22–32)
Calcium: 8.9 mg/dL (ref 8.9–10.3)
Chloride: 102 mmol/L (ref 98–111)
Creatinine, Ser: 1.46 mg/dL — ABNORMAL HIGH (ref 0.44–1.00)
GFR calc Af Amer: 46 mL/min — ABNORMAL LOW (ref >60)
GFR calc non Af Amer: 40 mL/min — ABNORMAL LOW (ref >60)
Glucose, Bld: 280 mg/dL — ABNORMAL HIGH (ref 70–99)
Potassium: 3.4 mmol/L — ABNORMAL LOW (ref 3.5–5.1)
Sodium: 138 mmol/L (ref 135–145)

## 2019-01-03 LAB — HEPATIC FUNCTION PANEL
ALT: 21 U/L (ref 0–44)
AST: 25 U/L (ref 15–41)
Albumin: 3.7 g/dL (ref 3.5–5.0)
Alkaline Phosphatase: 59 U/L (ref 38–126)
Bilirubin, Direct: 0.1 mg/dL (ref 0.0–0.2)
Indirect Bilirubin: 0.3 mg/dL (ref 0.3–0.9)
Total Bilirubin: 0.4 mg/dL (ref 0.3–1.2)
Total Protein: 7.3 g/dL (ref 6.5–8.1)

## 2019-01-03 LAB — TROPONIN I (HIGH SENSITIVITY)
Troponin I (High Sensitivity): 10 ng/L (ref <18)
Troponin I (High Sensitivity): 9 ng/L (ref <18)

## 2019-01-03 LAB — BRAIN NATRIURETIC PEPTIDE: B Natriuretic Peptide: 27.8 pg/mL (ref 0.0–100.0)

## 2019-01-03 LAB — D-DIMER, QUANTITATIVE: D-Dimer, Quant: 0.39 ug/mL-FEU (ref 0.00–0.50)

## 2019-01-03 NOTE — Discharge Instructions (Signed)
Please call your cardiologist to schedule an appointment for further evaluation of your leg swelling and chest pain I would ultimately like them to write the medication for you as they will need to monitor your potassium levels when you first start the medication

## 2019-01-03 NOTE — ED Provider Notes (Signed)
[place] EMERGENCY DEPARTMENT Provider Note   CSN: [id number] Arrival date & time: 01/03/19  0305     History   Chief Complaint Chief Complaint  Patient presents with   Feet Swelling    HPI Judy English is a 58 y.o. female with PMHx SLE, HTN, Diabetes, anxiety, depression, fibromyalgia who presents to the ED for multiple complaints.  She initially came into the ED complaining of bilateral feet swelling x 2 to 3 days.  She endorses pain with the swelling.  She also complains of generalized fatigue.  States that they recently increased her insulin to 4 times a day due to her blood sugars being greater than 600.  She states that they have been fluctuating in the past 2 weeks.  Patient states anywhere from 100s to 300s blood glucose level.  She denies it dropping below 100.  She also mentions that last night prior to coming to the ED she had intermittent, sudden onset, sharp, substernal chest pain with shortness of breath.  Patient states that she has had pain like this in the past and has been seen by cardiology with a negative work-up.  She is a never smoker.  Patient does have family history of CAD.  No history DVT/PE.  No recent prolonged travel or immobilization.  No estrogen therapy.  No hemoptysis.  No active malignancy.  Denies fever, chills, cough, abdominal pain, nausea, vomiting, diaphoresis, any other associated symptoms.    Per chart review: Cardiac catheterization 09/12/2016 with normal coronary arteries Nuclear stress test 09/09/2016 with EF 62%      The history is provided by the patient and a relative.    Past Medical History:  Diagnosis Date   Anxiety    Asthma    Depression    Diabetes mellitus    Fibromyalgia    Heart attack ([place])    Hypertension    Lupus ([place])    Morbid obesity ([place])    Neuropathy     Patient Active Problem List   Diagnosis Date Noted   DIABETES MELLITUS, TYPE II, WITHOUT COMPLICATIONS [id number]    MORBID OBESITY 04/28/2008   DEPRESSION 04/28/2008   HYPERTENSION, ESSENTIAL 04/28/2008   ASTHMA, UNSPECIFIED, UNSPECIFIED STATUS 04/28/2008   GERD 04/28/2008   DEGENERATIVE JOINT DISEASE, BOTH KNEES, SEVERE 04/28/2008   SPONDYLOSIS, CERVICAL, WITHOUT MYELOPATHY 04/28/2008   MYOFASCIAL PAIN SYNDROME 04/28/2008   OBSTRUCTIVE SLEEP APNEA 05/02/2007    Past Surgical History:  Procedure Laterality Date   ABDOMINAL EXPLORATION SURGERY     CESAREAN SECTION     UTERINE FIBROID SURGERY       OB History   No obstetric history on file.      Home Medications    Prior to Admission medications   Medication Sig Start Date End Date Taking? Authorizing Provider  albuterol (PROVENTIL HFA;VENTOLIN HFA) 108 (90 BASE) MCG/ACT inhaler Inhale 2 puffs into the lungs every 6 (six) hours as needed. For shortness of breath and wheezing    Yes [provider]  amLODipine (NORVASC) 5 MG tablet Take 5 mg by mouth daily. 11/29/18  Yes [provider]  aspirin 325 MG EC tablet Take 325 mg by mouth daily.     Yes [provider]  buPROPion (WELLBUTRIN XL) 150 MG 24 hr tablet Take 150 mg by mouth 3 (three) times daily as needed. 11/29/18  Yes [provider]  BYDUREON 2 MG PEN Inject 2 mg as directed once a week. 11/29/18  Yes [provider]  chlorthalidone (HYGROTON) 25 MG tablet Take 50 mg by mouth daily. 11/29/18  Yes [provider]  clonazePAM (KLONOPIN) 0.5 MG tablet Take 0.5 mg by mouth 3 (three) times daily as needed for anxiety.   Yes [provider]  cyclobenzaprine (FLEXERIL) 10 MG tablet Take 1 tablet (10 mg total) by mouth 2 (two) times daily as needed for muscle spasms. 08/30/16  Yes Drenda Freeze, MD  esomeprazole (NEXIUM) 40 MG capsule Take 40 mg by mouth 2 (two) times daily. 07/21/18  Yes [provider]  fluticasone (FLONASE) 50 MCG/ACT nasal spray Place 2 sprays into both nostrils daily. 04/02/18  Yes Horton, Barbette Hair, MD  Fluticasone-Salmeterol (ADVAIR) 500-50 MCG/DOSE AEPB Inhale 1 puff into the lungs every 12 (twelve) hours.     Yes [provider]  gabapentin (NEURONTIN) 600 MG tablet Take 1 tablet by mouth 3 (three) times daily. 11/29/18  Yes [provider]  insulin aspart (NOVOLOG) 100 UNIT/ML injection Inject 10 Units into the skin 4 (four) times daily as needed for high blood sugar (sliding scale). 09/02/15  Yes Fredia Sorrow, MD  JANUVIA 100 MG tablet Take 100 mg by mouth daily. 11/29/18  Yes [provider]  LORazepam (ATIVAN) 0.5 MG tablet Take 0.5 mg by mouth 2 (two) times daily as needed for anxiety. 05/02/18  Yes [provider]  omeprazole (PRILOSEC) 40 MG capsule Take 40 mg by mouth daily.   Yes [provider]  ondansetron (ZOFRAN) 4 MG tablet Take 1 tablet (4 mg total) by mouth every 6 (six) hours. 07/07/18  Yes Law, Bea Graff, PA-C  oxyCODONE-acetaminophen (PERCOCET) 10-325 MG tablet Take 1 tablet by mouth every 6 (six) hours as needed for pain. 06/28/18  Yes [provider]  dicyclomine (BENTYL) 20 MG tablet Take 1 tablet (20 mg total) by mouth 2 (two) times daily. Patient not taking: Reported on 01/03/2019 07/07/18   Frederica Kuster, PA-C  guaiFENesin-codeine 100-10 MG/5ML syrup Take 10 mLs by mouth every 6 (six) hours as needed for cough. Patient not taking: Reported on 01/03/2019 06/20/16   Ward, Delice Bison, DO  HYDROcodone-acetaminophen (NORCO/VICODIN) 5-325 MG tablet Take 1 tablet by mouth every 6 (six) hours as needed for severe pain. Patient not taking: Reported on 01/03/2019 07/07/18   Frederica Kuster, PA-C  Insulin Glargine (TOUJEO SOLOSTAR) 300 UNIT/ML SOPN Inject 90 Units into the skin 2 (two) times daily. Patient not taking: Reported on 07/14/2018 01/29/16   Recardo Evangelist, PA-C  methocarbamol (ROBAXIN) 500 MG tablet Take 1 tablet (500 mg total) by mouth every 12 (twelve) hours as needed for muscle spasms. Patient not taking:  Reported on 01/03/2019 07/14/18   Albrizze, Verline Lema E, PA-C  sodium chloride (OCEAN) 0.65 % SOLN nasal spray Place 1 spray into both nostrils as needed for congestion. Patient not taking: Reported on 01/03/2019 04/02/18   Horton, Barbette Hair, MD  traMADol (ULTRAM) 50 MG tablet Take 1 tablet (50 mg total) by mouth every 6 (six) hours as needed. Patient not taking: Reported on 07/14/2018 09/02/15   Fredia Sorrow, MD    Family History No family history on file.  Social History Social History   Tobacco Use   Smoking status: Never Smoker   Smokeless tobacco: Never Used  Substance Use Topics   Alcohol use: No   Drug use: No     Allergies   Other, Acetaminophen, Ibuprofen, Latex, and Omnipaque [iohexol]   Review of Systems Review of Systems  Constitutional: Positive for fatigue.  Negative for chills and fever.  HENT: Negative for congestion.   Eyes: Negative for visual disturbance.  Respiratory: Positive for shortness of breath.   Cardiovascular: Positive for chest pain and leg swelling.  Gastrointestinal: Negative for abdominal pain, nausea and vomiting.  Genitourinary: Negative for difficulty urinating.  Musculoskeletal: Positive for arthralgias. Negative for myalgias.  Skin: Negative for rash.  Neurological: Negative for headaches.     Physical Exam Updated Vital Signs BP (!) 145/98    Pulse 86    Temp 98 F (36.7 C) (Oral)    Resp 13    SpO2 98%   Physical Exam Vitals signs and nursing note reviewed.  Constitutional:      Appearance: She is obese. She is not ill-appearing.  HENT:     Head: Normocephalic and atraumatic.  Eyes:     Extraocular Movements: Extraocular movements intact.     Conjunctiva/sclera: Conjunctivae normal.  Neck:     Musculoskeletal: Neck supple.  Cardiovascular:     Rate and Rhythm: Normal rate and regular rhythm.     Pulses: Normal pulses.  Pulmonary:     Effort: Pulmonary effort is normal.     Breath sounds: Normal breath sounds. No  wheezing, rhonchi or rales.  Chest:     Chest wall: No tenderness.  Abdominal:     Palpations: Abdomen is soft.     Tenderness: There is no abdominal tenderness. There is no guarding or rebound.  Musculoskeletal:     Right lower leg: Edema present.     Left lower leg: Edema present.     Comments: 1+ pitting edema BLEs  Skin:    General: Skin is warm and dry.  Neurological:     Mental Status: She is alert.      ED Treatments / Results  Labs (all labs ordered are listed, but only abnormal results are displayed) Labs Reviewed  CBC WITH DIFFERENTIAL/PLATELET - Abnormal; Notable for the following components:      Result Value   Hemoglobin 11.8 (*)    All other components within normal limits  BASIC METABOLIC PANEL - Abnormal; Notable for the following components:   Potassium 3.4 (*)    Glucose, Bld 280 (*)    Creatinine, Ser 1.46 (*)    GFR calc non Af Amer 40 (*)    GFR calc Af Amer 46 (*)    All other components within normal limits  URINALYSIS, ROUTINE W REFLEX MICROSCOPIC - Abnormal; Notable for the following components:   APPearance HAZY (*)    Glucose, UA 150 (*)    Ketones, ur 5 (*)    Protein, ur 100 (*)    Leukocytes,Ua SMALL (*)    Bacteria, UA RARE (*)    All other components within normal limits  D-DIMER, QUANTITATIVE (NOT AT [facility])  HEPATIC FUNCTION PANEL  BRAIN NATRIURETIC PEPTIDE  TROPONIN I (HIGH SENSITIVITY)  TROPONIN I (HIGH SENSITIVITY)    EKG EKG Interpretation  Date/Time:  Thursday January 03 2019 07:42:59 EDT Ventricular Rate:  79 PR Interval:    QRS Duration: 101 QT Interval:  390 QTC Calculation: 448 R Axis:   -24 Text Interpretation:  Sinus rhythm Prolonged PR interval Borderline left axis deviation no acute ST/T changes similar to 2018 Confirmed by Sherwood Gambler [phone]) on 01/03/2019 8:23:40 AM   Radiology Dg Chest Port 1 View  Result Date: 01/03/2019 CLINICAL DATA:  Chest pain and shortness of breath EXAM: PORTABLE CHEST 1 VIEW  COMPARISON:  July 10, 2017 FINDINGS: There is  no edema or consolidation. Heart is upper normal in size with pulmonary vascularity normal. No adenopathy. No pneumothorax. No bone lesions. IMPRESSION: No edema or consolidation.  Heart upper normal in size. Electronically Signed   By: Lowella Grip III M.D.   On: 01/03/2019 09:30    Procedures Procedures (including critical care time)  Medications Ordered in ED Medications - No data to display   Initial Impression / Assessment and Plan / ED Course  I have reviewed the triage vital signs and the nursing notes.  Pertinent labs & imaging results that were available during my care of the patient were reviewed by me and considered in my medical decision making (see chart for details).  Clinical Course as of Jan 02 1149  Thu Jan 03, 2019  1145 Brain natriuretic peptide [MV]    Clinical Course User Index [MV] Eustaquio Maize, [place]   58 year old female who presents to the ED with complaints of bilateral feet swelling, generalized fatigue, and chest pain/SOB. Pt did not mention her chest pain during triage time and has been sitting in the waiting room for over 5 hours prior to being seen. EKG appears unchanged from previous. She has had recent cardiac cath and stress test done in 2018 with no acute findings. Pt reports generalized fatigue since having her insulin increased in the past 2 weeks - there was concern for noncompliance previously. Her glucose level in the ED is 280 with normal bicarb and no anion gap. Advised that she follow up with her PCP/endocrinologist regarding generalized fatigue and fluctuating glucose levels.   Concern feet swelling - no injury. No erythema or increased warmth to suggest cellulitis. Pt afebrile in the ED without tachycardia or tachypnea. She does have 1+ pitting edema bilaterally. Question CHF exacerbation vs protein insufficiency vs hepatic function issues. Doubt DVT at this time given bilaterality of swelling.  Given pt is having chest pain and SOB cannot PERC out with age; will obtain d dimer at this time. IF elevated may proceed to DVT study. Will work up for ACS at this time as well as obtain CXR and BNP.   D dimer negative. Initial trop of 10 and repeat trop of 9. No infection with urinalysis. No LFT abnormalities. BNP unremarkable.   Heart score of 4. Feel patient can be discharged at this time with close cardiology follow up. Have discussed with her that she will need to speak with cardiology in terms of medication for her feet swelling; do not feel comfortable starting patient on fluid medication at this time given kidney function and inability to follow up on potassium. Pt is in agreement with plan at this time and stable for discharge home. Strict return precautions discussed.        Final Clinical Impressions(s) / ED Diagnoses   Final diagnoses:  Nonspecific chest pain  Leg swelling    ED Discharge Orders    None       Eustaquio Maize, PA-C 01/03/19 [place], MD 01/04/19 [phone]

## 2019-01-03 NOTE — ED Triage Notes (Signed)
Patient reports bilateral feet swelling onset today , denies injury , patient added fatigue and generalized weakness, denies fever or chills .

## 2019-01-30 ENCOUNTER — Ambulatory Visit: Payer: Medicaid Other | Admitting: Neurology

## 2019-01-30 ENCOUNTER — Telehealth: Payer: Self-pay

## 2019-01-30 NOTE — Telephone Encounter (Signed)
Pt did not show for their appt with Dr. Athar today.  

## 2019-01-31 ENCOUNTER — Encounter: Payer: Self-pay | Admitting: Neurology

## 2019-02-23 ENCOUNTER — Other Ambulatory Visit: Payer: Self-pay

## 2019-02-23 ENCOUNTER — Encounter (HOSPITAL_BASED_OUTPATIENT_CLINIC_OR_DEPARTMENT_OTHER): Payer: Self-pay | Admitting: Emergency Medicine

## 2019-02-23 ENCOUNTER — Emergency Department (HOSPITAL_BASED_OUTPATIENT_CLINIC_OR_DEPARTMENT_OTHER)
Admission: EM | Admit: 2019-02-23 | Discharge: 2019-02-23 | Disposition: A | Discharge status: Home / Self Care | Payer: Medicaid Other | Attending: Emergency Medicine | Admitting: Emergency Medicine

## 2019-02-23 DIAGNOSIS — Z79899 Other long term (current) drug therapy: Secondary | ICD-10-CM | POA: Diagnosis not present

## 2019-02-23 DIAGNOSIS — I10 Essential (primary) hypertension: Secondary | ICD-10-CM | POA: Insufficient documentation

## 2019-02-23 DIAGNOSIS — J45909 Unspecified asthma, uncomplicated: Secondary | ICD-10-CM | POA: Diagnosis not present

## 2019-02-23 DIAGNOSIS — E1140 Type 2 diabetes mellitus with diabetic neuropathy, unspecified: Secondary | ICD-10-CM | POA: Diagnosis not present

## 2019-02-23 DIAGNOSIS — Z7982 Long term (current) use of aspirin: Secondary | ICD-10-CM | POA: Insufficient documentation

## 2019-02-23 DIAGNOSIS — Z794 Long term (current) use of insulin: Secondary | ICD-10-CM | POA: Diagnosis not present

## 2019-02-23 DIAGNOSIS — R040 Epistaxis: Secondary | ICD-10-CM | POA: Diagnosis not present

## 2019-02-23 MED ORDER — OXYMETAZOLINE HCL 0.05 % NA SOLN
2.0000 | Freq: Once | NASAL | Status: AC
  Administered 2019-02-23: 2 via NASAL
  Filled 2019-02-23: qty 30

## 2019-02-23 MED ORDER — SILVER NITRATE-POT NITRATE 75-25 % EX MISC
1.0000 | Freq: Once | CUTANEOUS | Status: AC
  Administered 2019-02-23: 02:00:00 1 via TOPICAL
  Filled 2019-02-23: qty 1

## 2019-02-23 NOTE — ED Triage Notes (Signed)
Pt said nosebleed began approx 45 mins ago while sleeping

## 2019-02-23 NOTE — ED Provider Notes (Signed)
[facility] EMERGENCY DEPARTMENT Provider Note   CSN: [id number] Arrival date & time: 02/23/19  0205     History   Chief Complaint Chief Complaint  Patient presents with  . Epistaxis    HPI Judy English is a 58 y.o. female.     The history is provided by the patient.  Epistaxis Location:  R nare Severity:  Mild Duration:  1 hour Timing:  Constant Progression:  Partially resolved Chronicity:  New Context: hypertension   Context: not anticoagulants, not aspirin use, not bleeding disorder and not CPAP   Relieved by:  Nothing Worsened by:  Nothing Ineffective treatments:  None tried Associated symptoms: no blood in oropharynx, no congestion, no cough, no dizziness, no facial pain, no fever, no headaches and no sinus pain   Risk factors: no alcohol use, no head and neck surgery and no head and neck tumor   Also having typical fibromyalgia pain in the arms.  No CP no SOB.  No n/v/d.  Pain is with movement and palpation.    Past Medical History:  Diagnosis Date  . Anxiety   . Asthma   . Depression   . Diabetes mellitus   . Fibromyalgia   . Heart attack ([place])   . Hypertension   . Lupus ([place])   . Morbid obesity ([place])   . Neuropathy     Patient Active Problem List   Diagnosis Date Noted  . DIABETES MELLITUS, TYPE II, WITHOUT COMPLICATIONS [id number]  . MORBID OBESITY 04/28/2008  . DEPRESSION 04/28/2008  . HYPERTENSION, ESSENTIAL 04/28/2008  . ASTHMA, UNSPECIFIED, UNSPECIFIED STATUS 04/28/2008  . GERD 04/28/2008  . DEGENERATIVE JOINT DISEASE, BOTH KNEES, SEVERE 04/28/2008  . SPONDYLOSIS, CERVICAL, WITHOUT MYELOPATHY 04/28/2008  . MYOFASCIAL PAIN SYNDROME 04/28/2008  . OBSTRUCTIVE SLEEP APNEA 05/02/2007    Past Surgical History:  Procedure Laterality Date  . ABDOMINAL EXPLORATION SURGERY    . CESAREAN SECTION    . UTERINE FIBROID SURGERY       OB History   No obstetric history on file.      Home Medications    Prior to Admission  medications   Medication Sig Start Date End Date Taking? Authorizing Provider  albuterol (PROVENTIL HFA;VENTOLIN HFA) 108 (90 BASE) MCG/ACT inhaler Inhale 2 puffs into the lungs every 6 (six) hours as needed. For shortness of breath and wheezing     [provider]  amLODipine (NORVASC) 5 MG tablet Take 5 mg by mouth daily. 11/29/18   [provider]  aspirin 325 MG EC tablet Take 325 mg by mouth daily.      [provider]  buPROPion (WELLBUTRIN XL) 150 MG 24 hr tablet Take 150 mg by mouth 3 (three) times daily as needed. 11/29/18   [provider]  BYDUREON 2 MG PEN Inject 2 mg as directed once a week. 11/29/18   [provider]  chlorthalidone (HYGROTON) 25 MG tablet Take 50 mg by mouth daily. 11/29/18   [provider]  clonazePAM (KLONOPIN) 0.5 MG tablet Take 0.5 mg by mouth 3 (three) times daily as needed for anxiety.    [provider]  cyclobenzaprine (FLEXERIL) 10 MG tablet Take 1 tablet (10 mg total) by mouth 2 (two) times daily as needed for muscle spasms. 08/30/16   Drenda Freeze, MD  dicyclomine (BENTYL) 20 MG tablet Take 1 tablet (20 mg total) by mouth 2 (two) times daily. Patient not taking: Reported on 01/03/2019 07/07/18   Frederica Kuster, PA-C  esomeprazole (  NEXIUM) 40 MG capsule Take 40 mg by mouth 2 (two) times daily. 07/21/18   [provider]  FLUoxetine (PROZAC) 40 MG capsule Take 40 mg by mouth daily.    [provider]  fluticasone (FLONASE) 50 MCG/ACT nasal spray Place 2 sprays into both nostrils daily. 04/02/18   Horton, Barbette Hair, MD  Fluticasone-Salmeterol (ADVAIR) 500-50 MCG/DOSE AEPB Inhale 1 puff into the lungs every 12 (twelve) hours.      [provider]  gabapentin (NEURONTIN) 600 MG tablet Take 1 tablet by mouth 3 (three) times daily. 11/29/18   [provider]  guaiFENesin-codeine 100-10 MG/5ML syrup Take 10 mLs by mouth every 6 (six) hours as needed for cough. Patient not  taking: Reported on 01/03/2019 06/20/16   Ward, Delice Bison, DO  HYDROcodone-acetaminophen (NORCO/VICODIN) 5-325 MG tablet Take 1 tablet by mouth every 6 (six) hours as needed for severe pain. Patient not taking: Reported on 01/03/2019 07/07/18   Frederica Kuster, PA-C  hydrOXYzine (ATARAX/VISTARIL) 25 MG tablet Take 25 mg by mouth 4 (four) times daily.    [provider]  insulin aspart (NOVOLOG) 100 UNIT/ML injection Inject 10 Units into the skin 4 (four) times daily as needed for high blood sugar (sliding scale). 09/02/15   Fredia Sorrow, MD  Insulin Glargine (TOUJEO SOLOSTAR) 300 UNIT/ML SOPN Inject 90 Units into the skin 2 (two) times daily. Patient not taking: Reported on 07/14/2018 01/29/16   Recardo Evangelist, PA-C  JANUVIA 100 MG tablet Take 100 mg by mouth daily. 11/29/18   [provider]  LORazepam (ATIVAN) 0.5 MG tablet Take 0.5 mg by mouth 2 (two) times daily as needed for anxiety. 05/02/18   [provider]  methocarbamol (ROBAXIN) 500 MG tablet Take 1 tablet (500 mg total) by mouth every 12 (twelve) hours as needed for muscle spasms. Patient not taking: Reported on 01/03/2019 07/14/18   Albrizze, Verline Lema E, PA-C  Nebivolol HCl (BYSTOLIC) 20 MG TABS Take by mouth 2 (two) times daily.    [provider]  omeprazole (PRILOSEC) 40 MG capsule Take 40 mg by mouth daily.    [provider]  ondansetron (ZOFRAN) 4 MG tablet Take 1 tablet (4 mg total) by mouth every 6 (six) hours. 07/07/18   Law, Bea Graff, PA-C  oxyCODONE-acetaminophen (PERCOCET) 10-325 MG tablet Take 1 tablet by mouth every 6 (six) hours as needed for pain. 06/28/18   [provider]  sodium chloride (OCEAN) 0.65 % SOLN nasal spray Place 1 spray into both nostrils as needed for congestion. Patient not taking: Reported on 01/03/2019 04/02/18   Horton, Barbette Hair, MD  tamsulosin (FLOMAX) 0.4 MG CAPS capsule Take 0.4 mg by mouth.    [provider]  traMADol (ULTRAM) 50 MG  tablet Take 1 tablet (50 mg total) by mouth every 6 (six) hours as needed. Patient not taking: Reported on 07/14/2018 09/02/15   Fredia Sorrow, MD    Family History No family history on file.  Social History Social History   Tobacco Use  . Smoking status: Never Smoker  . Smokeless tobacco: Never Used  Substance Use Topics  . Alcohol use: No  . Drug use: No     Allergies   Other, Acetaminophen, Ibuprofen, Latex, and Omnipaque [iohexol]   Review of Systems Review of Systems  Constitutional: Negative for fever.  HENT: Positive for nosebleeds. Negative for congestion and sinus pain.   Respiratory: Negative for cough.   Cardiovascular: Negative for chest pain.  Gastrointestinal: Negative for  abdominal distention.  Genitourinary: Negative for difficulty urinating.  Musculoskeletal: Positive for arthralgias.  Neurological: Negative for dizziness and headaches.  All other systems reviewed and are negative.    Physical Exam Updated Vital Signs BP (!) 178/87   Pulse 99   Temp 98.4 F (36.9 C) (Oral)   Resp 16   Ht 5\' 8"  (1.727 m)   Wt 124.3 kg   SpO2 99%   BMI 41.66 kg/m   Physical Exam Vitals signs and nursing note reviewed.  Constitutional:      General: She is not in acute distress.    Appearance: She is obese.  HENT:     Head: Normocephalic and atraumatic.     Nose: Nose normal. No congestion or rhinorrhea.     Comments: No epistaxis Eyes:     Pupils: Pupils are equal, round, and reactive to light.  Neck:     Musculoskeletal: Normal range of motion and neck supple.  Cardiovascular:     Rate and Rhythm: Normal rate and regular rhythm.     Pulses: Normal pulses.     Heart sounds: Normal heart sounds.  Pulmonary:     Effort: Pulmonary effort is normal.     Breath sounds: Normal breath sounds.  Abdominal:     General: Abdomen is flat. Bowel sounds are normal.     Tenderness: There is no abdominal tenderness.  Musculoskeletal: Normal range of motion.   Skin:    General: Skin is warm and dry.     Capillary Refill: Capillary refill takes less than 2 seconds.  Neurological:     General: No focal deficit present.     Mental Status: She is alert and oriented to person, place, and time.  Psychiatric:        Mood and Affect: Mood normal.        Behavior: Behavior normal.      ED Treatments / Results  Labs (all labs ordered are listed, but only abnormal results are displayed) Labs Reviewed - No data to display  EKG None  Radiology No results found.  Procedures Procedures (including critical care time)  Medications Ordered in ED Medications  oxymetazoline (AFRIN) 0.05 % nasal spray 2 spray (2 sprays Each Nare Given by Other 02/23/19 0226)  silver nitrate applicators applicator 1 Stick (1 Stick Topical Given 02/23/19 0226)     Initial Impression / Assessment and Plan / ED Course  No bleeding in the Ed. Observed.    Judy English was evaluated in Emergency Department on 02/23/2019 for the symptoms described in the history of present illness. She was evaluated in the context of the global COVID-19 pandemic, which necessitated consideration that the patient might be at risk for infection with the SARS-CoV-2 virus that causes COVID-19. Institutional protocols and algorithms that pertain to the evaluation of patients at risk for COVID-19 are in a state of rapid change based on information released by regulatory bodies including the CDC and federal and state organizations. These policies and algorithms were followed during the patient's care in the ED.   Final Clinical Impressions(s) / ED Diagnoses   Final diagnoses:  Epistaxis  Anterior epistaxis   Return for weakness, numbness, changes in vision or speech, fevers >100.4 unrelieved by medication, shortness of breath, intractable vomiting, or diarrhea, abdominal pain, Inability to tolerate liquids or food, cough, altered mental status or any concerns. No signs of systemic  illness or infection. The patient is nontoxic-appearing on exam and vital signs are within normal limits.  I have reviewed the triage vital signs and the nursing notes. Pertinent labs &imaging results that were available during my care of the patient were reviewed by me and considered in my medical decision making (see chart for details).  After history, exam, and medical workup I feel the patient has been appropriately medically screened and is safe for discharge home. Pertinent diagnoses were discussed with the patient. Patient was given return precautions   Judy Spurlock, MD 02/23/19 [phone]

## 2019-02-25 ENCOUNTER — Encounter (HOSPITAL_BASED_OUTPATIENT_CLINIC_OR_DEPARTMENT_OTHER): Payer: Self-pay | Admitting: Emergency Medicine

## 2019-02-25 ENCOUNTER — Emergency Department (HOSPITAL_BASED_OUTPATIENT_CLINIC_OR_DEPARTMENT_OTHER)
Admission: EM | Admit: 2019-02-25 | Discharge: 2019-02-25 | Disposition: A | Discharge status: Home / Self Care | Payer: Medicaid Other | Attending: Emergency Medicine | Admitting: Emergency Medicine

## 2019-02-25 ENCOUNTER — Other Ambulatory Visit: Payer: Self-pay

## 2019-02-25 DIAGNOSIS — Z91040 Latex allergy status: Secondary | ICD-10-CM | POA: Diagnosis not present

## 2019-02-25 DIAGNOSIS — M3210 Systemic lupus erythematosus, organ or system involvement unspecified: Secondary | ICD-10-CM | POA: Insufficient documentation

## 2019-02-25 DIAGNOSIS — E119 Type 2 diabetes mellitus without complications: Secondary | ICD-10-CM | POA: Diagnosis not present

## 2019-02-25 DIAGNOSIS — I10 Essential (primary) hypertension: Secondary | ICD-10-CM | POA: Diagnosis not present

## 2019-02-25 DIAGNOSIS — R040 Epistaxis: Secondary | ICD-10-CM | POA: Diagnosis present

## 2019-02-25 DIAGNOSIS — Z888 Allergy status to other drugs, medicaments and biological substances status: Secondary | ICD-10-CM | POA: Insufficient documentation

## 2019-02-25 DIAGNOSIS — J45909 Unspecified asthma, uncomplicated: Secondary | ICD-10-CM | POA: Diagnosis not present

## 2019-02-25 DIAGNOSIS — M797 Fibromyalgia: Secondary | ICD-10-CM | POA: Diagnosis not present

## 2019-02-25 DIAGNOSIS — Z886 Allergy status to analgesic agent status: Secondary | ICD-10-CM | POA: Insufficient documentation

## 2019-02-25 NOTE — ED Provider Notes (Signed)
Emergency Department Provider Note   I have reviewed the triage vital signs and the nursing notes.   HISTORY  Chief Complaint Epistaxis   HPI Judy English is a 58 y.o. female with past medical history reviewed below presents to the emergency department with intermittent nosebleeds over the past several days.  She was evaluated in the emergency department on 10/31 with similar symptoms.  She has been using Afrin with no relief in symptoms.  Denies nose trauma.  No cauterization performed in the emergency department during her first visit.  She does take 81 mg aspirin but no other antiplatelet or anticoagulation medications.  No history of recurrent nosebleeds in the past.  She does not follow regularly with ENT.  No radiation of symptoms or other modifying factors.   Past Medical History:  Diagnosis Date  . Anxiety   . Asthma   . Depression   . Diabetes mellitus   . Fibromyalgia   . Heart attack ([place])   . Hypertension   . Lupus ([place])   . Morbid obesity ([place])   . Neuropathy     Patient Active Problem List   Diagnosis Date Noted  . DIABETES MELLITUS, TYPE II, WITHOUT COMPLICATIONS [id number]  . MORBID OBESITY 04/28/2008  . DEPRESSION 04/28/2008  . HYPERTENSION, ESSENTIAL 04/28/2008  . ASTHMA, UNSPECIFIED, UNSPECIFIED STATUS 04/28/2008  . GERD 04/28/2008  . DEGENERATIVE JOINT DISEASE, BOTH KNEES, SEVERE 04/28/2008  . SPONDYLOSIS, CERVICAL, WITHOUT MYELOPATHY 04/28/2008  . MYOFASCIAL PAIN SYNDROME 04/28/2008  . OBSTRUCTIVE SLEEP APNEA 05/02/2007    Past Surgical History:  Procedure Laterality Date  . ABDOMINAL EXPLORATION SURGERY    . CESAREAN SECTION    . UTERINE FIBROID SURGERY      Allergies Other, Acetaminophen, Ibuprofen, Latex, and Omnipaque [iohexol]  No family history on file.  Social History Social History   Tobacco Use  . Smoking status: Never Smoker  . Smokeless tobacco: Never Used  Substance Use Topics  . Alcohol use: No  . Drug use: No    Review of Systems  Constitutional: No fever/chills ENT: No sore throat. Recurrent nosebleeds.  Skin: Negative for rash. Neurological: Negative for headaches.  10-point ROS otherwise negative.  ____________________________________________   PHYSICAL EXAM:  VITAL SIGNS: ED Triage Vitals  Enc Vitals Group     BP 02/25/19 0851 (!) 169/95     Pulse Rate 02/25/19 0851 81     Resp 02/25/19 0851 20     Temp 02/25/19 0851 98.5 F (36.9 C)     Temp Source 02/25/19 0851 Oral     SpO2 02/25/19 0851 100 %     Weight 02/25/19 0854 283 lb 9.6 oz (128.6 kg)     Height 02/25/19 0854 5\' 8"  (1.727 m)   Constitutional: Alert and oriented. Well appearing and in no acute distress. Eyes: Conjunctivae are normal. Head: Atraumatic. Nose: No congestion/rhinnorhea.  The anterior nasal passages are normal-appearing and without area of bleeding/oozing.  No focal area of clot to suggest recent bleeding.  Anterior turbinates are unremarkable bilaterally.  No blood in the posterior pharynx. Mouth/Throat: Mucous membranes are moist.  Oropharynx non-erythematous. Neck: No stridor.   Cardiovascular: Good peripheral circulation. Respiratory: Normal respiratory effort.  Gastrointestinal: No distention.  Musculoskeletal: No gross deformities of extremities. Neurologic:  Normal speech and language. Skin:  Skin is warm, dry and intact. No rash noted.  ____________________________________________   PROCEDURES  Procedure(s) performed:   Procedures  None  ____________________________________________   INITIAL IMPRESSION / ASSESSMENT AND PLAN / ED  COURSE  Pertinent labs & imaging results that were available during my care of the patient were reviewed by me and considered in my medical decision making (see chart for details).   Patient presents to the emergency department with recurrent epistaxis.  I did not visualize any anterior area of bleeding or recent bleeding.  Patient may have more posterior  type bleed but nothing active.  No nasal packing in this situation.  Unable to cauterize.  Will refer to ENT.  Advised moisturizing nasal spray and discussed epistaxis management at home.  She was given Afrin during her last ED visit.  Discussed ED return precautions.   ____________________________________________  FINAL CLINICAL IMPRESSION(S) / ED DIAGNOSES  Final diagnoses:  Epistaxis    Note:  This document was prepared using Dragon voice recognition software and may include unintentional dictation errors.  Nanda Quinton, MD, [facility] Emergency Medicine    Camrin Gearheart, [facility], MD 02/25/19 [phone]

## 2019-02-25 NOTE — ED Triage Notes (Signed)
Nose bleed intermittently x3 days.

## 2019-02-25 NOTE — Discharge Instructions (Signed)

## 2019-02-25 NOTE — ED Notes (Signed)
ED Provider at bedside. 

## 2019-04-09 ENCOUNTER — Ambulatory Visit: Payer: Medicaid Other | Admitting: Neurology

## 2019-04-09 ENCOUNTER — Telehealth: Payer: Self-pay | Admitting: *Deleted

## 2019-04-09 NOTE — Telephone Encounter (Signed)
No showed new patient appointment. 

## 2019-05-06 ENCOUNTER — Ambulatory Visit (INDEPENDENT_AMBULATORY_CARE_PROVIDER_SITE_OTHER): Payer: Medicaid Other | Admitting: Internal Medicine

## 2019-05-06 DIAGNOSIS — Z5329 Procedure and treatment not carried out because of patient's decision for other reasons: Secondary | ICD-10-CM

## 2019-05-06 NOTE — Patient Instructions (Signed)
Please continue: - Metformin 500 mg 2x a day, with meals - Januvia 100 mg in am - Bydureon 2 mg weekly - Lantus 25 units at bedtime - Humalog 2-14 units 3x a day, before meals  Check sugars 3 times a day, before meals, rotating check times.  Approximately twice a week, try to also check at bedtime.  Please return in 1.5 months with your sugar log.   PATIENT INSTRUCTIONS FOR TYPE 2 DIABETES:  **Please join MyChart!** - see attached instructions about how to join if you have not done so already.  DIET AND EXERCISE Diet and exercise is an important part of diabetic treatment.  We recommended aerobic exercise in the form of brisk walking (working between 40-60% of maximal aerobic capacity, similar to brisk walking) for 150 minutes per week (such as 30 minutes five days per week) along with 3 times per week performing 'resistance' training (using various gauge rubber tubes with handles) 5-10 exercises involving the major muscle groups (upper body, lower body and core) performing 10-15 repetitions (or near fatigue) each exercise. Start at half the above goal but build slowly to reach the above goals. If limited by weight, joint pain, or disability, we recommend daily walking in a swimming pool with water up to waist to reduce pressure from joints while allow for adequate exercise.    BLOOD GLUCOSES Monitoring your blood glucoses is important for continued management of your diabetes. Please check your blood glucoses 2-4 times a day: fasting, before meals and at bedtime (you can rotate these measurements - e.g. one day check before the 3 meals, the next day check before 2 of the meals and before bedtime, etc.).   HYPOGLYCEMIA (low blood sugar) Hypoglycemia is usually a reaction to not eating, exercising, or taking too much insulin/ other diabetes drugs.  Symptoms include tremors, sweating, hunger, confusion, headache, etc. Treat IMMEDIATELY with 15 grams of Carbs: . 4 glucose tablets .  cup  regular juice/soda . 2 tablespoons raisins . 4 teaspoons sugar . 1 tablespoon honey Recheck blood glucose in 15 mins and repeat above if still symptomatic/blood glucose <100.  RECOMMENDATIONS TO REDUCE YOUR RISK OF DIABETIC COMPLICATIONS: * Take your prescribed MEDICATION(S) * Follow a DIABETIC diet: Complex carbs, fiber rich foods, (monounsaturated and polyunsaturated) fats * AVOID saturated/trans fats, high fat foods, >2,300 mg salt per day. * EXERCISE at least 5 times a week for 30 minutes or preferably daily.  * DO NOT SMOKE OR DRINK more than 1 drink a day. * Check your FEET every day. Do not wear tightfitting shoes. Contact us if you develop an ulcer * See your EYE doctor once a year or more if needed * Get a FLU shot once a year * Get a PNEUMONIA vaccine once before and once after age 84 years  GOALS:  * Your Hemoglobin A1c of <7%  * fasting sugars need to be <130 * after meals sugars need to be <180 (2h after you start eating) * Your Systolic BP should be [id number] or lower  * Your Diastolic BP should be 80 or lower  * Your HDL (Good Cholesterol) should be 40 or higher  * Your LDL (Bad Cholesterol) should be 100 or lower. * Your Triglycerides should be 150 or lower  * Your Urine microalbumin (kidney function) should be <30 * Your Body Mass Index should be 25 or lower    Please consider the following ways to cut down carbs and fat and increase fiber and micronutrients in your  diet: - substitute whole grain for white bread or pasta - substitute brown rice for white rice - substitute 90-calorie flat bread pieces for slices of bread when possible - substitute sweet potatoes or yams for white potatoes - substitute humus for margarine - substitute tofu for cheese when possible - substitute almond or rice milk for regular milk (would not drink soy milk daily due to concern for soy estrogen influence on breast cancer risk) - substitute dark chocolate for other sweets when possible -  substitute water - can add lemon or orange slices for taste - for diet sodas (artificial sweeteners will trick your body that you can eat sweets without getting calories and will lead you to overeating and weight gain in the long run) - do not skip breakfast or other meals (this will slow down the metabolism and will result in more weight gain over time)  - can try smoothies made from fruit and almond/rice milk in am instead of regular breakfast - can also try old-fashioned (not instant) oatmeal made with almond/rice milk in am - order the dressing on the side when eating salad at a restaurant (pour less than half of the dressing on the salad) - eat as little meat as possible - can try juicing, but should not forget that juicing will get rid of the fiber, so would alternate with eating raw veg./fruits or drinking smoothies - use as little oil as possible, even when using olive oil - can dress a salad with a mix of balsamic vinegar and lemon juice, for e.g. - use agave nectar, stevia sugar, or regular sugar rather than artificial sweateners - steam or broil/roast veggies  - snack on veggies/fruit/nuts (unsalted, preferably) when possible, rather than processed foods - reduce or eliminate aspartame in diet (it is in diet sodas, chewing gum, etc) Read the labels!  Try to read Dr. Janene Harvey book: "Program for Reversing Diabetes" for other ideas for healthy eating.

## 2019-05-06 NOTE — Progress Notes (Signed)
Patient ID: Judy English, female   DOB: June 20, 1960, 59 y.o.   MRN: [id number]   NO SHOW  HPI: Judy English is a 59 y.o.-year-old female, referred by her PCP, Dr.**, for management of DM2, dx in *, insulin-dependent, uncontrolled, with complications (CAD, PVD, CKD, PN).  Reviewed latest HbA1c level: 12/26/2018: HbA1c >15.5% No results found for: HGBA1C  Pt is on a regimen of: - Metformin 500 mg 2x a day, with meals - Januvia 100 mg in am - Bydureon 2 mg weekly - Lantus 25 units at bedtime - Humalog 2-14 units 3x a day, before meals-started 12/2018  Pt checks her sugars ** a day and they are usually between 250s and 400s at home: - am: n/c - 2h after b'fast: n/c - before lunch: n/c - 2h after lunch: n/c - before dinner: n/c - 2h after dinner: n/c - bedtime: n/c - nighttime: n/c Lowest sugar was **; she has hypoglycemia awareness at 70.  Highest sugar was 507 on 12/19/2018 per records from PCP.  Glucometer: Accu-Chek [employer]  Pt's meals are: - Breakfast: - Lunch: - Dinner: - Snacks:  -+ CKD, last BUN/creatinine:  Lab Results  Component Value Date   BUN 20 01/03/2019   BUN 45 (H) 12/19/2018   CREATININE 1.46 (H) 01/03/2019   CREATININE 1.61 (H) 12/19/2018   - last set of lipids: No results found for: CHOL, HDL, LDLCALC, LDLDIRECT, TRIG, CHOLHDL  - last eye exam was in **. No DR.   - + numbness and tingling in her feet.  On gabapentin 600 mg 3 times a day.  Pt has FH of DM in **.  She also has a a history of HTN, GERD, RA, fibromyalgia, asthma, OSA-on CPAP.  She is seen at [facility] for depression/anxiety- On fluoxetine.  ROS: Constitutional: no weight gain, no weight loss, no fatigue, no subjective hyperthermia, no subjective hypothermia, no nocturia Eyes: no blurry vision, no xerophthalmia ENT: no sore throat, no nodules palpated in neck, no dysphagia, no odynophagia, no hoarseness, no tinnitus, no hypoacusis Cardiovascular: no CP, no SOB, no  palpitations, no leg swelling Respiratory: no cough, no SOB, no wheezing Gastrointestinal: no N, no V, no D, no C, no acid reflux Musculoskeletal: no muscle, no joint aches Skin: no rash, no hair loss Neurological: no tremors, no numbness or tingling/no dizziness/no HAs Psychiatric: no depression, no anxiety  PE: LMP 09/08/2016  Wt Readings from Last 3 Encounters:  02/25/19 283 lb 9.6 oz (128.6 kg)  02/23/19 274 lb (124.3 kg)  12/19/18 280 lb 13.9 oz (127.4 kg)   Constitutional: overweight, in NAD Eyes: PERRLA, EOMI, no exophthalmos ENT: moist mucous membranes, no thyromegaly, no cervical lymphadenopathy Cardiovascular: RRR, No MRG Respiratory: CTA B Gastrointestinal: abdomen soft, NT, ND, BS+ Musculoskeletal: no deformities, strength intact in all 4 Skin: moist, warm, no rashes Neurological: no tremor with outstretched hands, DTR normal in all 4  ASSESSMENT: 1. DM2, insulin-dependent, uncontrolled, with long-term complications - CAD - PVD - CKD - PN   PLAN:  1. Patient with long-standing, uncontrolled diabetes, on oral antidiabetic regimen, which became insufficient. - I suggested to:  There are no Patient Instructions on file for this visit. - Strongly advised her to start checking sugars at different times of the day - check 1x a day, rotating checks - discussed about CBG targets for treatment: 80-130 mg/dL before meals and <180 mg/dL after meals; target HbA1c <7%. - given sugar log and advised how to fill it and to bring it  at next appt  - given foot care handout and explained the principles  - given instructions for hypoglycemia management "15-15 rule"  - advised for yearly eye exams  - Return to clinic in 3 mo with sugar log   Philemon Kingdom, MD PhD [facility] End[facility]

## 2019-05-19 ENCOUNTER — Encounter: Payer: Self-pay | Admitting: Internal Medicine

## 2019-05-20 ENCOUNTER — Emergency Department (HOSPITAL_COMMUNITY)
Admission: EM | Admit: 2019-05-20 | Discharge: 2019-05-20 | Disposition: A | Discharge status: Home / Self Care | Payer: Medicaid Other | Attending: Emergency Medicine | Admitting: Emergency Medicine

## 2019-05-20 ENCOUNTER — Encounter (HOSPITAL_COMMUNITY): Payer: Self-pay | Admitting: *Deleted

## 2019-05-20 ENCOUNTER — Other Ambulatory Visit: Payer: Self-pay

## 2019-05-20 ENCOUNTER — Emergency Department (HOSPITAL_COMMUNITY): Payer: Medicaid Other

## 2019-05-20 DIAGNOSIS — Z91040 Latex allergy status: Secondary | ICD-10-CM | POA: Diagnosis not present

## 2019-05-20 DIAGNOSIS — Z7982 Long term (current) use of aspirin: Secondary | ICD-10-CM | POA: Diagnosis not present

## 2019-05-20 DIAGNOSIS — I10 Essential (primary) hypertension: Secondary | ICD-10-CM | POA: Diagnosis not present

## 2019-05-20 DIAGNOSIS — R0602 Shortness of breath: Secondary | ICD-10-CM | POA: Diagnosis present

## 2019-05-20 DIAGNOSIS — Z794 Long term (current) use of insulin: Secondary | ICD-10-CM | POA: Diagnosis not present

## 2019-05-20 DIAGNOSIS — G8929 Other chronic pain: Secondary | ICD-10-CM | POA: Insufficient documentation

## 2019-05-20 DIAGNOSIS — J45909 Unspecified asthma, uncomplicated: Secondary | ICD-10-CM | POA: Insufficient documentation

## 2019-05-20 DIAGNOSIS — L299 Pruritus, unspecified: Secondary | ICD-10-CM | POA: Diagnosis not present

## 2019-05-20 DIAGNOSIS — Z79899 Other long term (current) drug therapy: Secondary | ICD-10-CM | POA: Insufficient documentation

## 2019-05-20 DIAGNOSIS — R6889 Other general symptoms and signs: Secondary | ICD-10-CM

## 2019-05-20 DIAGNOSIS — E119 Type 2 diabetes mellitus without complications: Secondary | ICD-10-CM | POA: Insufficient documentation

## 2019-05-20 LAB — BASIC METABOLIC PANEL
Anion gap: 10 (ref 5–15)
BUN: 23 mg/dL — ABNORMAL HIGH (ref 6–20)
CO2: 23 mmol/L (ref 22–32)
Calcium: 8.7 mg/dL — ABNORMAL LOW (ref 8.9–10.3)
Chloride: 104 mmol/L (ref 98–111)
Creatinine, Ser: 1.56 mg/dL — ABNORMAL HIGH (ref 0.44–1.00)
GFR calc Af Amer: 43 mL/min — ABNORMAL LOW (ref >60)
GFR calc non Af Amer: 37 mL/min — ABNORMAL LOW (ref >60)
Glucose, Bld: 360 mg/dL — ABNORMAL HIGH (ref 70–99)
Potassium: 4 mmol/L (ref 3.5–5.1)
Sodium: 137 mmol/L (ref 135–145)

## 2019-05-20 LAB — CBC
HCT: 34.4 % — ABNORMAL LOW (ref 36.0–46.0)
Hemoglobin: 11.4 g/dL — ABNORMAL LOW (ref 12.0–15.0)
MCH: 27.8 pg (ref 26.0–34.0)
MCHC: 33.1 g/dL (ref 30.0–36.0)
MCV: 83.9 fL (ref 80.0–100.0)
Platelets: 214 10*3/uL (ref 150–400)
RBC: 4.1 MIL/uL (ref 3.87–5.11)
RDW: 11.9 % (ref 11.5–15.5)
WBC: 6.4 10*3/uL (ref 4.0–10.5)
nRBC: 0 % (ref 0.0–0.2)

## 2019-05-20 MED ORDER — AMLODIPINE BESYLATE 5 MG PO TABS
5.0000 mg | ORAL_TABLET | Freq: Once | ORAL | Status: AC
  Administered 2019-05-20: 5 mg via ORAL
  Filled 2019-05-20: qty 1

## 2019-05-20 MED ORDER — ATENOLOL 50 MG PO TABS
50.0000 mg | ORAL_TABLET | Freq: Once | ORAL | Status: AC
  Administered 2019-05-20: 50 mg via ORAL
  Filled 2019-05-20: qty 1

## 2019-05-20 MED ORDER — DIPHENHYDRAMINE HCL 25 MG PO CAPS
25.0000 mg | ORAL_CAPSULE | Freq: Once | ORAL | Status: AC
  Administered 2019-05-20: 25 mg via ORAL
  Filled 2019-05-20: qty 1

## 2019-05-20 NOTE — ED Notes (Signed)
Patient transported to XR. 

## 2019-05-20 NOTE — Discharge Instructions (Addendum)
Continue your home medications.  Make sure not to skip doses of your blood pressure medication.  You can continue benadryl as needed for itching. Follow-up with your primary care doctor. Return to the ED for new or worsening symptoms.

## 2019-05-20 NOTE — ED Provider Notes (Signed)
[facility] EMERGENCY DEPARTMENT Provider Note   CSN: [id number] Arrival date & time: 05/20/19  0115     History Chief Complaint  Patient presents with  . Shortness of Breath    Judy English is a 59 y.o. female.  The history is provided by the patient and medical records.   59 y.o. F with hx of anxiety, asthma, depression, DM, fibromyalgia, HTN, neuropathy, presenting to the ED with multiple complaints.  1.  Itching-- this is her most prominent complaint.  States it just started after arriving to the hospital.  States mostly in her legs and feels "bumps" on her legs.  She is concerned it may be to the blankets they placed onto her on arrival.  Denies rash at home.  No changes in soaps, detergents, or other personal care products.    2.  Diffuse bodily pain-- states history of fibromyalgia with same.  Does have pain in both of "rotary cups" but has chronic tears and disease.  No new injuries, trauma, or falls.  No chest pain.  She is currently on chronic oxycodone.  3.  SOB-- states just had this when she woke up from a nap at home.  No cough, fever, chest pain, sweating, dizziness, or weakness.  She is hypertensive.  Has not had her home BP medications today but otherwise has taken all of her medications.  Past Medical History:  Diagnosis Date  . Anxiety   . Asthma   . Depression   . Diabetes mellitus   . Fibromyalgia   . Heart attack ([place])   . Hypertension   . Lupus ([place])   . Morbid obesity ([place])   . Neuropathy     Patient Active Problem List   Diagnosis Date Noted  . DIABETES MELLITUS, TYPE II, WITHOUT COMPLICATIONS [id number]  . MORBID OBESITY 04/28/2008  . DEPRESSION 04/28/2008  . HYPERTENSION, ESSENTIAL 04/28/2008  . ASTHMA, UNSPECIFIED, UNSPECIFIED STATUS 04/28/2008  . GERD 04/28/2008  . DEGENERATIVE JOINT DISEASE, BOTH KNEES, SEVERE 04/28/2008  . SPONDYLOSIS, CERVICAL, WITHOUT MYELOPATHY 04/28/2008  . MYOFASCIAL PAIN SYNDROME 04/28/2008   . OBSTRUCTIVE SLEEP APNEA 05/02/2007    Past Surgical History:  Procedure Laterality Date  . ABDOMINAL EXPLORATION SURGERY    . CESAREAN SECTION    . UTERINE FIBROID SURGERY       OB History   No obstetric history on file.     No family history on file.  Social History   Tobacco Use  . Smoking status: Never Smoker  . Smokeless tobacco: Never Used  Substance Use Topics  . Alcohol use: No  . Drug use: No    Home Medications Prior to Admission medications   Medication Sig Start Date End Date Taking? Authorizing Provider  albuterol (PROVENTIL HFA;VENTOLIN HFA) 108 (90 BASE) MCG/ACT inhaler Inhale 2 puffs into the lungs every 6 (six) hours as needed. For shortness of breath and wheezing     [provider]  amLODipine (NORVASC) 5 MG tablet Take 5 mg by mouth daily. 11/29/18   [provider]  aspirin 81 MG chewable tablet Chew 81 mg by mouth daily. 02/18/19   [provider]  atenolol (TENORMIN) 50 MG tablet Take 50 mg by mouth daily. 02/18/19   [provider]  BD ULTRA-FINE PEN NEEDLES 29G X 12.7MM MISC USE TO INJECT INSULIN UP TO TID 01/24/19   [provider]  buPROPion (WELLBUTRIN XL) 150 MG 24 hr tablet Take 150 mg by mouth 3 (three) times daily as  needed. 11/29/18   [provider]  BYDUREON 2 MG PEN Inject 2 mg as directed once a week. 11/29/18   [provider]  celecoxib (CELEBREX) 100 MG capsule  02/18/19   [provider]  chlorthalidone (HYGROTON) 25 MG tablet Take 50 mg by mouth daily. 11/29/18   [provider]  cyclobenzaprine (FLEXERIL) 10 MG tablet Take 1 tablet (10 mg total) by mouth 2 (two) times daily as needed for muscle spasms. 08/30/16   Drenda Freeze, MD  FLUoxetine (PROZAC) 40 MG capsule Take 40 mg by mouth daily.    [provider]  fluticasone (FLONASE) 50 MCG/ACT nasal spray Place 2 sprays into both nostrils daily. 04/02/18   Horton, Barbette Hair, MD   Fluticasone-Salmeterol (ADVAIR) 500-50 MCG/DOSE AEPB Inhale 1 puff into the lungs every 12 (twelve) hours.      [provider]  furosemide (LASIX) 40 MG tablet Take 40 mg by mouth daily. 02/21/19   [provider]  gabapentin (NEURONTIN) 600 MG tablet Take 1 tablet by mouth 3 (three) times daily. 11/29/18   [provider]  hydrOXYzine (ATARAX/VISTARIL) 25 MG tablet Take 25 mg by mouth 4 (four) times daily.    [provider]  insulin aspart (NOVOLOG) 100 UNIT/ML injection Inject 10 Units into the skin 4 (four) times daily as needed for high blood sugar (sliding scale). 09/02/15   Fredia Sorrow, MD  Insulin Glargine (TOUJEO SOLOSTAR) 300 UNIT/ML SOPN Inject 90 Units into the skin 2 (two) times daily. Patient not taking: Reported on 07/14/2018 01/29/16   Recardo Evangelist, PA-C  JANUVIA 100 MG tablet Take 100 mg by mouth daily. 11/29/18   [provider]  LORazepam (ATIVAN) 0.5 MG tablet Take 0.5 mg by mouth 2 (two) times daily as needed for anxiety. 05/02/18   [provider]  omeprazole (PRILOSEC) 40 MG capsule Take 40 mg by mouth daily.    [provider]  ondansetron (ZOFRAN) 4 MG tablet Take 1 tablet (4 mg total) by mouth every 6 (six) hours. 07/07/18   Law, Bea Graff, PA-C  oxyCODONE-acetaminophen (PERCOCET) 10-325 MG tablet Take 1 tablet by mouth every 6 (six) hours as needed for pain. 06/28/18   [provider]  sodium chloride (OCEAN) 0.65 % SOLN nasal spray Place 1 spray into both nostrils as needed for congestion. Patient not taking: Reported on 01/03/2019 04/02/18   Horton, Barbette Hair, MD  triamcinolone lotion (KENALOG) 0.1 % APP EXT AA TID FOR 7 DAYS 02/18/19   [provider]    Allergies    Other, Acetaminophen, Ibuprofen, Latex, and Omnipaque [iohexol]  Review of Systems   Review of Systems  Respiratory: Positive for shortness of breath.   Musculoskeletal: Positive for arthralgias.  All other systems  reviewed and are negative.   Physical Exam Updated Vital Signs BP (!) 156/129   Pulse 97   Temp 98.1 F (36.7 C) (Oral)   Resp 18   Ht 5\' 6"  (1.676 m)   Wt 123.4 kg   LMP 09/08/2016   SpO2 100%   BMI 43.90 kg/m   Physical Exam Vitals and nursing note reviewed.  Constitutional:      Appearance: She is well-developed.  HENT:     Head: Normocephalic and atraumatic.  Eyes:     Conjunctiva/sclera: Conjunctivae normal.     Pupils: Pupils are equal, round, and reactive to light.  Cardiovascular:     Rate and Rhythm: Normal rate and regular rhythm.  Heart sounds: Normal heart sounds.  Pulmonary:     Effort: Pulmonary effort is normal.     Breath sounds: Normal breath sounds. No wheezing, rhonchi or rales.     Comments: Speaking in paragraphs without issue, O2 sats 100% on RA, lungs grossly clear Abdominal:     General: Bowel sounds are normal.     Palpations: Abdomen is soft.  Musculoskeletal:        General: Normal range of motion.     Cervical back: Normal range of motion.     Comments: Small bumps noted to left medial knee but no surrounding swelling or cellulitic changes, repeatedly scratching both anterior knees during exam, excoriation noted  Skin:    General: Skin is warm and dry.  Neurological:     Mental Status: She is alert and oriented to person, place, and time.     Comments: Awake, alert, oriented; able to follow commands and answer questions appropriately, moving arms and legs well without issue, speech is clear and goal oriented     ED Results / Procedures / Treatments   Labs (all labs ordered are listed, but only abnormal results are displayed) Labs Reviewed  BASIC METABOLIC PANEL - Abnormal; Notable for the following components:      Result Value   Glucose, Bld 360 (*)    BUN 23 (*)    Creatinine, Ser 1.56 (*)    Calcium 8.7 (*)    GFR calc non Af Amer 37 (*)    GFR calc Af Amer 43 (*)    All other components within normal limits  CBC -  Abnormal; Notable for the following components:   Hemoglobin 11.4 (*)    HCT 34.4 (*)    All other components within normal limits    EKG EKG Interpretation  Date/Time:  Monday May 20 2019 01:26:57 EST Ventricular Rate:  100 PR Interval:  182 QRS Duration: 86 QT Interval:  362 QTC Calculation: 466 R Axis:   -41 Text Interpretation: Normal sinus rhythm Left axis deviation Possible Anterior infarct , age undetermined Abnormal ECG No significant change since last tracing Confirmed by Pryor Curia [phone]) on 05/20/2019 1:40:47 AM   Radiology DG Chest 2 View  Result Date: 05/20/2019 CLINICAL DATA:  Shortness of breath EXAM: CHEST - 2 VIEW COMPARISON:  01/03/2019 FINDINGS: Heart and mediastinal contours are within normal limits. No focal opacities or effusions. No acute bony abnormality. IMPRESSION: No active cardiopulmonary disease. Electronically Signed   By: Rolm Baptise M.D.   On: 05/20/2019 01:53    Procedures Procedures (including critical care time)  Medications Ordered in ED Medications  amLODipine (NORVASC) tablet 5 mg (5 mg Oral Given 05/20/19 0217)  atenolol (TENORMIN) tablet 50 mg (50 mg Oral Given 05/20/19 0217)  diphenhydrAMINE (BENADRYL) capsule 25 mg (25 mg Oral Given 05/20/19 0217)    ED Course  I have reviewed the triage vital signs and the nursing notes.  Pertinent labs & imaging results that were available during my care of the patient were reviewed by me and considered in my medical decision making (see chart for details).    MDM Rules/Calculators/A&P  59 y.o. F here with various complaints.  1.  Itching-- just started upon arrival to ED.  Some spots noted to left knee but seems more consistent with bug bites.  No signs of superimposed infection or cellulitis.  No changes in soaps, detergents, or other personal care products.  States she is sensitive to some type of "chemicals".  She  thinks it is from the blankets here in the ED.  Given dose of Benadryl,  seems to have stopped itching and has been resting comfortably.  2.  Diffuse bodily pain--history of fibromyalgia and reports similar.  She is not had any recent falls or trauma.  Also reports chronic pain in her "rotary cups" due to known injuries.  She is on home oxycodone.  Do not feel she needs further intervention for this.  Can continue home meds.  3.  Shortness of breath--states that just started tonight, vague when speaking about this.  No fever or cough associated.  No chest pain.  Work-up here is overall reassuring-- some hyperglycemia without evidence of DKA.  Based on prior values, this is around her baseline glucose.  Vitals have remained stable on room air.  She is speaking in paragraphs and not displaying any signs of respiratory distress.  Feel patient is stable for discharge.  Can follow-up with PCP.  Return here for any new/acute changes.  Return here for any new/acute changes.  Final Clinical Impression(s) / ED Diagnoses Final diagnoses:  Multiple complaints  Itching  Shortness of breath  Other chronic pain    Rx / DC Orders ED Discharge Orders    None       Larene Pickett, PA-C 05/20/19 0542    Ward, Delice Bison, DO 05/20/19 [phone]

## 2019-05-20 NOTE — ED Triage Notes (Signed)
Pt arrives via [facility] from home with c/o SOB while lying in bed. Lung sounds clear, saturations WNL. Roommate says the pt speech "is off" and her "gait is off". She does have a hx of lupus and MI. LSN was yesterday. Pain all over (chronic). Anxious. 240/130 initial, improved to SBP 206/120, hr 110, 98.1T, , has not taken her BP meds today, otherwise she is compliant. IV established in the left arm. CBG 356, EKG WNL.

## 2019-05-20 NOTE — ED Notes (Signed)
Discharge instructions discussed with pt. Pt verbalized understanding with no questions at this time. Pt to go home with cab.

## 2019-06-19 ENCOUNTER — Institutional Professional Consult (permissible substitution): Payer: Medicaid Other | Admitting: Pulmonary Disease

## 2019-07-09 ENCOUNTER — Institutional Professional Consult (permissible substitution): Payer: Medicaid Other | Admitting: Pulmonary Disease

## 2019-07-09 ENCOUNTER — Other Ambulatory Visit: Payer: Self-pay

## 2019-07-10 ENCOUNTER — Ambulatory Visit (INDEPENDENT_AMBULATORY_CARE_PROVIDER_SITE_OTHER): Payer: Medicaid Other | Admitting: Internal Medicine

## 2019-07-10 ENCOUNTER — Encounter: Payer: Self-pay | Admitting: Internal Medicine

## 2019-07-10 ENCOUNTER — Ambulatory Visit: Payer: Medicaid Other | Admitting: Internal Medicine

## 2019-07-10 DIAGNOSIS — Z5329 Procedure and treatment not carried out because of patient's decision for other reasons: Secondary | ICD-10-CM

## 2019-07-10 NOTE — Progress Notes (Signed)
Patient ID: Judy English, female   DOB: Jun 02, 1960, 59 y.o.   MRN: [id number]   NO SHOW x2 >> I will not schedule the pt.

## 2019-10-09 ENCOUNTER — Institutional Professional Consult (permissible substitution): Payer: Medicaid Other | Admitting: Pulmonary Disease

## 2019-10-11 ENCOUNTER — Other Ambulatory Visit: Payer: Self-pay | Admitting: Physician Assistant

## 2019-10-11 DIAGNOSIS — Z1231 Encounter for screening mammogram for malignant neoplasm of breast: Secondary | ICD-10-CM

## 2019-10-23 ENCOUNTER — Other Ambulatory Visit: Payer: Self-pay

## 2019-10-23 ENCOUNTER — Ambulatory Visit
Admission: RE | Admit: 2019-10-23 | Discharge: 2019-10-23 | Disposition: A | Discharge status: Home / Self Care | Payer: Medicaid Other | Source: Ambulatory Visit | Attending: Physician Assistant | Admitting: Physician Assistant

## 2019-10-23 DIAGNOSIS — Z1231 Encounter for screening mammogram for malignant neoplasm of breast: Secondary | ICD-10-CM

## 2019-11-06 ENCOUNTER — Telehealth (INDEPENDENT_AMBULATORY_CARE_PROVIDER_SITE_OTHER): Payer: Medicaid Other | Admitting: Psychiatry

## 2019-11-06 ENCOUNTER — Encounter (HOSPITAL_COMMUNITY): Payer: Self-pay | Admitting: Psychiatry

## 2019-11-06 DIAGNOSIS — F331 Major depressive disorder, recurrent, moderate: Secondary | ICD-10-CM

## 2019-11-06 DIAGNOSIS — F4321 Adjustment disorder with depressed mood: Secondary | ICD-10-CM

## 2019-11-06 DIAGNOSIS — F411 Generalized anxiety disorder: Secondary | ICD-10-CM

## 2019-11-06 MED ORDER — SERTRALINE HCL 50 MG PO TABS: 50.0000 mg | ORAL_TABLET | Freq: Every day | ORAL | 0 refills | Status: AC

## 2019-11-06 NOTE — Progress Notes (Signed)
Psychiatric Initial Adult Assessment   Patient Identification: Judy English MRN:  [id number] Date of Evaluation:  11/06/2019 Referral Source: primary care Chief Complaint:  depression Visit Diagnosis:    ICD-10-CM   1. MDD (major depressive disorder), recurrent episode, moderate (HCC)  F33.1   2. GAD (generalized anxiety disorder)  F41.1   3. Unresolved grief  F43.21     I connected with [name] on 11/06/19 at  9:00 AM EDT by a video enabled telemedicine application and verified that I am speaking with the correct person using two identifiers.   I discussed the limitations of evaluation and management by telemedicine and the availability of in person appointments. The patient expressed understanding and agreed to proceed. History of Present Illness:  59 years old married AA female referred by PCP for anxiety and depression  Has multiple medical conditions including Obesity, DDD, NIDDM causing limitations to movement , balance and has CNA help 7 days. Says with all this and past loosing of mom in 2007 she has developed anxiety, worries, excessive and dwells on worries,  Was treated with wellbutrin and zoloft in past that helped  Says depression got worse in 2007 after loosing mom and she felt not recovered fully since then  Medical conditions keep limitations to her activities and keeps her down. Endorses crying spells, tiredness, mylagias, decreased concentration and despair at times  Has support of son,  Husband is sick as well or dealing with his illnesses.  No mania, psychosis Has not been on psych meds for a while, says xanax has helped in the past and was discussing to be on some med would give relief and not may take long term.   Aggravating factor: medical co morbidity, loss mom Modifying factor: son  Duration since 2007    Past Psychiatric History: depression, anxiety  Previous Psychotropic Medications: Yes   Substance Abuse History in the last  12 months:  No.  Consequences of Substance Abuse: NA  Past Medical History:  Past Medical History:  Diagnosis Date   Anxiety    Asthma    Depression    Diabetes mellitus    Fibromyalgia    Heart attack ([place])    Hypertension    Lupus ([place])    Morbid obesity ([place])    Neuropathy     Past Surgical History:  Procedure Laterality Date   ABDOMINAL EXPLORATION SURGERY     CESAREAN SECTION     UTERINE FIBROID SURGERY      Family Psychiatric History: depression amongst parents was stated  Family History: History reviewed. No pertinent family history.  Social History:   Social History   Socioeconomic History   Marital status: Married    Spouse name: Not on file   Number of children: Not on file   Years of education: Not on file   Highest education level: Not on file  Occupational History   Not on file  Tobacco Use   Smoking status: Never Smoker   Smokeless tobacco: Never Used  Substance and Sexual Activity   Alcohol use: No   Drug use: No   Sexual activity: Not on file  Other Topics Concern   Not on file  Social History Narrative   Not on file   Social Determinants of Health   Financial Resource Strain:    Difficulty of Paying Living Expenses:   Food Insecurity:    Worried About [place] in the Last Year:    Arboriculturist in  the Last Year:   Transportation Needs:    Film/video editor (Medical):    Lack of Transportation (Non-Medical):   Physical Activity:    Days of Exercise per Week:    Minutes of Exercise per Session:   Stress:    Feeling of Stress :   Social Connections:    Frequency of Communication with Friends and Family:    Frequency of Social Gatherings with Friends and Family:    Attends Religious Services:    Active Member of Clubs or Organizations:    Attends Archivist Meetings:    Marital Status:     Additional Social History: grew up with parents, states was mixture of  good and bad memories, didn't talk about bad memories and wanted to move on.  Allergies:   Allergies  Allergen Reactions   Other    Acetaminophen Rash   Ibuprofen Rash   Latex Rash   Omnipaque [Iohexol] Itching    Pt has itching with iv contrast, needs premeds prior to exam    Metabolic Disorder Labs: No results found for: HGBA1C, MPG No results found for: PROLACTIN No results found for: CHOL, TRIG, HDL, CHOLHDL, VLDL, LDLCALC No results found for: TSH  Therapeutic Level Labs: No results found for: LITHIUM No results found for: CBMZ No results found for: VALPROATE  Current Medications: Current Outpatient Medications  Medication Sig Dispense Refill   albuterol (PROVENTIL HFA;VENTOLIN HFA) 108 (90 BASE) MCG/ACT inhaler Inhale 2 puffs into the lungs every 6 (six) hours as needed. For shortness of breath and wheezing      amLODipine (NORVASC) 5 MG tablet Take 5 mg by mouth daily.     aspirin 81 MG chewable tablet Chew 81 mg by mouth daily.     atenolol (TENORMIN) 50 MG tablet Take 50 mg by mouth daily.     BD ULTRA-FINE PEN NEEDLES 29G X 12.7MM MISC USE TO INJECT INSULIN UP TO TID     buPROPion (WELLBUTRIN XL) 150 MG 24 hr tablet Take 150 mg by mouth 3 (three) times daily as needed.     BYDUREON 2 MG PEN Inject 2 mg as directed once a week.     celecoxib (CELEBREX) 100 MG capsule      chlorthalidone (HYGROTON) 25 MG tablet Take 50 mg by mouth daily.     cyclobenzaprine (FLEXERIL) 10 MG tablet Take 1 tablet (10 mg total) by mouth 2 (two) times daily as needed for muscle spasms. 20 tablet 0   FLUoxetine (PROZAC) 40 MG capsule Take 40 mg by mouth daily.     fluticasone (FLONASE) 50 MCG/ACT nasal spray Place 2 sprays into both nostrils daily. 16 g 0   Fluticasone-Salmeterol (ADVAIR) 500-50 MCG/DOSE AEPB Inhale 1 puff into the lungs every 12 (twelve) hours.       furosemide (LASIX) 40 MG tablet Take 40 mg by mouth daily.     gabapentin (NEURONTIN) 600 MG tablet  Take 1 tablet by mouth 3 (three) times daily.     hydrOXYzine (ATARAX/VISTARIL) 25 MG tablet Take 25 mg by mouth 4 (four) times daily.     insulin aspart (NOVOLOG) 100 UNIT/ML injection Inject 10 Units into the skin 4 (four) times daily as needed for high blood sugar (sliding scale). 10 mL 2   Insulin Glargine (TOUJEO SOLOSTAR) 300 UNIT/ML SOPN Inject 90 Units into the skin 2 (two) times daily. (Patient not taking: Reported on 07/14/2018) 1.5 mL 0   JANUVIA 100 MG tablet Take 100 mg by mouth  daily.     LORazepam (ATIVAN) 0.5 MG tablet Take 0.5 mg by mouth 2 (two) times daily as needed for anxiety.     omeprazole (PRILOSEC) 40 MG capsule Take 40 mg by mouth daily.     ondansetron (ZOFRAN) 4 MG tablet Take 1 tablet (4 mg total) by mouth every 6 (six) hours. 12 tablet 0   oxyCODONE-acetaminophen (PERCOCET) 10-325 MG tablet Take 1 tablet by mouth every 6 (six) hours as needed for pain.     sertraline (ZOLOFT) 50 MG tablet Take 1 tablet (50 mg total) by mouth daily. 30 tablet 0   sodium chloride (OCEAN) 0.65 % SOLN nasal spray Place 1 spray into both nostrils as needed for congestion. (Patient not taking: Reported on 01/03/2019) 480 mL 0   triamcinolone lotion (KENALOG) 0.1 % APP EXT AA TID FOR 7 DAYS     No current facility-administered medications for this visit.      Psychiatric Specialty Exam: Review of Systems  Cardiovascular: Negative for chest pain.  Musculoskeletal: Positive for myalgias.  Psychiatric/Behavioral: Positive for decreased concentration and dysphoric mood. Negative for suicidal ideas.    Last menstrual period 09/08/2016.There is no height or weight on file to calculate BMI.  General Appearance: Casual  Eye Contact:  Fair  Speech:  Slow  Volume:  Decreased  Mood:  Dysphoric  Affect:  Congruent  Thought Process:  Goal Directed  Orientation:  Full (Time, Place, and Person)  Thought Content:  Rumination  Suicidal Thoughts:  No  Homicidal Thoughts:  No  Memory:   Immediate;   Fair Recent;   Fair  Judgement:  Fair  Insight:  Shallow  Psychomotor Activity:  Decreased  Concentration:  Concentration: Fair and Attention Span: Fair  Recall:  [employer] of Knowledge:Fair  Language: Fair  Akathisia:  No  Handed:   AIMS (if indicated):  not done  Assets:  Desire for Improvement Leisure Time  ADL's:limited ,   Cognition: WNL  Sleep:  Fair   Screenings:   Assessment and Plan:as follow MDD recurrent moderate: depression stemming from medical co morbidity, limitations in movements and grief as well Start zoloft 50mg , can take half first 2 days, have taken this med before and has helped  GAD: start zoloft and recommend therapy to deal with coping skills Discouraged xanax and discussed its effect  Grief: mom died in 07/22/2005 says that spiralled depression, should consider therapy along with meds as above  FU 3-4 weeks or early if needed    I discussed the assessment and treatment plan with the patient. The patient was provided an opportunity to ask questions and all were answered. The patient agreed with the plan and demonstrated an understanding of the instructions.   The patient was advised to call back or seek an in-person evaluation if the symptoms worsen or if the condition fails to improve as anticipated.  I provided  40  minutes of non-face-to-face time during this encounter. Merian Capron, MD 7/14/20219:25 AM

## 2019-11-07 ENCOUNTER — Institutional Professional Consult (permissible substitution): Payer: Medicaid Other | Admitting: Internal Medicine

## 2019-11-07 NOTE — Progress Notes (Deleted)
11/07/19- 65 yoF never smoker for sleep evaluation courtesy of [facility], [place]. Medical problem list includes HTN, GERD, DM2, Cervical Spondylosis, DJD knees, Morbid Obesity, Anxiety/ Depression, Asthma, Pt saw Dr Gwenette Greet in 2009 with hx then that she was originally dx'd w OSA around 2002 and on CPAP since from [facility] in [place]. Sleep then was being impacted by chronic pain. Settings were not available.

## 2019-11-26 ENCOUNTER — Other Ambulatory Visit: Payer: Self-pay

## 2019-11-26 ENCOUNTER — Ambulatory Visit: Payer: Medicaid Other | Admitting: Podiatry

## 2019-11-26 ENCOUNTER — Encounter: Payer: Self-pay | Admitting: Podiatry

## 2019-11-26 VITALS — BP 173/109 | HR 96 | Temp 95.3°F

## 2019-11-26 DIAGNOSIS — E119 Type 2 diabetes mellitus without complications: Secondary | ICD-10-CM

## 2019-11-26 DIAGNOSIS — E1142 Type 2 diabetes mellitus with diabetic polyneuropathy: Secondary | ICD-10-CM | POA: Diagnosis not present

## 2019-11-26 DIAGNOSIS — B351 Tinea unguium: Secondary | ICD-10-CM

## 2019-11-26 DIAGNOSIS — M79675 Pain in left toe(s): Secondary | ICD-10-CM

## 2019-11-26 DIAGNOSIS — M2142 Flat foot [pes planus] (acquired), left foot: Secondary | ICD-10-CM

## 2019-11-26 DIAGNOSIS — M79674 Pain in right toe(s): Secondary | ICD-10-CM

## 2019-11-26 DIAGNOSIS — M2141 Flat foot [pes planus] (acquired), right foot: Secondary | ICD-10-CM

## 2019-11-26 DIAGNOSIS — M2041 Other hammer toe(s) (acquired), right foot: Secondary | ICD-10-CM | POA: Diagnosis not present

## 2019-11-26 NOTE — Patient Instructions (Addendum)
Diabetes Mellitus and Foot Care Foot care is an important part of your health, especially when you have diabetes. Diabetes may cause you to have problems because of poor blood flow (circulation) to your feet and legs, which can cause your skin to:  Become thinner and drier.  Break more easily.  Heal more slowly.  Peel and crack. You may also have nerve damage (neuropathy) in your legs and feet, causing decreased feeling in them. This means that you may not notice minor injuries to your feet that could lead to more serious problems. Noticing and addressing any potential problems early is the best way to prevent future foot problems. How to care for your feet Foot hygiene  Wash your feet daily with warm water and mild soap. Do not use hot water. Then, pat your feet and the areas between your toes until they are completely dry. Do not soak your feet as this can dry your skin.  Trim your toenails straight across. Do not dig under them or around the cuticle. File the edges of your nails with an emery board or nail file.  Apply a moisturizing lotion or petroleum jelly to the skin on your feet and to dry, brittle toenails. Use lotion that does not contain alcohol and is unscented. Do not apply lotion between your toes. Shoes and socks  Wear clean socks or stockings every day. Make sure they are not too tight. Do not wear knee-high stockings since they may decrease blood flow to your legs.  Wear shoes that fit properly and have enough cushioning. Always look in your shoes before you put them on to be sure there are no objects inside.  To break in new shoes, wear them for just a few hours a day. This prevents injuries on your feet. Wounds, scrapes, corns, and calluses  Check your feet daily for blisters, cuts, bruises, sores, and redness. If you cannot see the bottom of your feet, use a mirror or ask someone for help.  Do not cut corns or calluses or try to remove them with medicine.  If you  find a minor scrape, cut, or break in the skin on your feet, keep it and the skin around it clean and dry. You may clean these areas with mild soap and water. Do not clean the area with peroxide, alcohol, or iodine.  If you have a wound, scrape, corn, or callus on your foot, look at it several times a day to make sure it is healing and not infected. Check for: ? Redness, swelling, or pain. ? Fluid or blood. ? Warmth. ? Pus or a bad smell. General instructions  Do not cross your legs. This may decrease blood flow to your feet.  Do not use heating pads or hot water bottles on your feet. They may burn your skin. If you have lost feeling in your feet or legs, you may not know this is happening until it is too late.  Protect your feet from hot and cold by wearing shoes, such as at the beach or on hot pavement.  Schedule a complete foot exam at least once a year (annually) or more often if you have foot problems. If you have foot problems, report any cuts, sores, or bruises to your health care provider immediately. Contact a health care provider if:  You have a medical condition that increases your risk of infection and you have any cuts, sores, or bruises on your feet.  You have an injury that is not   healing.  You have redness on your legs or feet.  You feel burning or tingling in your legs or feet.  You have pain or cramps in your legs and feet.  Your legs or feet are numb.  Your feet always feel cold.  You have pain around a toenail. Get help right away if:  You have a wound, scrape, corn, or callus on your foot and: ? You have pain, swelling, or redness that gets worse. ? You have fluid or blood coming from the wound, scrape, corn, or callus. ? Your wound, scrape, corn, or callus feels warm to the touch. ? You have pus or a bad smell coming from the wound, scrape, corn, or callus. ? You have a fever. ? You have a red line going up your leg. Summary  Check your feet every day  for cuts, sores, red spots, swelling, and blisters.  Moisturize feet and legs daily.  Wear shoes that fit properly and have enough cushioning.  If you have foot problems, report any cuts, sores, or bruises to your health care provider immediately.  Schedule a complete foot exam at least once a year (annually) or more often if you have foot problems. This information is not intended to replace advice given to you by your health care provider. Make sure you discuss any questions you have with your health care provider. Document Revised: 01/02/2019 Document Reviewed: 05/13/2016 Elsevier Patient Education  2020 Elsevier Inc.  Edema  Edema is an abnormal buildup of fluids in the body tissues and under the skin. Swelling of the legs, feet, and ankles is a common symptom that becomes more likely as you get older. Swelling is also common in looser tissues, like around the eyes. When the affected area is squeezed, the fluid may move out of that spot and leave a dent for a few moments. This dent is called pitting edema. There are many possible causes of edema. Eating too much salt (sodium) and being on your feet or sitting for a long time can cause edema in your legs, feet, and ankles. Hot weather may make edema worse. Common causes of edema include:  Heart failure.  Liver or kidney disease.  Weak leg blood vessels.  Cancer.  An injury.  Pregnancy.  Medicines.  Being obese.  Low protein levels in the blood. Edema is usually painless. Your skin may look swollen or shiny. Follow these instructions at home:  Keep the affected body part raised (elevated) above the level of your heart when you are sitting or lying down.  Do not sit still or stand for long periods of time.  Do not wear tight clothing. Do not wear garters on your upper legs.  Exercise your legs to get your circulation going. This helps to move the fluid back into your blood vessels, and it may help the swelling go  down.  Wear elastic bandages or support stockings to reduce swelling as told by your health care provider.  Eat a low-salt (low-sodium) diet to reduce fluid as told by your health care provider.  Depending on the cause of your swelling, you may need to limit how much fluid you drink (fluid restriction).  Take over-the-counter and prescription medicines only as told by your health care provider. Contact a health care provider if:  Your edema does not get better with treatment.  You have heart, liver, or kidney disease and have symptoms of edema.  You have sudden and unexplained weight gain. Get help right away if:    You develop shortness of breath or chest pain.  You cannot breathe when you lie down.  You develop pain, redness, or warmth in the swollen areas.  You have heart, liver, or kidney disease and suddenly get edema.  You have a fever and your symptoms suddenly get worse. Summary  Edema is an abnormal buildup of fluids in the body tissues and under the skin.  Eating too much salt (sodium) and being on your feet or sitting for a long time can cause edema in your legs, feet, and ankles.  Keep the affected body part raised (elevated) above the level of your heart when you are sitting or lying down. This information is not intended to replace advice given to you by your health care provider. Make sure you discuss any questions you have with your health care provider. Document Revised: 08/29/2018 Document Reviewed: 05/14/2016 Elsevier Patient Education  2020 Elsevier Inc.  

## 2019-11-27 NOTE — Progress Notes (Signed)
Subjective: Judy English presents today referred by Trey Sailors, PA for diabetic foot evaluation.  Patient relates 14 year history of diabetes.  Patient denies any history of foot wounds.  Patient states she has 14 year history of neuropathy. She takes gabapentin, 600 mg twice daily for management.  Today, patient c/o of painful, discolored, thick toenails which interfere with daily activities.  Pain is aggravated when wearing enclosed shoe gear.   Past Medical History:  Diagnosis Date  . Anxiety   . Asthma   . Depression   . Diabetes mellitus   . Fibromyalgia   . Heart attack ([place])   . Hypertension   . Lupus ([place])   . Morbid obesity ([place])   . Neuropathy     Patient Active Problem List   Diagnosis Date Noted  . DIABETES MELLITUS, TYPE II, WITHOUT COMPLICATIONS 09/81/1914  . MORBID OBESITY 04/28/2008  . DEPRESSION 04/28/2008  . HYPERTENSION, ESSENTIAL 04/28/2008  . ASTHMA, UNSPECIFIED, UNSPECIFIED STATUS 04/28/2008  . GERD 04/28/2008  . DEGENERATIVE JOINT DISEASE, BOTH KNEES, SEVERE 04/28/2008  . SPONDYLOSIS, CERVICAL, WITHOUT MYELOPATHY 04/28/2008  . MYOFASCIAL PAIN SYNDROME 04/28/2008  . OBSTRUCTIVE SLEEP APNEA 05/02/2007    Past Surgical History:  Procedure Laterality Date  . ABDOMINAL EXPLORATION SURGERY    . CESAREAN SECTION    . UTERINE FIBROID SURGERY      Current Outpatient Medications on File Prior to Visit  Medication Sig Dispense Refill  . albuterol (PROVENTIL HFA;VENTOLIN HFA) 108 (90 BASE) MCG/ACT inhaler Inhale 2 puffs into the lungs every 6 (six) hours as needed. For shortness of breath and wheezing     . amLODipine (NORVASC) 5 MG tablet Take 5 mg by mouth daily.    Marland Kitchen aspirin 81 MG chewable tablet Chew 81 mg by mouth daily.    Marland Kitchen atenolol (TENORMIN) 50 MG tablet Take 50 mg by mouth daily.    . BD ULTRA-FINE PEN NEEDLES 29G X 12.7MM MISC USE TO INJECT INSULIN UP TO TID    . buPROPion (WELLBUTRIN XL) 150 MG 24 hr tablet Take 150 mg by  mouth 3 (three) times daily as needed.    Marland Kitchen BYDUREON 2 MG PEN Inject 2 mg as directed once a week.    . celecoxib (CELEBREX) 100 MG capsule     . chlorthalidone (HYGROTON) 25 MG tablet Take 50 mg by mouth daily.    . cyclobenzaprine (FLEXERIL) 10 MG tablet Take 1 tablet (10 mg total) by mouth 2 (two) times daily as needed for muscle spasms. 20 tablet 0  . FLUoxetine (PROZAC) 40 MG capsule Take 40 mg by mouth daily.    . fluticasone (FLONASE) 50 MCG/ACT nasal spray Place 2 sprays into both nostrils daily. 16 g 0  . Fluticasone-Salmeterol (ADVAIR) 500-50 MCG/DOSE AEPB Inhale 1 puff into the lungs every 12 (twelve) hours.      . furosemide (LASIX) 40 MG tablet Take 40 mg by mouth daily.    Marland Kitchen gabapentin (NEURONTIN) 600 MG tablet Take 1 tablet by mouth 3 (three) times daily.    . hydrOXYzine (ATARAX/VISTARIL) 25 MG tablet Take 25 mg by mouth 4 (four) times daily.    . insulin aspart (NOVOLOG) 100 UNIT/ML injection Inject 10 Units into the skin 4 (four) times daily as needed for high blood sugar (sliding scale). 10 mL 2  . Insulin Glargine (TOUJEO SOLOSTAR) 300 UNIT/ML SOPN Inject 90 Units into the skin 2 (two) times daily. 1.5 mL 0  . JANUVIA 100 MG tablet Take 100  mg by mouth daily.    Marland Kitchen LORazepam (ATIVAN) 0.5 MG tablet Take 0.5 mg by mouth 2 (two) times daily as needed for anxiety.    Marland Kitchen omeprazole (PRILOSEC) 40 MG capsule Take 40 mg by mouth daily.    . ondansetron (ZOFRAN) 4 MG tablet Take 1 tablet (4 mg total) by mouth every 6 (six) hours. 12 tablet 0  . oxyCODONE-acetaminophen (PERCOCET) 10-325 MG tablet Take 1 tablet by mouth every 6 (six) hours as needed for pain.    Marland Kitchen sertraline (ZOLOFT) 50 MG tablet Take 1 tablet (50 mg total) by mouth daily. 30 tablet 0  . sodium chloride (OCEAN) 0.65 % SOLN nasal spray Place 1 spray into both nostrils as needed for congestion. 480 mL 0  . triamcinolone lotion (KENALOG) 0.1 % APP EXT AA TID FOR 7 DAYS     No current facility-administered medications on  file prior to visit.     Allergies  Allergen Reactions  . Other   . Acetaminophen Rash  . Ibuprofen Rash  . Latex Rash  . Omnipaque [Iohexol] Itching    Pt has itching with iv contrast, needs premeds prior to exam    Social History   Occupational History  . Not on file  Tobacco Use  . Smoking status: Never Smoker  . Smokeless tobacco: Never Used  Substance and Sexual Activity  . Alcohol use: No  . Drug use: No  . Sexual activity: Not on file    History reviewed. No pertinent family history.   There is no immunization history on file for this patient.  Review of systems: Positive Findings in bold print.  Constitutional:  chills, fatigue, fever, sweats, weight change Communication: Optometrist, sign Ecologist, hand writing, iPad/Android device Head: headaches, head injury Eyes: changes in vision, eye pain, glaucoma, cataracts, macular degeneration, diplopia, glare,  light sensitivity, eyeglasses or contacts, blindness Ears nose mouth throat: hearing impaired, hearing aids,  ringing in ears, deaf, sign language,  vertigo, nosebleeds,  rhinitis,  cold sores, snoring, swollen glands Cardiovascular: HTN, edema, arrhythmia, pacemaker in place, defibrillator in place, chest pain/tightness, chronic anticoagulation, blood clot, heart failure, MI Peripheral Vascular: leg cramps, varicose veins, blood clots, lymphedema, varicosities Respiratory:  difficulty breathing, denies congestion, SOB, wheezing, cough, emphysema Gastrointestinal: change in appetite or weight, abdominal pain, constipation, diarrhea, nausea, vomiting, vomiting blood, change in bowel habits, abdominal pain, jaundice, rectal bleeding, hemorrhoids, GERD Genitourinary:  nocturia,  pain on urination, polyuria,  blood in urine, Foley catheter, urinary urgency, ESRD on hemodialysis Musculoskeletal: amputation, cramping, stiff joints, painful joints, decreased joint motion, fractures, OA, gout, hemiplegia,  paraplegia, uses cane, wheelchair bound, uses walker, uses rollator Skin: changes in toenails, color change, dryness, itching, mole changes,  rash, wound(s) Neurological: headaches, numbness in feet, paresthesias in feet, burning in feet, fainting,  seizures, change in speech,  headaches, memory problems/poor historian, cerebral palsy, weakness, paralysis, CVA, TIA Endocrine: diabetes, hypothyroidism, hyperthyroidism,  goiter, dry mouth, flushing, heat intolerance,  cold intolerance,  excessive thirst, denies polyuria,  nocturia Hematological:  easy bleeding, excessive bleeding, easy bruising, enlarged lymph nodes, on long term blood thinner, history of past transusions Allergy/immunological:  hives, eczema, frequent infections, multiple drug allergies, seasonal allergies, transplant recipient, multiple food allergies Psychiatric:  anxiety, depression, mood disorder, suicidal ideations, hallucinations, insomnia  Objective: Vitals:   11/26/19 1609  BP: (!) 173/109  Pulse: 96  Temp: (!) 95.3 F (35.2 C)   Inza Mikrut is a pleasant 59 y.o. female morbidly obese in NAD.Marland Kitchen AAO  X 3.  Vascular Examination: Capillary refill time to digits immediate b/l. Palpable pedal pulses b/l LE. Pedal hair absent. Lower extremity skin temperature gradient within normal limits. No pain with calf compression b/l. Nonpitting edema noted b/l lower extremities.  Dermatological Examination: Pedal skin with normal turgor, texture and tone bilaterally. No open wounds bilaterally. No interdigital macerations bilaterally. Toenails 1-5 b/l elongated, discolored, dystrophic, thickened, crumbly with subungual debris and tenderness to dorsal palpation.  Musculoskeletal Examination: Normal muscle strength 5/5 to all lower extremity muscle groups bilaterally. No pain crepitus or joint limitation noted with ROM b/l. Hammertoes noted to the L 5th toe and R 5th toe. Pes planus deformity noted b/l.   Footwear  Assessment: Does the patient wear appropriate shoes? Yes. Does the patient need inserts/orthotics? She may benefit due to pes planus deformity.  Neurological Examination: Protective sensation intact 5/5 intact bilaterally with 10g monofilament b/l. Vibratory sensation intact b/l. Proprioception intact bilaterally. Deep tendon reflexes normal b/l.  Clonus negative b/l.  Assessment: 1. Pain due to onychomycosis of toenails of both feet   2. Pes planus of both feet   3. Acquired hammertoes of both feet   4. Encounter for diabetic foot exam ([place])   5. Diabetic peripheral neuropathy associated with type 2 diabetes mellitus ([facility])     ADA Risk Categorization:  Low Risk:  Patient has all of the following: Intact protective sensation No prior foot ulcer  No severe deformity Pedal pulses present  Plan: -Examined patient. -Diabetic foot examination performed on today's visit. -Discussed diabetic foot care principles. Literature dispensed on today. -Toenails 1-5 b/l were debrided in length and girth with sterile nail nippers and dremel without iatrogenic bleeding.  -Patient to report any pedal injuries to medical professional immediately. -Patient to continue soft, supportive shoe gear daily. -Patient/POA to call should there be question/concern in the interim.  Return in about 3 months (around 02/26/2020).

## 2019-12-10 ENCOUNTER — Telehealth (HOSPITAL_COMMUNITY): Payer: Medicaid Other | Admitting: Psychiatry

## 2019-12-16 ENCOUNTER — Ambulatory Visit (HOSPITAL_COMMUNITY): Payer: Medicaid Other | Admitting: Licensed Clinical Social Worker

## 2019-12-27 ENCOUNTER — Encounter (HOSPITAL_COMMUNITY): Payer: Self-pay

## 2019-12-27 ENCOUNTER — Emergency Department (HOSPITAL_COMMUNITY): Payer: Medicaid Other

## 2019-12-27 ENCOUNTER — Encounter (HOSPITAL_COMMUNITY): Admission: EM | Disposition: E | Payer: Self-pay | Source: Home / Self Care | Attending: Neurology

## 2019-12-27 ENCOUNTER — Inpatient Hospital Stay (HOSPITAL_COMMUNITY): Payer: Medicaid Other

## 2019-12-27 ENCOUNTER — Inpatient Hospital Stay (HOSPITAL_COMMUNITY)
Admission: EM | Admit: 2019-12-27 | Discharge: 2020-01-24 | DRG: 853 | Disposition: E | Payer: Medicaid Other | Attending: Neurology | Admitting: Neurology

## 2019-12-27 ENCOUNTER — Other Ambulatory Visit: Payer: Self-pay

## 2019-12-27 ENCOUNTER — Emergency Department (HOSPITAL_COMMUNITY): Payer: Medicaid Other | Admitting: Registered Nurse

## 2019-12-27 DIAGNOSIS — J1282 Pneumonia due to coronavirus disease 2019: Secondary | ICD-10-CM | POA: Diagnosis present

## 2019-12-27 DIAGNOSIS — I2510 Atherosclerotic heart disease of native coronary artery without angina pectoris: Secondary | ICD-10-CM | POA: Diagnosis present

## 2019-12-27 DIAGNOSIS — R208 Other disturbances of skin sensation: Secondary | ICD-10-CM | POA: Diagnosis present

## 2019-12-27 DIAGNOSIS — M329 Systemic lupus erythematosus, unspecified: Secondary | ICD-10-CM | POA: Diagnosis present

## 2019-12-27 DIAGNOSIS — I6602 Occlusion and stenosis of left middle cerebral artery: Secondary | ICD-10-CM | POA: Diagnosis present

## 2019-12-27 DIAGNOSIS — Z4659 Encounter for fitting and adjustment of other gastrointestinal appliance and device: Secondary | ICD-10-CM | POA: Diagnosis not present

## 2019-12-27 DIAGNOSIS — R414 Neurologic neglect syndrome: Secondary | ICD-10-CM | POA: Diagnosis present

## 2019-12-27 DIAGNOSIS — K219 Gastro-esophageal reflux disease without esophagitis: Secondary | ICD-10-CM | POA: Diagnosis present

## 2019-12-27 DIAGNOSIS — R4701 Aphasia: Secondary | ICD-10-CM | POA: Diagnosis present

## 2019-12-27 DIAGNOSIS — Z6841 Body Mass Index (BMI) 40.0 and over, adult: Secondary | ICD-10-CM

## 2019-12-27 DIAGNOSIS — E1140 Type 2 diabetes mellitus with diabetic neuropathy, unspecified: Secondary | ICD-10-CM | POA: Diagnosis present

## 2019-12-27 DIAGNOSIS — F419 Anxiety disorder, unspecified: Secondary | ICD-10-CM | POA: Diagnosis present

## 2019-12-27 DIAGNOSIS — E1165 Type 2 diabetes mellitus with hyperglycemia: Secondary | ICD-10-CM | POA: Diagnosis not present

## 2019-12-27 DIAGNOSIS — U071 COVID-19: Secondary | ICD-10-CM | POA: Diagnosis present

## 2019-12-27 DIAGNOSIS — H518 Other specified disorders of binocular movement: Secondary | ICD-10-CM | POA: Diagnosis present

## 2019-12-27 DIAGNOSIS — J45909 Unspecified asthma, uncomplicated: Secondary | ICD-10-CM | POA: Diagnosis present

## 2019-12-27 DIAGNOSIS — Z91040 Latex allergy status: Secondary | ICD-10-CM

## 2019-12-27 DIAGNOSIS — E872 Acidosis: Secondary | ICD-10-CM | POA: Diagnosis present

## 2019-12-27 DIAGNOSIS — I1310 Hypertensive heart and chronic kidney disease without heart failure, with stage 1 through stage 4 chronic kidney disease, or unspecified chronic kidney disease: Secondary | ICD-10-CM | POA: Diagnosis present

## 2019-12-27 DIAGNOSIS — Z9911 Dependence on respirator [ventilator] status: Secondary | ICD-10-CM | POA: Diagnosis not present

## 2019-12-27 DIAGNOSIS — I63312 Cerebral infarction due to thrombosis of left middle cerebral artery: Secondary | ICD-10-CM

## 2019-12-27 DIAGNOSIS — R6521 Severe sepsis with septic shock: Secondary | ICD-10-CM | POA: Diagnosis present

## 2019-12-27 DIAGNOSIS — E875 Hyperkalemia: Secondary | ICD-10-CM | POA: Diagnosis present

## 2019-12-27 DIAGNOSIS — Z781 Physical restraint status: Secondary | ICD-10-CM

## 2019-12-27 DIAGNOSIS — F329 Major depressive disorder, single episode, unspecified: Secondary | ICD-10-CM | POA: Diagnosis present

## 2019-12-27 DIAGNOSIS — I252 Old myocardial infarction: Secondary | ICD-10-CM

## 2019-12-27 DIAGNOSIS — I639 Cerebral infarction, unspecified: Secondary | ICD-10-CM | POA: Diagnosis present

## 2019-12-27 DIAGNOSIS — M797 Fibromyalgia: Secondary | ICD-10-CM | POA: Diagnosis present

## 2019-12-27 DIAGNOSIS — G4733 Obstructive sleep apnea (adult) (pediatric): Secondary | ICD-10-CM | POA: Diagnosis present

## 2019-12-27 DIAGNOSIS — E785 Hyperlipidemia, unspecified: Secondary | ICD-10-CM | POA: Diagnosis present

## 2019-12-27 DIAGNOSIS — R29722 NIHSS score 22: Secondary | ICD-10-CM | POA: Diagnosis present

## 2019-12-27 DIAGNOSIS — R339 Retention of urine, unspecified: Secondary | ICD-10-CM | POA: Diagnosis present

## 2019-12-27 DIAGNOSIS — E1122 Type 2 diabetes mellitus with diabetic chronic kidney disease: Secondary | ICD-10-CM | POA: Diagnosis present

## 2019-12-27 DIAGNOSIS — Z8673 Personal history of transient ischemic attack (TIA), and cerebral infarction without residual deficits: Secondary | ICD-10-CM | POA: Diagnosis not present

## 2019-12-27 DIAGNOSIS — E782 Mixed hyperlipidemia: Secondary | ICD-10-CM | POA: Diagnosis not present

## 2019-12-27 DIAGNOSIS — Z91041 Radiographic dye allergy status: Secondary | ICD-10-CM

## 2019-12-27 DIAGNOSIS — Z888 Allergy status to other drugs, medicaments and biological substances status: Secondary | ICD-10-CM

## 2019-12-27 DIAGNOSIS — R197 Diarrhea, unspecified: Secondary | ICD-10-CM | POA: Diagnosis present

## 2019-12-27 DIAGNOSIS — J9691 Respiratory failure, unspecified with hypoxia: Secondary | ICD-10-CM | POA: Diagnosis not present

## 2019-12-27 DIAGNOSIS — A4189 Other specified sepsis: Secondary | ICD-10-CM | POA: Diagnosis present

## 2019-12-27 DIAGNOSIS — G8191 Hemiplegia, unspecified affecting right dominant side: Secondary | ICD-10-CM | POA: Diagnosis present

## 2019-12-27 DIAGNOSIS — N1832 Chronic kidney disease, stage 3b: Secondary | ICD-10-CM | POA: Diagnosis present

## 2019-12-27 DIAGNOSIS — J9601 Acute respiratory failure with hypoxia: Secondary | ICD-10-CM | POA: Diagnosis present

## 2019-12-27 DIAGNOSIS — I469 Cardiac arrest, cause unspecified: Secondary | ICD-10-CM | POA: Diagnosis not present

## 2019-12-27 DIAGNOSIS — Z20822 Contact with and (suspected) exposure to covid-19: Secondary | ICD-10-CM

## 2019-12-27 DIAGNOSIS — I959 Hypotension, unspecified: Secondary | ICD-10-CM

## 2019-12-27 DIAGNOSIS — Z7982 Long term (current) use of aspirin: Secondary | ICD-10-CM

## 2019-12-27 DIAGNOSIS — I6389 Other cerebral infarction: Secondary | ICD-10-CM | POA: Diagnosis not present

## 2019-12-27 DIAGNOSIS — J9690 Respiratory failure, unspecified, unspecified whether with hypoxia or hypercapnia: Secondary | ICD-10-CM

## 2019-12-27 HISTORY — PX: RADIOLOGY WITH ANESTHESIA: SHX6223

## 2019-12-27 HISTORY — PX: IR PERCUTANEOUS ART THROMBECTOMY/INFUSION INTRACRANIAL INC DIAG ANGIO: IMG6087

## 2019-12-27 HISTORY — PX: IR PTA INTRACRANIAL: IMG2344

## 2019-12-27 HISTORY — PX: IR CT HEAD LTD: IMG2386

## 2019-12-27 LAB — DIFFERENTIAL
Abs Immature Granulocytes: 0.03 10*3/uL (ref 0.00–0.07)
Basophils Absolute: 0 10*3/uL (ref 0.0–0.1)
Basophils Relative: 0 %
Eosinophils Absolute: 0 10*3/uL (ref 0.0–0.5)
Eosinophils Relative: 0 %
Immature Granulocytes: 1 %
Lymphocytes Relative: 11 %
Lymphs Abs: 0.6 10*3/uL — ABNORMAL LOW (ref 0.7–4.0)
Monocytes Absolute: 0.2 10*3/uL (ref 0.1–1.0)
Monocytes Relative: 3 %
Neutro Abs: 4.5 10*3/uL (ref 1.7–7.7)
Neutrophils Relative %: 85 %

## 2019-12-27 LAB — CBC
HCT: 37.8 % (ref 36.0–46.0)
Hemoglobin: 12.5 g/dL (ref 12.0–15.0)
MCH: 27.1 pg (ref 26.0–34.0)
MCHC: 33.1 g/dL (ref 30.0–36.0)
MCV: 81.8 fL (ref 80.0–100.0)
Platelets: 138 10*3/uL — ABNORMAL LOW (ref 150–400)
RBC: 4.62 MIL/uL (ref 3.87–5.11)
RDW: 12 % (ref 11.5–15.5)
WBC: 5.2 10*3/uL (ref 4.0–10.5)
nRBC: 0 % (ref 0.0–0.2)

## 2019-12-27 LAB — GLUCOSE, CAPILLARY
Glucose-Capillary: 412 mg/dL — ABNORMAL HIGH (ref 70–99)
Glucose-Capillary: 396 mg/dL — ABNORMAL HIGH (ref 70–99)

## 2019-12-27 LAB — I-STAT CHEM 8, ED
BUN: 42 mg/dL — ABNORMAL HIGH (ref 6–20)
Calcium, Ion: 0.98 mmol/L — ABNORMAL LOW (ref 1.15–1.40)
Chloride: 109 mmol/L (ref 98–111)
Creatinine, Ser: 1.9 mg/dL — ABNORMAL HIGH (ref 0.44–1.00)
Glucose, Bld: 330 mg/dL — ABNORMAL HIGH (ref 70–99)
HCT: 37 % (ref 36.0–46.0)
Hemoglobin: 12.6 g/dL (ref 12.0–15.0)
Potassium: 5 mmol/L (ref 3.5–5.1)
Sodium: 136 mmol/L (ref 135–145)
TCO2: 18 mmol/L — ABNORMAL LOW (ref 22–32)

## 2019-12-27 LAB — COMPREHENSIVE METABOLIC PANEL
ALT: 20 U/L (ref 0–44)
AST: 63 U/L — ABNORMAL HIGH (ref 15–41)
Albumin: 3 g/dL — ABNORMAL LOW (ref 3.5–5.0)
Alkaline Phosphatase: 54 U/L (ref 38–126)
Anion gap: 12 (ref 5–15)
BUN: 41 mg/dL — ABNORMAL HIGH (ref 6–20)
CO2: 17 mmol/L — ABNORMAL LOW (ref 22–32)
Calcium: 8.3 mg/dL — ABNORMAL LOW (ref 8.9–10.3)
Chloride: 104 mmol/L (ref 98–111)
Creatinine, Ser: 1.98 mg/dL — ABNORMAL HIGH (ref 0.44–1.00)
GFR calc Af Amer: 32 mL/min — ABNORMAL LOW (ref >60)
GFR calc non Af Amer: 27 mL/min — ABNORMAL LOW (ref >60)
Glucose, Bld: 354 mg/dL — ABNORMAL HIGH (ref 70–99)
Potassium: 4.9 mmol/L (ref 3.5–5.1)
Sodium: 133 mmol/L — ABNORMAL LOW (ref 135–145)
Total Bilirubin: 0.7 mg/dL (ref 0.3–1.2)
Total Protein: 7.3 g/dL (ref 6.5–8.1)

## 2019-12-27 LAB — PROTIME-INR
INR: 1.2 (ref 0.8–1.2)
Prothrombin Time: 14.8 seconds (ref 11.4–15.2)

## 2019-12-27 LAB — LACTIC ACID, PLASMA: Lactic Acid, Venous: 2 mmol/L (ref 0.5–1.9)

## 2019-12-27 LAB — I-STAT BETA HCG BLOOD, ED (MC, WL, AP ONLY): I-stat hCG, quantitative: <5 m[IU]/mL (ref <5)

## 2019-12-27 LAB — SARS CORONAVIRUS 2 BY RT PCR (HOSPITAL ORDER, PERFORMED IN ~~LOC~~ HOSPITAL LAB): SARS Coronavirus 2: POSITIVE — AB

## 2019-12-27 LAB — CBG MONITORING, ED: Glucose-Capillary: 300 mg/dL — ABNORMAL HIGH (ref 70–99)

## 2019-12-27 LAB — ETHANOL: Alcohol, Ethyl (B): <10 mg/dL (ref <10)

## 2019-12-27 LAB — APTT: aPTT: 22 seconds — ABNORMAL LOW (ref 24–36)

## 2019-12-27 LAB — TROPONIN I (HIGH SENSITIVITY): Troponin I (High Sensitivity): 24 ng/L — ABNORMAL HIGH (ref <18)

## 2019-12-27 SURGERY — IR WITH ANESTHESIA
Anesthesia: General

## 2019-12-27 MED ORDER — SUGAMMADEX SODIUM 200 MG/2ML IV SOLN
INTRAVENOUS | Status: DC | PRN
  Administered 2019-12-27: 300 mg via INTRAVENOUS

## 2019-12-27 MED ORDER — IOHEXOL 300 MG/ML  SOLN
150.0000 mL | Freq: Once | INTRAMUSCULAR | Status: AC | PRN
  Administered 2019-12-27: 75 mL via INTRA_ARTERIAL

## 2019-12-27 MED ORDER — DIPHENHYDRAMINE HCL 25 MG PO CAPS: 50.0000 mg | ORAL_CAPSULE | Freq: Once | ORAL | Status: AC

## 2019-12-27 MED ORDER — SENNOSIDES-DOCUSATE SODIUM 8.6-50 MG PO TABS: 1.0000 | ORAL_TABLET | Freq: Every evening | ORAL | Status: DC | PRN

## 2019-12-27 MED ORDER — DEXAMETHASONE 6 MG PO TABS
6.0000 mg | ORAL_TABLET | Freq: Every day | ORAL | Status: DC
  Administered 2019-12-28: 6 mg
  Filled 2019-12-27: qty 1

## 2019-12-27 MED ORDER — IOHEXOL 350 MG/ML SOLN
100.0000 mL | Freq: Once | INTRAVENOUS | Status: AC | PRN
  Administered 2019-12-27: 100 mL via INTRAVENOUS

## 2019-12-27 MED ORDER — ORAL CARE MOUTH RINSE
15.0000 mL | OROMUCOSAL | Status: DC
  Administered 2019-12-27 – 2019-12-28 (×10): 15 mL via OROMUCOSAL

## 2019-12-27 MED ORDER — ACETAMINOPHEN 650 MG RE SUPP: 650.0000 mg | RECTAL | Status: DC | PRN

## 2019-12-27 MED ORDER — PROPOFOL 1000 MG/100ML IV EMUL
5.0000 ug/kg/min | INTRAVENOUS | Status: DC
  Administered 2019-12-27 – 2019-12-28 (×2): 60 ug/kg/min via INTRAVENOUS
  Administered 2019-12-28: 50 ug/kg/min via INTRAVENOUS
  Administered 2019-12-28 (×3): 60 ug/kg/min via INTRAVENOUS
  Filled 2019-12-27: qty 200
  Filled 2019-12-27 (×6): qty 100

## 2019-12-27 MED ORDER — LACTATED RINGERS IV BOLUS (SEPSIS): 1000.0000 mL | Freq: Once | INTRAVENOUS | Status: DC

## 2019-12-27 MED ORDER — SODIUM CHLORIDE 0.9 % IV SOLN: INTRAVENOUS | Status: DC

## 2019-12-27 MED ORDER — SODIUM CHLORIDE 0.9 % IV SOLN
2.0000 g | Freq: Once | INTRAVENOUS | Status: AC
  Administered 2019-12-27: 2 g via INTRAVENOUS
  Filled 2019-12-27: qty 2

## 2019-12-27 MED ORDER — SODIUM CHLORIDE (PF) 0.9 % IJ SOLN
INTRAVENOUS | Status: AC | PRN
  Administered 2019-12-27 (×4): 25 ug via INTRA_ARTERIAL

## 2019-12-27 MED ORDER — NITROGLYCERIN 1 MG/10 ML FOR IR/CATH LAB
INTRA_ARTERIAL | Status: AC
  Filled 2019-12-27: qty 10

## 2019-12-27 MED ORDER — TIROFIBAN HCL IN NACL 5-0.9 MG/100ML-% IV SOLN
INTRAVENOUS | Status: AC
  Filled 2019-12-27: qty 100

## 2019-12-27 MED ORDER — IOHEXOL 300 MG/ML  SOLN
50.0000 mL | Freq: Once | INTRAMUSCULAR | Status: AC | PRN
  Administered 2019-12-27: 25 mL via INTRA_ARTERIAL

## 2019-12-27 MED ORDER — PROPOFOL 500 MG/50ML IV EMUL
INTRAVENOUS | Status: DC | PRN
  Administered 2019-12-27: 40 ug/kg/min via INTRAVENOUS

## 2019-12-27 MED ORDER — SODIUM CHLORIDE 0.9 % IV SOLN
200.0000 mg | Freq: Once | INTRAVENOUS | Status: AC
  Administered 2019-12-28: 200 mg via INTRAVENOUS
  Filled 2019-12-27: qty 40

## 2019-12-27 MED ORDER — METRONIDAZOLE IN NACL 5-0.79 MG/ML-% IV SOLN
500.0000 mg | Freq: Once | INTRAVENOUS | Status: DC
  Administered 2019-12-27: 500 mg via INTRAVENOUS
  Filled 2019-12-27: qty 100

## 2019-12-27 MED ORDER — LACTATED RINGERS IV SOLN: INTRAVENOUS | Status: DC | PRN

## 2019-12-27 MED ORDER — PHENYLEPHRINE HCL-NACL 10-0.9 MG/250ML-% IV SOLN
INTRAVENOUS | Status: DC | PRN
  Administered 2019-12-27: 10 ug/min via INTRAVENOUS

## 2019-12-27 MED ORDER — CEFAZOLIN SODIUM-DEXTROSE 2-4 GM/100ML-% IV SOLN
INTRAVENOUS | Status: AC
  Filled 2019-12-27: qty 200

## 2019-12-27 MED ORDER — CHLORHEXIDINE GLUCONATE 0.12% ORAL RINSE (MEDLINE KIT)
15.0000 mL | Freq: Two times a day (BID) | OROMUCOSAL | Status: DC
  Administered 2019-12-27 – 2019-12-28 (×3): 15 mL via OROMUCOSAL

## 2019-12-27 MED ORDER — CLOPIDOGREL BISULFATE 300 MG PO TABS
ORAL_TABLET | ORAL | Status: AC
  Filled 2019-12-27: qty 1

## 2019-12-27 MED ORDER — PROPOFOL 10 MG/ML IV BOLUS
INTRAVENOUS | Status: DC | PRN
  Administered 2019-12-27: 110 mg via INTRAVENOUS

## 2019-12-27 MED ORDER — ASPIRIN 81 MG PO CHEW
CHEWABLE_TABLET | ORAL | Status: AC
  Filled 2019-12-27: qty 1

## 2019-12-27 MED ORDER — ROCURONIUM BROMIDE 10 MG/ML (PF) SYRINGE
PREFILLED_SYRINGE | INTRAVENOUS | Status: DC | PRN
  Administered 2019-12-27: 50 mg via INTRAVENOUS
  Administered 2019-12-27: 100 mg via INTRAVENOUS
  Administered 2019-12-27: 50 mg via INTRAVENOUS

## 2019-12-27 MED ORDER — LABETALOL HCL 5 MG/ML IV SOLN
INTRAVENOUS | Status: DC | PRN
  Administered 2019-12-27 (×2): 5 mg via INTRAVENOUS

## 2019-12-27 MED ORDER — CLEVIDIPINE BUTYRATE 0.5 MG/ML IV EMUL
0.0000 mg/h | INTRAVENOUS | Status: AC
  Administered 2019-12-28: 8 mg/h via INTRAVENOUS
  Administered 2019-12-28: 2 mg/h via INTRAVENOUS
  Administered 2019-12-28: 4 mg/h via INTRAVENOUS
  Administered 2019-12-28: 16 mg/h via INTRAVENOUS
  Administered 2019-12-28: 1 mg/h via INTRAVENOUS
  Filled 2019-12-27 (×2): qty 50
  Filled 2019-12-27: qty 100
  Filled 2019-12-27 (×4): qty 50

## 2019-12-27 MED ORDER — SODIUM CHLORIDE (PF) 0.9 % IJ SOLN
INTRAVENOUS | Status: AC | PRN
  Administered 2019-12-27 (×2): 25 ug via INTRA_ARTERIAL

## 2019-12-27 MED ORDER — METRONIDAZOLE IN NACL 5-0.79 MG/ML-% IV SOLN
500.0000 mg | Freq: Once | INTRAVENOUS | Status: DC
  Filled 2019-12-27: qty 100

## 2019-12-27 MED ORDER — DIPHENHYDRAMINE HCL 50 MG/ML IJ SOLN: 50.0000 mg | Freq: Once | INTRAMUSCULAR | Status: AC

## 2019-12-27 MED ORDER — LIDOCAINE 2% (20 MG/ML) 5 ML SYRINGE
INTRAMUSCULAR | Status: DC | PRN
  Administered 2019-12-27: 60 mg via INTRAVENOUS

## 2019-12-27 MED ORDER — SODIUM CHLORIDE 0.9 % IV SOLN: INTRAVENOUS | Status: DC | PRN

## 2019-12-27 MED ORDER — FENTANYL CITRATE (PF) 100 MCG/2ML IJ SOLN
25.0000 ug | INTRAMUSCULAR | Status: DC | PRN
  Administered 2019-12-27: 50 ug via INTRAVENOUS
  Administered 2019-12-28 (×3): 100 ug via INTRAVENOUS
  Filled 2019-12-27 (×4): qty 2

## 2019-12-27 MED ORDER — FENTANYL CITRATE (PF) 100 MCG/2ML IJ SOLN
INTRAMUSCULAR | Status: AC
  Filled 2019-12-27: qty 2

## 2019-12-27 MED ORDER — EPTIFIBATIDE 20 MG/10ML IV SOLN
INTRAVENOUS | Status: AC
  Filled 2019-12-27: qty 10

## 2019-12-27 MED ORDER — SODIUM CHLORIDE 0.9 % IV BOLUS: 1000.0000 mL | Freq: Once | INTRAVENOUS | Status: DC

## 2019-12-27 MED ORDER — TICAGRELOR 90 MG PO TABS
ORAL_TABLET | ORAL | Status: AC
  Filled 2019-12-27: qty 2

## 2019-12-27 MED ORDER — VANCOMYCIN HCL IN DEXTROSE 1-5 GM/200ML-% IV SOLN: 1000.0000 mg | Freq: Once | INTRAVENOUS | Status: DC

## 2019-12-27 MED ORDER — VERAPAMIL HCL 2.5 MG/ML IV SOLN
INTRAVENOUS | Status: AC
  Filled 2019-12-27: qty 2

## 2019-12-27 MED ORDER — DIPHENHYDRAMINE HCL 50 MG/ML IJ SOLN
INTRAMUSCULAR | Status: AC
  Administered 2019-12-27: 50 mg via INTRAVENOUS
  Filled 2019-12-27: qty 1

## 2019-12-27 MED ORDER — INSULIN ASPART 100 UNIT/ML ~~LOC~~ SOLN
0.0000 [IU] | SUBCUTANEOUS | Status: DC
  Administered 2019-12-27: 20 [IU] via SUBCUTANEOUS
  Administered 2019-12-28: 24 [IU] via SUBCUTANEOUS

## 2019-12-27 MED ORDER — HYDROCORTISONE NA SUCCINATE PF 250 MG IJ SOLR
200.0000 mg | Freq: Once | INTRAMUSCULAR | Status: AC
  Administered 2019-12-27: 200 mg via INTRAVENOUS
  Filled 2019-12-27: qty 200

## 2019-12-27 MED ORDER — SUCCINYLCHOLINE CHLORIDE 200 MG/10ML IV SOSY
PREFILLED_SYRINGE | INTRAVENOUS | Status: DC | PRN
  Administered 2019-12-27: 180 mg via INTRAVENOUS

## 2019-12-27 MED ORDER — FENTANYL CITRATE (PF) 250 MCG/5ML IJ SOLN
INTRAMUSCULAR | Status: DC | PRN
Start: 2019-12-27 — End: 2019-12-27
  Administered 2019-12-27 (×2): 100 ug via INTRAVENOUS

## 2019-12-27 MED ORDER — INSULIN DETEMIR 100 UNIT/ML ~~LOC~~ SOLN
40.0000 [IU] | Freq: Two times a day (BID) | SUBCUTANEOUS | Status: DC
  Administered 2019-12-27 – 2019-12-28 (×2): 40 [IU] via SUBCUTANEOUS
  Filled 2019-12-27 (×3): qty 0.4

## 2019-12-27 MED ORDER — EPTIFIBATIDE 20 MG/10ML IV SOLN
INTRAVENOUS | Status: AC | PRN
  Administered 2019-12-27 (×6): 1.5 mg via INTRAVENOUS

## 2019-12-27 MED ORDER — BARICITINIB 2 MG PO TABS
2.0000 mg | ORAL_TABLET | Freq: Every day | ORAL | Status: DC
  Filled 2019-12-27: qty 1

## 2019-12-27 MED ORDER — ACETAMINOPHEN 325 MG PO TABS: 650.0000 mg | ORAL_TABLET | ORAL | Status: DC | PRN

## 2019-12-27 MED ORDER — STROKE: EARLY STAGES OF RECOVERY BOOK
Freq: Once | Status: DC
  Filled 2019-12-27: qty 1

## 2019-12-27 MED ORDER — ACETAMINOPHEN 160 MG/5ML PO SOLN: 650.0000 mg | ORAL | Status: DC | PRN

## 2019-12-27 MED ORDER — SODIUM CHLORIDE 0.9 % IV SOLN
2.0000 g | Freq: Two times a day (BID) | INTRAVENOUS | Status: DC
  Administered 2019-12-28 (×2): 2 g via INTRAVENOUS
  Filled 2019-12-27 (×3): qty 2

## 2019-12-27 MED ORDER — VANCOMYCIN HCL 1500 MG/300ML IV SOLN
1500.0000 mg | INTRAVENOUS | Status: DC
  Administered 2019-12-27: 1500 mg via INTRAVENOUS
  Filled 2019-12-27 (×2): qty 300

## 2019-12-27 MED ORDER — LACTATED RINGERS IV SOLN: INTRAVENOUS | Status: AC

## 2019-12-27 MED ORDER — IOHEXOL 300 MG/ML  SOLN
150.0000 mL | Freq: Once | INTRAMUSCULAR | Status: AC | PRN
  Administered 2019-12-27: 40 mL via INTRA_ARTERIAL

## 2019-12-27 MED ORDER — ESMOLOL HCL 100 MG/10ML IV SOLN
INTRAVENOUS | Status: DC | PRN
  Administered 2019-12-27: 20 mg via INTRAVENOUS

## 2019-12-27 MED ORDER — FENTANYL CITRATE (PF) 100 MCG/2ML IJ SOLN
INTRAMUSCULAR | Status: AC
Start: 2019-12-27 — End: 2019-12-27
  Filled 2019-12-27: qty 2

## 2019-12-27 MED ORDER — DEXTROSE 5 % IV SOLN
INTRAVENOUS | Status: DC | PRN
  Administered 2019-12-27: 3 g via INTRAVENOUS

## 2019-12-27 MED ORDER — SODIUM CHLORIDE 0.9 % IV SOLN: 100.0000 mg | Freq: Every day | INTRAVENOUS | Status: DC

## 2019-12-27 MED ORDER — PHENYLEPHRINE 40 MCG/ML (10ML) SYRINGE FOR IV PUSH (FOR BLOOD PRESSURE SUPPORT)
PREFILLED_SYRINGE | INTRAVENOUS | Status: DC | PRN
  Administered 2019-12-27 (×2): 10 ug via INTRAVENOUS

## 2019-12-27 NOTE — Progress Notes (Signed)
Pt intubated and sedated, pt under the care of anesthesia at this time.

## 2019-12-27 NOTE — Procedures (Signed)
S/P Lt common carotid arteriogram followed by rebvascularization of occluded Lt MCA M 1 seg with x 2 paasses with Tiger21 retriver and x 1 pass with SolitaireX 101mmx 40 mm retriever and aspiration achieving a TICI 3 revascularization nd rescue angioplasty of M1 seg and prox inferior division achieving a TICI 2C revascularization. Post procedure CT brain NO ICH or mass effect seen . Contrast contrast noted in the ant parietal cerebral convexity. 69F angioseal closure device for hemostasis in the RT groin puncture. Distal pulses all dopplerable. Patient left intubated due to Covid positivity. S.Sina Sumpter MD

## 2019-12-27 NOTE — Anesthesia Procedure Notes (Signed)
Arterial Line Insertion Start/End9/06/2019 4:35 PM Performed by: Lance Coon, CRNA, CRNA  Patient location: OOR procedure area. Preanesthetic checklist: patient identified, IV checked, site marked, risks and benefits discussed, surgical consent, monitors and equipment checked, pre-op evaluation, timeout performed and anesthesia consent Patient sedated Left, radial was placed Catheter size: 20 G Hand hygiene performed  and maximum sterile barriers used   Attempts: 1 Procedure performed without using ultrasound guided technique. Following insertion, dressing applied and Biopatch. Post procedure assessment: normal  Patient tolerated the procedure well with no immediate complications.

## 2019-12-27 NOTE — Anesthesia Procedure Notes (Signed)
Procedure Name: Intubation Date/Time: 01/05/2020 4:26 PM Performed by: Trinna Post., CRNA Pre-anesthesia Checklist: Patient identified, Emergency Drugs available, Suction available, Patient being monitored and Timeout performed Patient Re-evaluated:Patient Re-evaluated prior to induction Oxygen Delivery Method: Circle system utilized Preoxygenation: Pre-oxygenation with 100% oxygen Induction Type: IV induction and Rapid sequence Laryngoscope Size: Glidescope and 4 Grade View: Grade I Tube type: Oral Tube size: 7.5 mm Number of attempts: 1 Airway Equipment and Method: Rigid stylet and Video-laryngoscopy Placement Confirmation: ETT inserted through vocal cords under direct vision,  positive ETCO2 and breath sounds checked- equal and bilateral Secured at: 22 cm Tube secured with: Tape Dental Injury: Teeth and Oropharynx as per pre-operative assessment

## 2019-12-27 NOTE — Progress Notes (Signed)
Pharmacy Antibiotic Note  Judy English is a 59 y.o. female admitted on 01/12/2020 with sepsis.  Pharmacy has been consulted for vancomycin and cefepime dosing.  Patient is lethargic and has GI symptoms x1 week. Recently exposed to [place]. Afebrile with O2 sat 80s on RA. Lactic acid is 2. Scr 1.9, CrCl ~43.    Plan: Give vancomycin 2,500mg  x1 Initiate vancomycin 1,500mg  IV every 24 hours.  Goal trough 15-20 mcg/mL. Cefepime 2g IV every 12 hours Follow up with cultures, antibiotic de-escalation, LOT and vanc trough if needed Monitor renal function and clinical progress   Temp (24hrs), Avg:99.5 F (37.5 C), Min:99.5 F (37.5 C), Max:99.5 F (37.5 C)  No results for input(s): WBC, CREATININE, LATICACIDVEN, VANCOTROUGH, VANCOPEAK, VANCORANDOM, GENTTROUGH, GENTPEAK, GENTRANDOM, TOBRATROUGH, TOBRAPEAK, TOBRARND, AMIKACINPEAK, AMIKACINTROU, AMIKACIN in the last 168 hours.  CrCl cannot be calculated (Patient's most recent lab result is older than the maximum 21 days allowed.).    Allergies  Allergen Reactions   Other    Acetaminophen Rash   Ibuprofen Rash   Latex Rash   Omnipaque [Iohexol] Itching    Pt has itching with iv contrast, needs premeds prior to exam    Antimicrobials this admission: 9/2 vancomycin >>  9/2 cefepime >>  9/2 metronidazole x1   Dose adjustments this admission: N/a  Microbiology results: 9/2 BCx: pending 9/2 UCx: pending 9/2 COVID: pending  Thank you for allowing pharmacy to be a part of this patients care.  Mercy Riding, PharmD PGY1 Acute Care Pharmacy Resident Please refer to [facility] for unit-specific pharmacist

## 2019-12-27 NOTE — ED Notes (Signed)
Going to IR 

## 2019-12-27 NOTE — Code Documentation (Addendum)
Stroke Response Nurse Documentation Code Documentation  Judy English is a 59 y.o. female arriving to [place] ED via [facility] on 01/07/2020 with past medical hx of lupus, diabetes, HTN, and HLD. Code stroke was activated by ED after patient was noted to have left gaze and right sided weakness. LKW was thought to be greater than 24 hours when her son saw her, but he spoke with her on the phone around 1039 this morning and she was noted to be normal. Decision was made to proceed forward with Code Stroke at this time. Patient from home where she was found down by EMS. She has had recent COVID exposure due to her husband and reports having three days worth of diarrhea.   Labs drawn and patient cleared for CT by Dr. Regenia Skeeter. Patient to CT with team. NIHSS 22, see documentation for details and code stroke times. Patient with disoriented, not following commands, left gaze preference , right hemianopia, right facial droop, right arm weakness, bilateral leg weakness, Global aphasia  and dysarthria  on exam. The following imaging was completed:  CT, CTA head and neck, CTP. Patient is not a candidate for tPA due to being outside the window.   Pt was deemed IR candidate due to a large vessel occlusion being noted on her CTA per MD. Pt taken to [place] [facility]. Groin shaved and patient placed on anesthesia monitor. Anesthesia intubated patient. Moved to [employer]. See Stroke Timeline for details.   1700 Patient was noted to be a Code Sepsis. Antibiotics retrieved from the ED and given to CRNA to be completed while in procedure. Care/Plan: Pt to be admitted to the ICU. Bedside handoff with IR RN [place].    Kathrin Greathouse  Stroke Response RN

## 2019-12-27 NOTE — Transfer of Care (Signed)
Immediate Anesthesia Transfer of Care Note  Patient: Judy English  Procedure(s) Performed: IR WITH ANESTHESIA (N/A )  Patient Location: ICU  Anesthesia Type:General  Level of Consciousness: sedated and Patient remains intubated per anesthesia plan  Airway & Oxygen Therapy: Patient remains intubated per anesthesia plan and Patient placed on Ventilator (see vital sign flow sheet for setting)  Post-op Assessment: Report given to RN and Post -op Vital signs reviewed and stable  Post vital signs: Reviewed and stable ABP 135/75; sp02 97%; HR 80  Last Vitals:  Vitals Value Taken Time  BP 149/83 01/04/2020 2123  Temp    Pulse 77 01/13/2020 2132  Resp 22 01/13/2020 2132  SpO2 100 % 01/01/2020 2132  Vitals shown include unvalidated device data.  Last Pain:  Vitals:   01/01/2020 1416  TempSrc:   PainSc: 0-No pain         Complications: No complications documented.

## 2019-12-27 NOTE — ED Notes (Signed)
200 Sol u Cortif given

## 2019-12-27 NOTE — ED Notes (Signed)
At CT

## 2019-12-27 NOTE — Progress Notes (Signed)
Pt intubated and sedated, under the care of anesthesia at this time, unable to assess NIH

## 2019-12-27 NOTE — ED Triage Notes (Signed)
Pt bib [facility] from home with AMS and stroke like symptoms. LKN >24 hrs, possibly around 36 hours. Pt with right sided weakness, droop, vision impairment and decreased sensation.  Pt in the 80's on RA. NRB 100%. ETCO2 21. Meets ems sepsis criteria, GI symptoms x 1week.  Recent covid exposure from husband. Pt reportedly been vaccinated. 12 lead unremarkable

## 2019-12-27 NOTE — Progress Notes (Signed)
eLink Physician-Brief Progress Note Patient Name: Artemisa Sladek DOB: 08-14-1960 MRN: [id number]   Date of Service  12/31/2019  HPI/Events of Note  59 y.o. female with PMH significant for DM2, HTN, HLD, Lupus on steroids who presents with 3 days of diarrhea, altered mental status with right-sided weakness and left gaze deviation.  Patient was initially reported to have a last known well 36 hours.  However, given her young age and her symptoms we were very concerned about an LVO and so we went ahead and activated a code stroke. S/P Lt common carotid arteriogram followed by rebvascularization of occluded Lt MCA M 1 seg with x 2 paasses with Tiger21 retriver and x 1 pass with SolitaireX 14mmx 40 mm retriever and aspiration achieving a TICI 3 revascularization nd rescue angioplasty of M1 seg and prox inferior division achieving a TICI 2C revascularization. COVID positive. PCCM asked to see in consultation for ventilator management.   eICU Interventions  Plan: 1. Propofol IV infusion. Titrate to RASS = 0 to -1.  2. L wrist restraint x 10 hours. 3. Fentanyl 25-100 mcg IV Q 2 hours PRN.     Intervention Category Evaluation Type: New Patient Evaluation  Lysle Dingwall 12/28/2019, 9:45 PM

## 2019-12-27 NOTE — Progress Notes (Signed)
Patient ID: Judy English, female   DOB: [birth year], 59 y.o.   MRN: [id number] INR. 17 Y RT H F MRSS1 LSW  10.39am  New onset RT sided weakness and aphasia and lt gaze deviation. CT Brain No ICH ASPECTS 10. CTA occluded RT MCA M 1 seg CTP core of 16ml and penumbra of 79 ml. Endovascular treatment D/w son . Reasons,procedure and alternatives reviewed. Risks of ICH of 10 to 15 %,worsening neuro function,death ,inability to revascularize reviewed.  Son expressed understanding and  provided consent for the treatment. S>Juvencio Verdi MD

## 2019-12-27 NOTE — ED Notes (Signed)
Code stroke called

## 2019-12-27 NOTE — ED Provider Notes (Signed)
[facility] EMERG[facility]Y DEPARTMENT Provider Note   CSN: [id number] Arrival date & time: 12/30/2019  1336     History Chief Complaint  Patient presents with  . Altered Mental Status  . stroke symtoms    Judy English is a 59 y.o. female history includes morbid obesity, lupus, hypertension, MI, fibromyalgia, diabetes, neuropathy, asthma, anxiety/depression.  Patient arrives via EMS today for concern of altered mental status.  History obtained from EMS personnel.  Last known well around 36 hours ago, patient with right-sided weakness and right-sided neglect.  Patient also endorsing decreased sensations on the right side.  Patient with previous CVA but unknown what previous deficits were.  Patient appears confused and lethargic.  On EMS arrival SPO2 around 80% improved on 15 L nonrebreather.  Additionally patient with GI symptoms x1 week, nausea/vomiting/diarrhea.  Known Covid positive exposure from husband.  Patient vaccinated.  Negative Covid test earlier this week.  Level 5 caveat altered mental status  HPI     Past Medical History:  Diagnosis Date  . Anxiety   . Asthma   . Depression   . Diabetes mellitus   . Fibromyalgia   . Heart attack ([place])   . Hypertension   . Lupus ([place])   . Morbid obesity ([place])   . Neuropathy     Patient Active Problem List   Diagnosis Date Noted  . 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 DIABETES MELLITUS, TYPE II, WITHOUT COMPLICATIONS 48/54/6270  . MORBID OBESITY 04/28/2008  . DEPRESSION 04/28/2008  . HYPERTENSION, ESSENTIAL 04/28/2008  . ASTHMA, UNSPECIFIED, UNSPECIFIED STATUS 04/28/2008  . GERD 04/28/2008  . DEGENERATIVE JOINT DISEASE, BOTH KNEES, SEVERE 04/28/2008  . SPONDYLOSIS, CERVICAL, WITHOUT MYELOPATHY 04/28/2008  . MYOFASCIAL PAIN SYNDROME 04/28/2008  . OBSTRUCTIVE SLEEP APNEA 05/02/2007    Past Surgical History:  Procedure Laterality Date  . ABDOMINAL EXPLORATION SURGERY    . CESAREAN SECTION    .  UTERINE FIBROID SURGERY       OB History   No obstetric history on file.     History reviewed. No pertinent family history.  Social History   Tobacco Use  . Smoking status: Never Smoker  . Smokeless tobacco: Never Used  Substance Use Topics  . Alcohol use: No  . Drug use: No    Home Medications Prior to Admission medications   Medication Sig Start Date End Date Taking? Authorizing Provider  albuterol (PROVENTIL HFA;VENTOLIN HFA) 108 (90 BASE) MCG/ACT inhaler Inhale 2 puffs into the lungs every 6 (six) hours as needed. For shortness of breath and wheezing     [provider]  amLODipine (NORVASC) 5 MG tablet Take 5 mg by mouth daily. 11/29/18   [provider]  aspirin 81 MG chewable tablet Chew 81 mg by mouth daily. 02/18/19   [provider]  atenolol (TENORMIN) 50 MG tablet Take 50 mg by mouth daily. 02/18/19   [provider]  BD ULTRA-FINE PEN NEEDLES 29G X 12.7MM MISC USE TO INJECT INSULIN UP TO TID 01/24/19   [provider]  buPROPion (WELLBUTRIN XL) 150 MG 24 hr tablet Take 150 mg by mouth 3 (three) times daily as needed. 11/29/18   [provider]  BYDUREON 2 MG PEN Inject 2 mg as directed once a week. 11/29/18   [provider]  celecoxib (CELEBREX) 100 MG capsule  02/18/19   [provider]  chlorthalidone (HYGROTON) 25 MG tablet Take 50 mg by mouth daily. 11/29/18   [provider]  cyclobenzaprine (FLEXERIL) 10 MG tablet Take 1 tablet (10 mg total) by mouth 2 (two) times daily as needed for muscle spasms. 08/30/16   Drenda Freeze, MD  FLUoxetine (PROZAC) 40 MG capsule Take 40 mg by mouth daily.    [provider]  fluticasone (FLONASE) 50 MCG/ACT nasal spray Place 2 sprays into both nostrils daily. 04/02/18   Horton, Barbette Hair, MD  Fluticasone-Salmeterol (ADVAIR) 500-50 MCG/DOSE AEPB Inhale 1 puff into the lungs every 12 (twelve) hours.      [provider]  furosemide (LASIX)  40 MG tablet Take 40 mg by mouth daily. 02/21/19   [provider]  gabapentin (NEURONTIN) 600 MG tablet Take 1 tablet by mouth 3 (three) times daily. 11/29/18   [provider]  hydrOXYzine (ATARAX/VISTARIL) 25 MG tablet Take 25 mg by mouth 4 (four) times daily.    [provider]  insulin aspart (NOVOLOG) 100 UNIT/ML injection Inject 10 Units into the skin 4 (four) times daily as needed for high blood sugar (sliding scale). 09/02/15   Fredia Sorrow, MD  Insulin Glargine (TOUJEO SOLOSTAR) 300 UNIT/ML SOPN Inject 90 Units into the skin 2 (two) times daily. 01/29/16   Recardo Evangelist, PA-C  JANUVIA 100 MG tablet Take 100 mg by mouth daily. 11/29/18   [provider]  LORazepam (ATIVAN) 0.5 MG tablet Take 0.5 mg by mouth 2 (two) times daily as needed for anxiety. 05/02/18   [provider]  omeprazole (PRILOSEC) 40 MG capsule Take 40 mg by mouth daily.    [provider]  ondansetron (ZOFRAN) 4 MG tablet Take 1 tablet (4 mg total) by mouth every 6 (six) hours. 07/07/18   Law, Bea Graff, PA-C  oxyCODONE-acetaminophen (PERCOCET) 10-325 MG tablet Take 1 tablet by mouth every 6 (six) hours as needed for pain. 06/28/18   [provider]  sertraline (ZOLOFT) 50 MG tablet Take 1 tablet (50 mg total) by mouth daily. 11/06/19   Merian Capron, MD  sodium chloride (OCEAN) 0.65 % SOLN nasal spray Place 1 spray into both nostrils as needed for congestion. 04/02/18   Horton, Barbette Hair, MD  triamcinolone lotion (KENALOG) 0.1 % APP EXT AA TID FOR 7 DAYS 02/18/19   [provider]    Allergies    Other, Acetaminophen, Ibuprofen, Latex, and Omnipaque [iohexol]  Review of Systems   Review of Systems  Unable to perform ROS: Mental status change    Physical Exam Updated Vital Signs BP (!) 150/93 (BP Location: Left Arm)   Pulse 100   Temp 98.8 F (37.1 C) (Rectal)   Resp (!) 32   Ht 5\' 6"  (1.676 m)   Wt 123.4 kg   LMP 09/08/2016   SpO2 98%    BMI 43.91 kg/m   Physical Exam Constitutional:      General: She is not in acute distress.    Appearance: Normal appearance. She is well-developed. She is obese. She is ill-appearing. She is not toxic-appearing or diaphoretic.  HENT:     Head: Normocephalic and atraumatic.  Eyes:     General: Gaze aligned appropriately.  Neck:     Trachea: Trachea and phonation normal.  Pulmonary:     Effort: Pulmonary effort is normal. No respiratory distress.  Abdominal:     General: There is no distension.     Palpations: Abdomen is soft.     Tenderness: There is no abdominal tenderness. There is no guarding or rebound.  Musculoskeletal:  General: Normal range of motion.     Cervical back: Normal range of motion.  Skin:    General: Skin is warm and dry.  Neurological:     Mental Status: She is alert.     GCS: GCS eye subscore is 4. GCS verbal subscore is 5. GCS motor subscore is 6.     Comments: Alert to self only, unable to answer any other question.  Speaks name clearly but quietly.  Right-sided facial droop.  Right-sided neglect. Left hand grip weak, right handgrip absent.     ED Results / Procedures / Treatments   Labs (all labs ordered are listed, but only abnormal results are displayed) Labs Reviewed  APTT - Abnormal; Notable for the following components:      Result Value   aPTT 22 (*)    All other components within normal limits  CBC - Abnormal; Notable for the following components:   Platelets 138 (*)    All other components within normal limits  DIFFERENTIAL - Abnormal; Notable for the following components:   Lymphs Abs 0.6 (*)    All other components within normal limits  COMPREHENSIVE METABOLIC PANEL - Abnormal; Notable for the following components:   Sodium 133 (*)    CO2 17 (*)    Glucose, Bld 354 (*)    BUN 41 (*)    Creatinine, Ser 1.98 (*)    Calcium 8.3 (*)    Albumin 3.0 (*)    AST 63 (*)    GFR calc non Af Amer 27 (*)    GFR calc Af Amer 32 (*)     All other components within normal limits  LACTIC ACID, PLASMA - Abnormal; Notable for the following components:   Lactic Acid, Venous 2.0 (*)    All other components within normal limits  I-STAT CHEM 8, ED - Abnormal; Notable for the following components:   BUN 42 (*)    Creatinine, Ser 1.90 (*)    Glucose, Bld 330 (*)    Calcium, Ion 0.98 (*)    TCO2 18 (*)    All other components within normal limits  CBG MONITORING, ED - Abnormal; Notable for the following components:   Glucose-Capillary 300 (*)    All other components within normal limits  TROPONIN I (HIGH SENSITIVITY) - Abnormal; Notable for the following components:   Troponin I (High Sensitivity) 24 (*)    All other components within normal limits  SARS CORONAVIRUS 2 BY RT PCR (HOSPITAL ORDER, [facility] [facility])  CULTURE, BLOOD (SINGLE)  URINE CULTURE  CULTURE, BLOOD (ROUTINE X 2)  CULTURE, BLOOD (ROUTINE X 2)  ETHANOL  PROTIME-INR  RAPID URINE DRUG SCREEN, HOSP PERFORMED  URINALYSIS, ROUTINE W REFLEX MICROSCOPIC  LACTIC ACID, PLASMA  HIV ANTIBODY (ROUTINE TESTING W REFLEX)  I-STAT BETA HCG BLOOD, ED ([facility], [facility], [facility] ONLY)  TROPONIN I (HIGH SENSITIVITY)    EKG EKG Interpretation  Date/Time:  Friday December 27 2019 14:02:04 EDT Ventricular Rate:  96 PR Interval:    QRS Duration: 93 QT Interval:  378 QTC Calculation: 476 R Axis:   -34 Text Interpretation: Sinus rhythm Abnormal R-wave progression, early transition Probable left ventricular hypertrophy Poor data quality in current ECG precludes serial comparison Confirmed by Sherwood Gambler [id number]) on 01/20/2020 2:18:08 PM   Radiology CT CEREBRAL PERFUSION W CONTRAST  Result Date: 12/25/2019 CLINICAL DATA:  Neuro deficit, acute, stroke suspected. EXAM: CT ANGIOGRAPHY HEAD AND NECK CT PERFUSION BRAIN TECHNIQUE: Multidetector CT imaging of the  head and neck was performed using the standard protocol during bolus administration of intravenous contrast.  Multiplanar CT image reconstructions and MIPs were obtained to evaluate the vascular anatomy. Carotid stenosis measurements (when applicable) are obtained utilizing NASCET criteria, using the distal internal carotid diameter as the denominator. Multiphase CT imaging of the brain was performed following IV bolus contrast injection. Subsequent parametric perfusion maps were calculated using RAPID software. CONTRAST:  160mL OMNIPAQUE IOHEXOL 350 MG/ML SOLN COMPARISON:  Same day head CT. FINDINGS: CTA NECK FINDINGS Aortic arch: Imaged portion shows no evidence of aneurysm or dissection. No significant stenosis of the major arch vessel origins. Right carotid system: No evidence of dissection, stenosis (50% or greater) or occlusion. Retropharyngeal course. Left carotid system: No evidence of dissection, stenosis (50% or greater) or occlusion. Vertebral arteries: Codominant. No evidence of dissection, stenosis (50% or greater) or occlusion. Skeleton: No acute osseous abnormality. Degenerative changes of the cervical spine with anterior osteophytes. Other neck: Borderline enlarged lymph nodes, nonspecific but likely reactive. Upper chest: Partially imaged extensive peripheral predominant ground-glass opacification with consolidation Review of the MIP images confirms the above findings CTA HEAD FINDINGS Anterior circulation: Occlusion of the left M1 MCA. Opacification of M2 branches likely from collateral flow. No aneurysm. Posterior circulation: There is moderate to advanced stenosis of the right P1 PCA and the right P2 PCA. Additionally, there is moderate to advanced stenosis of the left P2 PCA. No aneurysm. Venous sinuses: As permitted by contrast timing, patent. Review of the MIP images confirms the above findings CT Brain Perfusion Findings: CBF (<30%) Volume: 58mL (infarct core). Located within the posterior aspect of the left MCA territory. Perfusion (Tmax>6.0s) volume: 117mL Mismatch Volume: 56mL (penumbra)  IMPRESSION: 1. Occlusion of the left M1 MCA with a large area of left MCA territory penumbra (79 mL) and smaller core infarct. 2. Multifocal moderate to advanced stenosis of bilateral posterior cerebral arteries, as detailed above. 3. No significant (greater than 50%) stenosis in the neck. 4. Partially imaged extensive peripheral predominant ground-glass opacification with consolidation, compatible with multifocal pneumonia (likely COVID pneumonia). Code stroke imaging results were communicated on 01/02/2020 at 3:51 pm to provider Dr. Milas Gain via telephone, who verbally acknowledged these results. Electronically Signed   By: Margaretha Sheffield MD   On: 01/10/2020 16:07   DG Chest Port 1 View  Result Date: 01/06/2020 CLINICAL DATA:  Sepsis? EXAM: PORTABLE CHEST 1 VIEW COMPARISON:  Chest x-ray 05/20/2019, CT chest 12/06/2013 FINDINGS: Enlarged cardiomediastinal silhouette with some attribution to AP portable technique as well as low lung volumes. Bilateral patchy airspace opacities that are more prominent peripherally and within the lower lung zones. No pulmonary edema. No pleural effusion. No pneumothorax. No acute osseous finding. IMPRESSION: Pulmonary findings suspicious for COVID-19. Enlarged cardiac silhouette likely due to technique and low lung volumes. Electronically Signed   By: Iven Finn M.D.   On: 12/25/2019 14:41   CT HEAD CODE STROKE WO CONTRAST  Result Date: 01/08/2020 CLINICAL DATA:  Code stroke. EXAM: CT HEAD WITHOUT CONTRAST TECHNIQUE: Contiguous axial images were obtained from the base of the skull through the vertex without intravenous contrast. COMPARISON:  MRI July 10, 2008 FINDINGS: Brain: No evidence of acute large vascular territory infarct. Small remote right cerebellar lacunar infarct. Apparent hypodensity in the left cerebellum is favored to relate to streak artifact. No convincing evidence of acute large vascular territory infarct. Patchy white matter hypodensity is nonspecific but  likely relates to chronic microvascular ischemic disease. No acute hemorrhage. No hydrocephalus. No  mass lesion or abnormal mass effect. Vascular: No hyperdense vessel identified. Intracranial atherosclerosis. Skull: Normal. Negative for fracture or focal lesion. Sinuses/Orbits: Chronic changes in the inferior right maxillary sinus with mineralization. Otherwise, the sinuses are clear. Negative orbits. ASPECTS ([place] Stroke Program Early CT Score): 10 IMPRESSION: 1. No evidence of acute intracranial abnormality. 2. Presumed chronic microvascular ischemic disease and small remote right cerebellar lacunar infarct. 3. ASPECTS is 10 Code stroke imaging results were communicated on 01/05/2020 at 2:56 pm to provider Dr. Milas Gain Via telephone, who verbally acknowledged these results. Electronically Signed   By: Margaretha Sheffield MD   On: 01/08/2020 15:02   CT ANGIO HEAD CODE STROKE  Result Date: 01/08/2020 CLINICAL DATA:  Neuro deficit, acute, stroke suspected. EXAM: CT ANGIOGRAPHY HEAD AND NECK CT PERFUSION BRAIN TECHNIQUE: Multidetector CT imaging of the head and neck was performed using the standard protocol during bolus administration of intravenous contrast. Multiplanar CT image reconstructions and MIPs were obtained to evaluate the vascular anatomy. Carotid stenosis measurements (when applicable) are obtained utilizing NASCET criteria, using the distal internal carotid diameter as the denominator. Multiphase CT imaging of the brain was performed following IV bolus contrast injection. Subsequent parametric perfusion maps were calculated using RAPID software. CONTRAST:  171mL OMNIPAQUE IOHEXOL 350 MG/ML SOLN COMPARISON:  Same day head CT. FINDINGS: CTA NECK FINDINGS Aortic arch: Imaged portion shows no evidence of aneurysm or dissection. No significant stenosis of the major arch vessel origins. Right carotid system: No evidence of dissection, stenosis (50% or greater) or occlusion. Retropharyngeal course. Left carotid  system: No evidence of dissection, stenosis (50% or greater) or occlusion. Vertebral arteries: Codominant. No evidence of dissection, stenosis (50% or greater) or occlusion. Skeleton: No acute osseous abnormality. Degenerative changes of the cervical spine with anterior osteophytes. Other neck: Borderline enlarged lymph nodes, nonspecific but likely reactive. Upper chest: Partially imaged extensive peripheral predominant ground-glass opacification with consolidation Review of the MIP images confirms the above findings CTA HEAD FINDINGS Anterior circulation: Occlusion of the left M1 MCA. Opacification of M2 branches likely from collateral flow. No aneurysm. Posterior circulation: There is moderate to advanced stenosis of the right P1 PCA and the right P2 PCA. Additionally, there is moderate to advanced stenosis of the left P2 PCA. No aneurysm. Venous sinuses: As permitted by contrast timing, patent. Review of the MIP images confirms the above findings CT Brain Perfusion Findings: CBF (<30%) Volume: 74mL (infarct core). Located within the posterior aspect of the left MCA territory. Perfusion (Tmax>6.0s) volume: 168mL Mismatch Volume: 37mL (penumbra) IMPRESSION: 1. Occlusion of the left M1 MCA with a large area of left MCA territory penumbra (79 mL) and smaller core infarct. 2. Multifocal moderate to advanced stenosis of bilateral posterior cerebral arteries, as detailed above. 3. No significant (greater than 50%) stenosis in the neck. 4. Partially imaged extensive peripheral predominant ground-glass opacification with consolidation, compatible with multifocal pneumonia (likely COVID pneumonia). Code stroke imaging results were communicated on 01/12/2020 at 3:51 pm to provider Dr. Milas Gain via telephone, who verbally acknowledged these results. Electronically Signed   By: Margaretha Sheffield MD   On: 01/23/2020 16:07   CT ANGIO NECK CODE STROKE  Result Date: 12/25/2019 CLINICAL DATA:  Neuro deficit, acute, stroke suspected.  EXAM: CT ANGIOGRAPHY HEAD AND NECK CT PERFUSION BRAIN TECHNIQUE: Multidetector CT imaging of the head and neck was performed using the standard protocol during bolus administration of intravenous contrast. Multiplanar CT image reconstructions and MIPs were obtained to evaluate the vascular anatomy. Carotid stenosis measurements (when  applicable) are obtained utilizing NASCET criteria, using the distal internal carotid diameter as the denominator. Multiphase CT imaging of the brain was performed following IV bolus contrast injection. Subsequent parametric perfusion maps were calculated using RAPID software. CONTRAST:  195mL OMNIPAQUE IOHEXOL 350 MG/ML SOLN COMPARISON:  Same day head CT. FINDINGS: CTA NECK FINDINGS Aortic arch: Imaged portion shows no evidence of aneurysm or dissection. No significant stenosis of the major arch vessel origins. Right carotid system: No evidence of dissection, stenosis (50% or greater) or occlusion. Retropharyngeal course. Left carotid system: No evidence of dissection, stenosis (50% or greater) or occlusion. Vertebral arteries: Codominant. No evidence of dissection, stenosis (50% or greater) or occlusion. Skeleton: No acute osseous abnormality. Degenerative changes of the cervical spine with anterior osteophytes. Other neck: Borderline enlarged lymph nodes, nonspecific but likely reactive. Upper chest: Partially imaged extensive peripheral predominant ground-glass opacification with consolidation Review of the MIP images confirms the above findings CTA HEAD FINDINGS Anterior circulation: Occlusion of the left M1 MCA. Opacification of M2 branches likely from collateral flow. No aneurysm. Posterior circulation: There is moderate to advanced stenosis of the right P1 PCA and the right P2 PCA. Additionally, there is moderate to advanced stenosis of the left P2 PCA. No aneurysm. Venous sinuses: As permitted by contrast timing, patent. Review of the MIP images confirms the above findings CT  Brain Perfusion Findings: CBF (<30%) Volume: 77mL (infarct core). Located within the posterior aspect of the left MCA territory. Perfusion (Tmax>6.0s) volume: 163mL Mismatch Volume: 36mL (penumbra) IMPRESSION: 1. Occlusion of the left M1 MCA with a large area of left MCA territory penumbra (79 mL) and smaller core infarct. 2. Multifocal moderate to advanced stenosis of bilateral posterior cerebral arteries, as detailed above. 3. No significant (greater than 50%) stenosis in the neck. 4. Partially imaged extensive peripheral predominant ground-glass opacification with consolidation, compatible with multifocal pneumonia (likely COVID pneumonia). Code stroke imaging results were communicated on 01/07/2020 at 3:51 pm to provider Dr. Milas Gain via telephone, who verbally acknowledged these results. Electronically Signed   By: Margaretha Sheffield MD   On: 01/03/2020 16:07    Procedures .Critical Care Performed by: Deliah Boston, PA-C Authorized by: Deliah Boston, PA-C   Critical care provider statement:    Critical care time (minutes):  42   Critical care was necessary to treat or prevent imminent or life-threatening deterioration of the following conditions:  CNS failure or compromise and respiratory failure   Critical care was time spent personally by me on the following activities:  Discussions with consultants, evaluation of patient's response to treatment, examination of patient, ordering and performing treatments and interventions, ordering and review of laboratory studies, ordering and review of radiographic studies, pulse oximetry, re-evaluation of patient's condition, obtaining history from patient or surrogate, review of old charts and development of treatment plan with patient or surrogate   (including critical care time)  Medications Ordered in ED Medications  lactated ringers infusion (has no administration in time range)  lactated ringers bolus 1,000 mL (has no administration in time range)   ceFEPIme (MAXIPIME) 2 g in sodium chloride 0.9 % 100 mL IVPB (has no administration in time range)  metroNIDAZOLE (FLAGYL) IVPB 500 mg (has no administration in time range)  sodium chloride 0.9 % bolus 1,000 mL (has no administration in time range)  vancomycin (VANCOREADY) IVPB 1500 mg/300 mL (has no administration in time range)  ceFEPIme (MAXIPIME) 2 g in sodium chloride 0.9 % 100 mL IVPB (has no administration in time range)  stroke: mapping our early stages of recovery book (has no administration in time range)  0.9 %  sodium chloride infusion (has no administration in time range)  senna-docusate (Senokot-S) tablet 1 tablet (has no administration in time range)  aspirin 81 MG chewable tablet (has no administration in time range)  verapamil (ISOPTIN) 2.5 MG/ML injection (has no administration in time range)  ticagrelor (BRILINTA) 90 MG tablet (has no administration in time range)  clopidogrel (PLAVIX) 300 MG tablet (has no administration in time range)  nitroGLYCERIN 100 mcg/mL intra-arterial injection (has no administration in time range)  tirofiban (AGGRASTAT) 5-0.9 MG/100ML-% injection (has no administration in time range)  eptifibatide (INTEGRILIN) 20 MG/10ML injection (has no administration in time range)  fentaNYL (SUBLIMAZE) 100 MCG/2ML injection (has no administration in time range)  hydrocortisone sodium succinate (SOLU-CORTEF) injection 200 mg (200 mg Intravenous Given 01/12/2020 1518)  diphenhydrAMINE (BENADRYL) capsule 50 mg ( Oral See Alternative 01/05/2020 1525)    Or  diphenhydrAMINE (BENADRYL) injection 50 mg (50 mg Intravenous Given 01/22/2020 1525)  iohexol (OMNIPAQUE) 350 MG/ML injection 100 mL (100 mLs Intravenous Contrast Given 01/15/2020 1537)    ED Course  I have reviewed the triage vital signs and the nursing notes.  Pertinent labs & imaging results that were available during my care of the patient were reviewed by me and considered in my medical decision making (see chart for  details).  Clinical Course as of Dec 26 1616  Fri Dec 27, 2019  1415 Judy English, [facility] English [phone])  [phone] [facility] Phone)   [BM]  1417 7:10 PM   [BM]  [phone] Judy English   [BM]  1429 10 am    [BM]    Clinical Course User Index [BM] Gari Crown   MDM Rules/Calculators/A&P                         Additional history obtained from: 1. Nursing notes from this visit. 2. Previous medical records. 3. Family. ------------------------------------------------ 59 year old female arrives via EMS with concern of new right-sided deficits last known well between 24 and 36 hours ago.  Additionally with GI symptoms x1 week, known positive Covid exposure.  Patient hypoxic on EMS arrival improved on nonrebreather.  Patient outside window for code stroke/LVO if symptoms greater than 36 hours ago, CT head and stroke labs ordered.  Additionally evolving sepsis order set used, empiric antibiotics for unknown source ordered.  Covid test ordered.  Discussed case with Dr. Regenia Skeeter who is seeing patient. - 2:15 PM: I was able contact patient's son Judy English, he advised that he spoke with the patient over the phone around 7:10 PM last night and had a normal conversation.  Given new information possible patient may be in in the window for LVO, Dr. Regenia Skeeter spoke with neurology Dr. Lorrin Goodell, who advised activation of code stroke.  2:27 PM: I contacted patient's older son Judy English, advised that he spoke with the patient around 10 AM today and had a normal conversation at that time. Advises that patient is NOT vaccinated against COVID-19. Discussed case with Dr. Koleen Nimrod who is aware of info above and concern for COVID. - I ordered, reviewed and interpreted labs which include: I-STAT Chem-8 shows elevation of creatinine glucose, normal potassium and hemoglobin. Pregnancy test negative. Lactic 2.0. CBG 300. High-sensitivity troponin slightly elevated at 24, will obtain delta troponin.  CMP shows  elevation of creatinine and BUN, hyperglycemia, slightly decreased CO2 at 17, normal gap.  Patient does not appear to be  in DKA.  No emergent electrolyte derangement. Ethanol within normal limits. CBC shows no anemia or leukocytosis.  CT Head:  IMPRESSION:  1. No evidence of acute intracranial abnormality.  2. Presumed chronic microvascular ischemic disease and small remote  right cerebellar lacunar infarct.  3. ASPECTS is 10   IMPRESSION:  1. No evidence of acute intracranial abnormality.  2. Presumed chronic microvascular ischemic disease and small remote  right cerebellar lacunar infarct.  3. ASPECTS is 10   CXR:  IMPRESSION:  Pulmonary findings suspicious for COVID-19.    Enlarged cardiac silhouette likely due to technique and low lung  volumes.  =========== CTA Head/Neck:  IMPRESSION:  1. Occlusion of the left M1 MCA with a large area of left MCA  territory penumbra (79 mL) and smaller core infarct.  2. Multifocal moderate to advanced stenosis of bilateral posterior  cerebral arteries, as detailed above.  3. No significant (greater than 50%) stenosis in the neck.  4. Partially imaged extensive peripheral predominant ground-glass  opacification with consolidation, compatible with multifocal  pneumonia (likely COVID pneumonia).   Patient reevaluated, airway remains intact. Covid test is pending.  Suspect patient's respiratory symptoms and hypoxia are secondary to likely COVID-19 viral infection. Patient being taken to IR by neurology.   Judy English was evaluated in Emergency Department on 01/20/2020 for the symptoms described in the history of present illness. She was evaluated in the context of the global COVID-19 pandemic, which necessitated consideration that the patient might be at risk for infection with the SARS-CoV-2 virus that causes COVID-19. Institutional protocols and algorithms that pertain to the evaluation of patients at risk for COVID-19 are in a state  of rapid change based on information released by regulatory bodies including the CDC and federal and state organizations. These policies and algorithms were followed during the patient's care in the ED.   Note: Portions of this report may have been transcribed using voice recognition software. Every effort was made to ensure accuracy; however, inadvertent computerized transcription errors may still be present. Final Clinical Impression(s) / ED Diagnoses Final diagnoses:  Occlusion of left middle cerebral artery  Respiratory failure with hypoxia, unspecified chronicity ([place])  Suspected COVID-19 virus infection    Rx / DC Orders ED Discharge Orders    None       Gari Crown 01/20/2020 1618    Sherwood Gambler, MD 12/28/2019 [phone]

## 2019-12-27 NOTE — Consult Note (Addendum)
NAME:  Judy English, MRN:  [id number], DOB:  1960/08/08, LOS: 0 ADMISSION DATE:  12/26/2019, CONSULTATION DATE:  01/12/2020 REFERRING MD:  Lorrin Goodell, CHIEF COMPLAINT:  Lethargy  Brief History   59yF with history of SLE who is intubated for airway protection in setting acute L MCA stroke now s/p revascularization acute hypoxic respiratory failure likely from covid-19 pneumonia  History of present illness   Judy English is a 59 y.o. female with PMH significant for DM2, HTN, HLD, Lupus on steroids who presents with 3 days of diarrhea, altered mental status with right-sided weakness and left gaze deviation.    History is largely obtained through chart review as the patient is intubated and sedated at time of my evaluation. Husband recently found to have covid-19. She had 3 days of diarrhea and had been lethargic for about a day and a half PTA. Seemed to be cognitively intact when talking with her son earlier on the day of admission. She was BIBEMS and noted to be hypoxic, weak on R side RUE>RLE. CTA revealed L MCA occlusion and she was taken for revascularization. We are consulted for vent management and likely covid-19 pneumonia.  Past Medical History  SLE DM2 OSA  Significant Hospital Events   01/03/2020:Lt common carotid arteriogram followed by rebvascularization of occluded Lt MCA M 1 seg with x 2 paasses with Tiger21 retriver and x 1 pass with SolitaireX 16mmx 40 mm retriever and aspiration achieving a TICI 3 revascularization nd rescue angioplasty of M1 seg and prox inferior division achieving a TICI 2C revascularization  Consults:  PCCM  Procedures:  As above  Significant Diagnostic Tests:  CTA H/N: occlusion L MCA M1  Micro Data:  None  Antimicrobials:  Vanc/cefepime/flagyl started today  Interim history/subjective:  N/A  Objective   Blood pressure (!) 150/93, pulse 100, temperature 98.8 F (37.1 C), temperature source Rectal, resp. rate (!) 32, height 5\' 9"   (1.753 m), weight 123.5 kg, last menstrual period 09/08/2016, SpO2 100 %.    Vent Mode: PRVC FiO2 (%):  [60 %] 60 % Set Rate:  [14 bmp] 14 bmp Vt Set:  [520 mL] 520 mL PEEP:  [8 cmH20] 8 cmH20 Plateau Pressure:  [23 cmH20] 23 cmH20   Intake/Output Summary (Last 24 hours) at 01/16/2020 2222 Last data filed at 12/28/2019 2130 Gross per 24 hour  Intake 1550 ml  Output --  Net 1550 ml   Filed Weights   01/10/2020 1416 01/11/2020 2200  Weight: 123.4 kg 123.5 kg    Examination: General: intubated, sedated HENT: NCAT, dry MM Lungs: mechanical breath sounds, normal work of breathing Cardiovascular: RRR, no murmur, no JVD  Abdomen: soft, nontender, normal bowel sounds Extremities: warm, well-perfused without cyanosis, edema Neuro: sedated, RASS -4 on my exam, not following commands  Resolved Hospital Problem list   n/a  Assessment & Plan:  # Acute hypoxic respiratory failure: Intubated in setting of acute encephalopathy related to CVA and likely covid-19 pneumonia.  - check tracheal aspirate culture - remdesivir  - baricitinib - per her son although she has dx of lupus and there is chart history of her having SLE on steroids, she is not on them chronically and per last rheum note suspicion is low for autoimmune process and she does not appear to be on immunosuppression  - decadron 6mg  x10d course - de-escalation of ABX at 48h if cultures are unrevealing  - lung protective ventilation - target rass 0 to -1 ideally with prn fentanyl, if requires frequent  boluses add fentanyl gtt. Wean propofol off as able.     Best practice:  Diet: NPO Pain/Anxiety/Delirium protocol (if indicated): yes VAP protocol (if indicated): yes DVT prophylaxis: holding sq heparin immediately post revascularization  GI prophylaxis: yes Glucose control: will need SSI and will need to start half-home dose lantus while NPO Mobility: bed-level Code Status: Full Family Communication: to be updated in  AM Disposition: MICU  Labs   CBC: Recent Labs  Lab 01/02/2020 1352 01/12/2020 1502  WBC 5.2  --   NEUTROABS 4.5  --   HGB 12.5 12.6  HCT 37.8 37.0  MCV 81.8  --   PLT 138*  --     Basic Metabolic Panel: Recent Labs  Lab 12/28/2019 1352 01/15/2020 1502  NA 133* 136  K 4.9 5.0  CL 104 109  CO2 17*  --   GLUCOSE 354* 330*  BUN 41* 42*  CREATININE 1.98* 1.90*  CALCIUM 8.3*  --    GFR: Estimated Creatinine Clearance: 46 mL/min (A) (by C-G formula based on SCr of 1.9 mg/dL (H)). Recent Labs  Lab 01/14/2020 1352 12/31/2019 1445  WBC 5.2  --   LATICACIDVEN  --  2.0*    Liver Function Tests: Recent Labs  Lab 01/20/2020 1352  AST 63*  ALT 20  ALKPHOS 54  BILITOT 0.7  PROT 7.3  ALBUMIN 3.0*   No results for input(s): LIPASE, AMYLASE in the last 168 hours. No results for input(s): AMMONIA in the last 168 hours.  ABG    Component Value Date/Time   TCO2 18 (L) 12/25/2019 1502     Coagulation Profile: Recent Labs  Lab 01/08/2020 1352  INR 1.2    Cardiac Enzymes: No results for input(s): CKTOTAL, CKMB, CKMBINDEX, TROPONINI in the last 168 hours.  HbA1C: No results found for: HGBA1C  CBG: Recent Labs  Lab 12/28/2019 1406 01/18/2020 2121  GLUCAP 300* 412*    Review of Systems:   unable to obtain, pt is intubated and sedated  Past Medical History  She,  has a past medical history of Anxiety, Asthma, Depression, Diabetes mellitus, Fibromyalgia, Heart attack ([place]), Hypertension, Lupus ([place]), Morbid obesity ([place]), and Neuropathy.   Surgical History    Past Surgical History:  Procedure Laterality Date  . ABDOMINAL EXPLORATION SURGERY    . CESAREAN SECTION    . UTERINE FIBROID SURGERY       Social History   reports that she has never smoked. She has never used smokeless tobacco. She reports that she does not drink alcohol and does not use drugs.   Family History   Her family history is not on file.   Allergies Allergies  Allergen Reactions  .  Acetaminophen Rash  . Ibuprofen Rash  . Latex Rash  . Omnipaque [Iohexol] Itching    Pt has itching with iv contrast, needs premeds prior to exam     Home Medications  Prior to Admission medications   Medication Sig Start Date End Date Taking? Authorizing Provider  albuterol (PROVENTIL HFA;VENTOLIN HFA) 108 (90 BASE) MCG/ACT inhaler Inhale 2 puffs into the lungs every 6 (six) hours as needed. For shortness of breath and wheezing     [provider]  amLODipine (NORVASC) 5 MG tablet Take 5 mg by mouth daily. 11/29/18   [provider]  aspirin 81 MG chewable tablet Chew 81 mg by mouth daily. 02/18/19   [provider]  atenolol (TENORMIN) 50 MG tablet Take 50 mg by mouth daily. 02/18/19   [provider]  BD ULTRA-FINE PEN NEEDLES 29G X 12.7MM MISC USE TO INJECT INSULIN UP TO TID 01/24/19   [provider]  buPROPion (WELLBUTRIN XL) 150 MG 24 hr tablet Take 150 mg by mouth 3 (three) times daily as needed. 11/29/18   [provider]  BYDUREON 2 MG PEN Inject 2 mg as directed once a week. 11/29/18   [provider]  celecoxib (CELEBREX) 100 MG capsule  02/18/19   [provider]  chlorthalidone (HYGROTON) 25 MG tablet Take 50 mg by mouth daily. 11/29/18   [provider]  cyclobenzaprine (FLEXERIL) 10 MG tablet Take 1 tablet (10 mg total) by mouth 2 (two) times daily as needed for muscle spasms. 08/30/16   Drenda Freeze, MD  FLUoxetine (PROZAC) 40 MG capsule Take 40 mg by mouth daily.    [provider]  fluticasone (FLONASE) 50 MCG/ACT nasal spray Place 2 sprays into both nostrils daily. 04/02/18   Horton, Barbette Hair, MD  Fluticasone-Salmeterol (ADVAIR) 500-50 MCG/DOSE AEPB Inhale 1 puff into the lungs every 12 (twelve) hours.      [provider]  furosemide (LASIX) 40 MG tablet Take 40 mg by mouth daily. 02/21/19   [provider]  gabapentin (NEURONTIN) 600 MG tablet Take 1 tablet by mouth 3  (three) times daily. 11/29/18   [provider]  hydrOXYzine (ATARAX/VISTARIL) 25 MG tablet Take 25 mg by mouth 4 (four) times daily.    [provider]  insulin aspart (NOVOLOG) 100 UNIT/ML injection Inject 10 Units into the skin 4 (four) times daily as needed for high blood sugar (sliding scale). 09/02/15   Fredia Sorrow, MD  Insulin Glargine (TOUJEO SOLOSTAR) 300 UNIT/ML SOPN Inject 90 Units into the skin 2 (two) times daily. 01/29/16   Recardo Evangelist, PA-C  JANUVIA 100 MG tablet Take 100 mg by mouth daily. 11/29/18   [provider]  LORazepam (ATIVAN) 0.5 MG tablet Take 0.5 mg by mouth 2 (two) times daily as needed for anxiety. 05/02/18   [provider]  omeprazole (PRILOSEC) 40 MG capsule Take 40 mg by mouth daily.    [provider]  ondansetron (ZOFRAN) 4 MG tablet Take 1 tablet (4 mg total) by mouth every 6 (six) hours. 07/07/18   Law, Bea Graff, PA-C  oxyCODONE-acetaminophen (PERCOCET) 10-325 MG tablet Take 1 tablet by mouth every 6 (six) hours as needed for pain. 06/28/18   [provider]  sertraline (ZOLOFT) 50 MG tablet Take 1 tablet (50 mg total) by mouth daily. 11/06/19   Merian Capron, MD  sodium chloride (OCEAN) 0.65 % SOLN nasal spray Place 1 spray into both nostrils as needed for congestion. 04/02/18   Horton, Barbette Hair, MD  triamcinolone lotion (KENALOG) 0.1 % APP EXT AA TID FOR 7 DAYS 02/18/19   [provider]     Critical care time: 20    This patient is critically ill with acute hypoxic respiratory failure and acute encephalopathy requiring mechanical ventilation; which, requires frequent high complexity decision making, assessment, support, evaluation, and titration of therapies. This was completed through the application of advanced monitoring technologies and extensive interpretation of multiple databases. During this encounter critical care time was devoted to patient care services described in this note for  20 minutes.  Letta Median, Pulmonary/Critical Care

## 2019-12-27 NOTE — ED Notes (Signed)
Out of CT

## 2019-12-27 NOTE — ED Provider Notes (Signed)
Angiocath insertion Performed by: Ephraim Hamburger  Consent: Emergent Consent Risks and benefits: risks, benefits and alternatives were discussed Time out: Immediately prior to procedure a "time out" was called to verify the correct patient, procedure, equipment, support staff and site/side marked as required.  Preparation: Patient was prepped and draped in the usual sterile fashion.  Vein Location: right basilic  Ultrasound Guided  Gauge: 18  Normal blood return and flush without difficulty Patient tolerance: Patient tolerated the procedure well with no immediate complications.  Patient brought in by EMS.  At first it is unclear how long she has been altered but through further history with family it seems like it was today.  She meets criteria for LVO and code stroke was called.  She also appears to have probable Covid pneumonia.  She is oxygenating okay but is altered and neglects the right side.  Found to have large vessel occlusion on CT angiography and will be taken to IR.  CRITICAL CARE Performed by: Ephraim Hamburger   Total critical care time: 40 minutes  Critical care time was exclusive of separately billable procedures and treating other patients.  Critical care was necessary to treat or prevent imminent or life-threatening deterioration.  Critical care was time spent personally by me on the following activities: development of treatment plan with patient and/or surrogate as well as nursing, discussions with consultants, evaluation of patient's response to treatment, examination of patient, obtaining history from patient or surrogate, ordering and performing treatments and interventions, ordering and review of laboratory studies, ordering and review of radiographic studies, pulse oximetry and re-evaluation of patient's condition.    Sherwood Gambler, MD 01/14/2020 [phone]

## 2019-12-27 NOTE — Anesthesia Preprocedure Evaluation (Addendum)
Anesthesia Evaluation  Patient identified by MRN, date of birth, ID bandGeneral Assessment Comment:Awake, intermittently alert  Reviewed: Patient's Chart, lab work & pertinent test results  History of Anesthesia Complications Negative for: history of anesthetic complications  Airway Mallampati: IV  TM Distance: >3 FB Neck ROM: Full    Dental  (+) Dental Advisory Given   Pulmonary sleep apnea ,     + decreased breath sounds      Cardiovascular hypertension, + Past MI   Rhythm:Regular     Neuro/Psych PSYCHIATRIC DISORDERS Anxiety Depression CVA, Residual Symptoms    GI/Hepatic   Endo/Other  diabetesMorbid obesity  Renal/GU      Musculoskeletal  (+) Arthritis , Fibromyalgia -  Abdominal   Peds  Hematology   Anesthesia Other Findings   Reproductive/Obstetrics                            Anesthesia Physical Anesthesia Plan  ASA: IV  Anesthesia Plan: General   Post-op Pain Management:    Induction: Intravenous, Rapid sequence and Cricoid pressure planned  PONV Risk Score and Plan: 3 and Treatment may vary due to age or medical condition  Airway Management Planned: Oral ETT  Additional Equipment: Arterial line  Intra-op Plan:   Post-operative Plan: Post-operative intubation/ventilation  Informed Consent:     Only emergency history available  Plan Discussed with: Surgeon and CRNA  Anesthesia Plan Comments:         Anesthesia Quick Evaluation

## 2019-12-27 NOTE — Consult Note (Addendum)
NEUROLOGY CONSULTATION NOTE   Date of service: December 27, 2019 Patient Name: Judy English MRN:  [id number] DOB:  02-10-1961 Reason for consult: "Stroke code"  History of Present Illness  Rether Rison is a 59 y.o. female with PMH significant for DM2, HTN, HLD, Lupus on steroids who presents with 3 days of diarrhea, altered mental status with right-sided weakness and left gaze deviation.  Patient was initially reported to have a last known well 36 hours.  However, given her young age and her symptoms we were very concerned about an LVO and so we went ahead and activated a code stroke.  On discussion with her son Legrand Como Moore([phone]) over the phone, he had seen her yesterday and she was lethargic but she seemed to be doing fine.  He also spoke to her today at 10:39 AM and she seemed to be completely with it and reported that she was going to go in and take her steroids for her lupus.  She was later found down by EMS.  She also met sepsis criteria on presentation with saturations of 80% on room air and her husband tested positive for Covid however per Legrand Como, patient had a Covid testing that was done by her PCP which was negative.  Work-up with CT had stroke code with an aspect ratio of 10.  Given concern for her Covid status and noted allergies to iodine contrast, vessel imaging with CT angio ended up being delayed until she was premedicated.  CT angio with left MCA M1 occlusion and CTP with a left MCA territory mismatch and a large penumbra of 79 mL and a smaller core.  By the time CT head, CTA and CT perfusion were back, patient's son Legrand Como was in the ED.  We had Dr. Estanislado Pandy on the phone and me personally present in in a private room in the ED and we discussed the risks and benefits of attempting thrombectomy.  Patient's son Legrand Como expressed understanding of the procedure and consented on patient's behalf.  NIHSS: 22 LKW: 1039 on 01/04/2020 Premorbid mRS: 1 TPA: outside  the presentation Thrombectomy: Yes.   ROS   Unable to obtain given the acuity of situation and her aphasia.  Past History   Past Medical History:  Diagnosis Date  . Anxiety   . Asthma   . Depression   . Diabetes mellitus   . Fibromyalgia   . Heart attack ([place])   . Hypertension   . Lupus ([place])   . Morbid obesity ([place])   . Neuropathy    Past Surgical History:  Procedure Laterality Date  . ABDOMINAL EXPLORATION SURGERY    . CESAREAN SECTION    . UTERINE FIBROID SURGERY     History reviewed. No pertinent family history. Social History   Socioeconomic History  . Marital status: Married    Spouse name: Not on file  . Number of children: Not on file  . Years of education: Not on file  . Highest education level: Not on file  Occupational History  . Not on file  Tobacco Use  . Smoking status: Never Smoker  . Smokeless tobacco: Never Used  Substance and Sexual Activity  . Alcohol use: No  . Drug use: No  . Sexual activity: Not on file  Other Topics Concern  . Not on file  Social History Narrative  . Not on file   Social Determinants of Health   Financial Resource Strain:   . Difficulty of Paying Living Expenses: Not on file  Food Insecurity:   . Worried About Charity fundraiser in the Last Year: Not on file  . Ran Out of Food in the Last Year: Not on file  Transportation Needs:   . Lack of Transportation (Medical): Not on file  . Lack of Transportation (Non-Medical): Not on file  Physical Activity:   . Days of Exercise per Week: Not on file  . Minutes of Exercise per Session: Not on file  Stress:   . Feeling of Stress : Not on file  Social Connections:   . Frequency of Communication with Friends and Family: Not on file  . Frequency of Social Gatherings with Friends and Family: Not on file  . Attends Religious Services: Not on file  . Active Member of Clubs or Organizations: Not on file  . Attends Archivist Meetings: Not on file  . Marital  Status: Not on file   Allergies  Allergen Reactions  . Other   . Acetaminophen Rash  . Ibuprofen Rash  . Latex Rash  . Omnipaque [Iohexol] Itching    Pt has itching with iv contrast, needs premeds prior to exam    Medications  (Not in a hospital admission)    Vitals  Temp:  [98.8 F (37.1 C)-99.5 F (37.5 C)] 98.8 F (37.1 C) (09/03 1403) Pulse Rate:  [94] 94 (09/03 1340) Resp:  [20] 20 (09/03 1340) BP: (163)/(90) 163/90 (09/03 1340) SpO2:  [86 %-96 %] 96 % (09/03 1347) Weight:  [123.4 kg] 123.4 kg (09/03 1416)  Body mass index is 43.91 kg/m.  Physical Exam   General: Laying comfortably in bed; in no acute distress.  HENT: Normal oropharynx and mucosa. Normal external appearance of ears and nose. Neck: Supple, no pain or tenderness CV: No JVD. No peripheral edema. Pulmonary: Symmetric Chest rise. Normal respiratory effort. Abdomen: Soft to touch, non-tender Ext: No cyanosis, edema, or deformity  Skin: No rash. Normal palpation of skin.   Musculoskeletal: Normal digits and nails by inspection. No clubbing.  Neurologic Examination  Mental status/Cognition: Alert, oriented to self only. Speech/language: Non fluent, able to follow commands on the left.  Cranial nerves:   CN II Pupils equal and reactive to light, no VF deficits   CN III,IV,VI EOM intact, no gaze preference or deviation, no nystagmus   CN V normal sensation in V1, V2, and V3 segments bilaterally   CN VII R facial droop.   CN VIII    CN IX & X normal palatal elevation, no uvular deviation   CN XI    CN XII    Motor:  Muscle bulk: normal, tone flacced on the R Moves LUE and LLE purposefully and on command. No movement in RUE, some movement in RLE.  Sensation:  Light touch Decreased in BL lower extremities.   Pin prick    Temperature    Vibration   Proprioception     Labs   Lab Results  Component Value Date   NA 137 05/20/2019   K 4.0 05/20/2019   CL 104 05/20/2019   CO2 23 05/20/2019    GLUCOSE 360 (H) 05/20/2019   BUN 23 (H) 05/20/2019   CREATININE 1.56 (H) 05/20/2019   CALCIUM 8.7 (L) 05/20/2019   ALBUMIN 3.7 01/03/2019   AST 25 01/03/2019   ALT 21 01/03/2019   ALKPHOS 59 01/03/2019   BILITOT 0.4 01/03/2019   GFRNONAA 37 (L) 05/20/2019   GFRAA 43 (L) 05/20/2019     Imaging and Diagnostic studies   CTH  was negative for a large hypodensity concerning for a large territory infarct or hyperdensity concerning for an ICH. ASPECTS of 10. CTA: Left MCA M1 occlusion with moderate to advanced PCA stenosis CTP with 51mL core and 57ml of penumbra.  Impression   Annaleese Guier is a 59 y.o. female with PMH significant for DM2, HTN, HLD, Lupus on steroids who presents with 3 days of diarrhea, altered mental status with right-sided weakness and left gaze deviation. Found to have L MCA M1 occlusion with a 80ml of penumbra. Also meets sepsis criteria and suspicion for underlying Covid Pneumonia on CT Chest with known exposure and GI symptoms x 3 days.  Recommendations  - Brain imaging- MRI Brain pending - Vascular imaging- CTA Brain/Neck with L MCA M1, CTP with 43ml penumbra. - TTE - pendinf - Lipid panel - ordered  - A1C - ordered  - Antithrombotic - Aspirin $RemoveB'81mg'[id number]$  daily. - DVT ppx - sq Heprain - SBP goal - permissive hypertension first 24 h < 220/110. Held home meds.  - Telemetry monitoring for arrythmia- ordered - Swallow screen - ordered - Stroke education - ordered  - PT/OT/SLP consulted   Suspected COVID Pneumonia and Sepsis: - will get PCCM team to assist with management  HTN:  Hold AntiHTN meds.  DM2: - Sliding scale insulin with carb correction.  GERD: - Continue PPI  ______________________________________________________________________   Thank you for the opportunity to take part in the care of this patient. If you have any further questions, please contact the neurology consultation attending.  Signed,  [place] Pager Number [phone]

## 2019-12-28 ENCOUNTER — Inpatient Hospital Stay (HOSPITAL_COMMUNITY): Payer: Medicaid Other

## 2019-12-28 ENCOUNTER — Other Ambulatory Visit: Payer: Self-pay

## 2019-12-28 DIAGNOSIS — I63312 Cerebral infarction due to thrombosis of left middle cerebral artery: Secondary | ICD-10-CM

## 2019-12-28 DIAGNOSIS — E1165 Type 2 diabetes mellitus with hyperglycemia: Secondary | ICD-10-CM

## 2019-12-28 DIAGNOSIS — U071 COVID-19: Secondary | ICD-10-CM

## 2019-12-28 DIAGNOSIS — I6389 Other cerebral infarction: Secondary | ICD-10-CM

## 2019-12-28 DIAGNOSIS — I6602 Occlusion and stenosis of left middle cerebral artery: Secondary | ICD-10-CM

## 2019-12-28 DIAGNOSIS — E782 Mixed hyperlipidemia: Secondary | ICD-10-CM

## 2019-12-28 DIAGNOSIS — Z4659 Encounter for fitting and adjustment of other gastrointestinal appliance and device: Secondary | ICD-10-CM

## 2019-12-28 DIAGNOSIS — J9601 Acute respiratory failure with hypoxia: Secondary | ICD-10-CM

## 2019-12-28 LAB — RAPID URINE DRUG SCREEN, HOSP PERFORMED
Amphetamines: NOT DETECTED
Barbiturates: NOT DETECTED
Benzodiazepines: NOT DETECTED
Cocaine: NOT DETECTED
Opiates: NOT DETECTED
Tetrahydrocannabinol: NOT DETECTED

## 2019-12-28 LAB — BASIC METABOLIC PANEL
Anion gap: 13 (ref 5–15)
BUN: 44 mg/dL — ABNORMAL HIGH (ref 6–20)
CO2: 14 mmol/L — ABNORMAL LOW (ref 22–32)
Calcium: 7.6 mg/dL — ABNORMAL LOW (ref 8.9–10.3)
Chloride: 108 mmol/L (ref 98–111)
Creatinine, Ser: 2.51 mg/dL — ABNORMAL HIGH (ref 0.44–1.00)
GFR calc Af Amer: 24 mL/min — ABNORMAL LOW (ref >60)
GFR calc non Af Amer: 21 mL/min — ABNORMAL LOW (ref >60)
Glucose, Bld: 378 mg/dL — ABNORMAL HIGH (ref 70–99)
Potassium: 4.7 mmol/L (ref 3.5–5.1)
Sodium: 135 mmol/L (ref 135–145)

## 2019-12-28 LAB — GLUCOSE, CAPILLARY
Glucose-Capillary: 459 mg/dL — ABNORMAL HIGH (ref 70–99)
Glucose-Capillary: 372 mg/dL — ABNORMAL HIGH (ref 70–99)
Glucose-Capillary: 328 mg/dL — ABNORMAL HIGH (ref 70–99)
Glucose-Capillary: 276 mg/dL — ABNORMAL HIGH (ref 70–99)
Glucose-Capillary: 275 mg/dL — ABNORMAL HIGH (ref 70–99)
Glucose-Capillary: 243 mg/dL — ABNORMAL HIGH (ref 70–99)
Glucose-Capillary: 161 mg/dL — ABNORMAL HIGH (ref 70–99)
Glucose-Capillary: 212 mg/dL — ABNORMAL HIGH (ref 70–99)
Glucose-Capillary: 125 mg/dL — ABNORMAL HIGH (ref 70–99)
Glucose-Capillary: 44 mg/dL — CL (ref 70–99)
Glucose-Capillary: 62 mg/dL — ABNORMAL LOW (ref 70–99)
Glucose-Capillary: 107 mg/dL — ABNORMAL HIGH (ref 70–99)
Glucose-Capillary: 119 mg/dL — ABNORMAL HIGH (ref 70–99)
Glucose-Capillary: 137 mg/dL — ABNORMAL HIGH (ref 70–99)

## 2019-12-28 LAB — POCT I-STAT 7, (LYTES, BLD GAS, ICA,H+H)
Acid-base deficit: 9 mmol/L — ABNORMAL HIGH (ref 0.0–2.0)
Acid-base deficit: 9 mmol/L — ABNORMAL HIGH (ref 0.0–2.0)
Acid-base deficit: 8 mmol/L — ABNORMAL HIGH (ref 0.0–2.0)
Acid-base deficit: 11 mmol/L — ABNORMAL HIGH (ref 0.0–2.0)
Bicarbonate: 16 mmol/L — ABNORMAL LOW (ref 20.0–28.0)
Bicarbonate: 16.2 mmol/L — ABNORMAL LOW (ref 20.0–28.0)
Bicarbonate: 16.4 mmol/L — ABNORMAL LOW (ref 20.0–28.0)
Bicarbonate: 15.2 mmol/L — ABNORMAL LOW (ref 20.0–28.0)
Calcium, Ion: 1.1 mmol/L — ABNORMAL LOW (ref 1.15–1.40)
Calcium, Ion: 1.14 mmol/L — ABNORMAL LOW (ref 1.15–1.40)
Calcium, Ion: 1.1 mmol/L — ABNORMAL LOW (ref 1.15–1.40)
Calcium, Ion: 1.09 mmol/L — ABNORMAL LOW (ref 1.15–1.40)
HCT: 29 % — ABNORMAL LOW (ref 36.0–46.0)
HCT: 31 % — ABNORMAL LOW (ref 36.0–46.0)
HCT: 29 % — ABNORMAL LOW (ref 36.0–46.0)
HCT: 29 % — ABNORMAL LOW (ref 36.0–46.0)
Hemoglobin: 10.5 g/dL — ABNORMAL LOW (ref 12.0–15.0)
Hemoglobin: 9.9 g/dL — ABNORMAL LOW (ref 12.0–15.0)
Hemoglobin: 9.9 g/dL — ABNORMAL LOW (ref 12.0–15.0)
Hemoglobin: 9.9 g/dL — ABNORMAL LOW (ref 12.0–15.0)
O2 Saturation: 88 %
O2 Saturation: 89 %
O2 Saturation: 95 %
O2 Saturation: 88 %
Patient temperature: 98.2
Patient temperature: 99.2
Potassium: 4 mmol/L (ref 3.5–5.1)
Potassium: 4.1 mmol/L (ref 3.5–5.1)
Potassium: 4.5 mmol/L (ref 3.5–5.1)
Potassium: 5 mmol/L (ref 3.5–5.1)
Sodium: 141 mmol/L (ref 135–145)
Sodium: 142 mmol/L (ref 135–145)
Sodium: 140 mmol/L (ref 135–145)
Sodium: 139 mmol/L (ref 135–145)
TCO2: 17 mmol/L — ABNORMAL LOW (ref 22–32)
TCO2: 17 mmol/L — ABNORMAL LOW (ref 22–32)
TCO2: 17 mmol/L — ABNORMAL LOW (ref 22–32)
TCO2: 16 mmol/L — ABNORMAL LOW (ref 22–32)
pCO2 arterial: 29.7 mmHg — ABNORMAL LOW (ref 32.0–48.0)
pCO2 arterial: 32 mmHg (ref 32.0–48.0)
pCO2 arterial: 30.5 mmHg — ABNORMAL LOW (ref 32.0–48.0)
pCO2 arterial: 35.1 mmHg (ref 32.0–48.0)
pH, Arterial: 7.313 — ABNORMAL LOW (ref 7.350–7.450)
pH, Arterial: 7.337 — ABNORMAL LOW (ref 7.350–7.450)
pH, Arterial: 7.339 — ABNORMAL LOW (ref 7.350–7.450)
pH, Arterial: 7.247 — ABNORMAL LOW (ref 7.350–7.450)
pO2, Arterial: 59 mmHg — ABNORMAL LOW (ref 83.0–108.0)
pO2, Arterial: 59 mmHg — ABNORMAL LOW (ref 83.0–108.0)
pO2, Arterial: 77 mmHg — ABNORMAL LOW (ref 83.0–108.0)
pO2, Arterial: 65 mmHg — ABNORMAL LOW (ref 83.0–108.0)

## 2019-12-28 LAB — LIPID PANEL
Cholesterol: 191 mg/dL (ref 0–200)
HDL: 20 mg/dL — ABNORMAL LOW (ref >40)
LDL Cholesterol: UNDETERMINED mg/dL (ref 0–99)
Total CHOL/HDL Ratio: 9.6 RATIO
Triglycerides: 543 mg/dL — ABNORMAL HIGH (ref <150)
VLDL: UNDETERMINED mg/dL (ref 0–40)

## 2019-12-28 LAB — CBC WITH DIFFERENTIAL/PLATELET
Abs Immature Granulocytes: 0 10*3/uL (ref 0.00–0.07)
Basophils Absolute: 0 10*3/uL (ref 0.0–0.1)
Basophils Relative: 0 %
Eosinophils Absolute: 0 10*3/uL (ref 0.0–0.5)
Eosinophils Relative: 0 %
HCT: 32.5 % — ABNORMAL LOW (ref 36.0–46.0)
Hemoglobin: 10.6 g/dL — ABNORMAL LOW (ref 12.0–15.0)
Lymphocytes Relative: 9 %
Lymphs Abs: 0.4 10*3/uL — ABNORMAL LOW (ref 0.7–4.0)
MCH: 27.8 pg (ref 26.0–34.0)
MCHC: 32.6 g/dL (ref 30.0–36.0)
MCV: 85.3 fL (ref 80.0–100.0)
Monocytes Absolute: 0.1 10*3/uL (ref 0.1–1.0)
Monocytes Relative: 3 %
Neutro Abs: 3.4 10*3/uL (ref 1.7–7.7)
Neutrophils Relative %: 88 %
Platelets: 149 10*3/uL — ABNORMAL LOW (ref 150–400)
RBC: 3.81 MIL/uL — ABNORMAL LOW (ref 3.87–5.11)
RDW: 12.6 % (ref 11.5–15.5)
WBC: 3.9 10*3/uL — ABNORMAL LOW (ref 4.0–10.5)
nRBC: 0 % (ref 0.0–0.2)
nRBC: 0 /100 WBC

## 2019-12-28 LAB — SODIUM, URINE, RANDOM: Sodium, Ur: <10 mmol/L

## 2019-12-28 LAB — ECHOCARDIOGRAM LIMITED
Area-P 1/2: 2.95 cm2
Height: 69 in
S' Lateral: 2.6 cm
Weight: 4356.29 oz

## 2019-12-28 LAB — URINALYSIS, ROUTINE W REFLEX MICROSCOPIC
Bacteria, UA: NONE SEEN
Bilirubin Urine: NEGATIVE
Glucose, UA: 150 mg/dL — AB
Ketones, ur: NEGATIVE mg/dL
Leukocytes,Ua: NEGATIVE
Nitrite: NEGATIVE
Protein, ur: >=300 mg/dL — AB
Specific Gravity, Urine: 1.023 (ref 1.005–1.030)
pH: 5 (ref 5.0–8.0)

## 2019-12-28 LAB — FIBRINOGEN: Fibrinogen: 418 mg/dL (ref 210–475)

## 2019-12-28 LAB — MRSA PCR SCREENING: MRSA by PCR: NEGATIVE

## 2019-12-28 LAB — FERRITIN: Ferritin: 690 ng/mL — ABNORMAL HIGH (ref 11–307)

## 2019-12-28 LAB — TRIGLYCERIDES: Triglycerides: 453 mg/dL — ABNORMAL HIGH (ref <150)

## 2019-12-28 LAB — PROCALCITONIN: Procalcitonin: <0.1 ng/mL

## 2019-12-28 LAB — LDL CHOLESTEROL, DIRECT: Direct LDL: 97.2 mg/dL (ref 0–99)

## 2019-12-28 LAB — CREATININE, URINE, RANDOM: Creatinine, Urine: 104.35 mg/dL

## 2019-12-28 LAB — HEMOGLOBIN A1C
Hgb A1c MFr Bld: 10.7 % — ABNORMAL HIGH (ref 4.8–5.6)
Mean Plasma Glucose: 260.39 mg/dL

## 2019-12-28 LAB — C-REACTIVE PROTEIN: CRP: 8.3 mg/dL — ABNORMAL HIGH (ref <1.0)

## 2019-12-28 LAB — D-DIMER, QUANTITATIVE: D-Dimer, Quant: 4.89 ug/mL-FEU — ABNORMAL HIGH (ref 0.00–0.50)

## 2019-12-28 MED ORDER — ASPIRIN 325 MG PO TABS
325.0000 mg | ORAL_TABLET | Freq: Every day | ORAL | Status: DC
  Administered 2019-12-28: 325 mg
  Filled 2019-12-28: qty 1

## 2019-12-28 MED ORDER — INSULIN DETEMIR 100 UNIT/ML ~~LOC~~ SOLN
8.0000 [IU] | Freq: Two times a day (BID) | SUBCUTANEOUS | Status: DC
  Administered 2019-12-28: 8 [IU] via SUBCUTANEOUS
  Filled 2019-12-28 (×2): qty 0.08

## 2019-12-28 MED ORDER — NOREPINEPHRINE 4 MG/250ML-% IV SOLN: 0.0000 ug/min | INTRAVENOUS | Status: DC

## 2019-12-28 MED ORDER — CHLORHEXIDINE GLUCONATE CLOTH 2 % EX PADS
6.0000 | MEDICATED_PAD | Freq: Every day | CUTANEOUS | Status: DC
  Administered 2019-12-28: 6 via TOPICAL

## 2019-12-28 MED ORDER — MIDAZOLAM 50MG/50ML (1MG/ML) PREMIX INFUSION
0.5000 mg/h | INTRAVENOUS | Status: DC
  Administered 2019-12-28: 0.5 mg/h via INTRAVENOUS
  Filled 2019-12-28: qty 50

## 2019-12-28 MED ORDER — VASOPRESSIN 20 UNITS/100 ML INFUSION FOR SHOCK
0.0000 [IU]/min | INTRAVENOUS | Status: DC
  Filled 2019-12-28: qty 100

## 2019-12-28 MED ORDER — NOREPINEPHRINE 4 MG/250ML-% IV SOLN
INTRAVENOUS | Status: AC
  Administered 2019-12-28: 6 ug/min via INTRAVENOUS
  Filled 2019-12-28: qty 250

## 2019-12-28 MED ORDER — FENTANYL BOLUS VIA INFUSION
50.0000 ug | INTRAVENOUS | Status: DC | PRN
  Administered 2019-12-28 (×2): 50 ug via INTRAVENOUS
  Filled 2019-12-28: qty 50

## 2019-12-28 MED ORDER — FENTANYL CITRATE (PF) 100 MCG/2ML IJ SOLN
50.0000 ug | Freq: Once | INTRAMUSCULAR | Status: DC
  Filled 2019-12-28: qty 2

## 2019-12-28 MED ORDER — INSULIN REGULAR(HUMAN) IN NACL 100-0.9 UT/100ML-% IV SOLN
INTRAVENOUS | Status: DC
  Administered 2019-12-28: 14 [IU]/h via INTRAVENOUS
  Filled 2019-12-28 (×2): qty 100

## 2019-12-28 MED ORDER — SODIUM CHLORIDE 0.9 % IV SOLN: INTRAVENOUS | Status: DC

## 2019-12-28 MED ORDER — ATORVASTATIN CALCIUM 40 MG PO TABS
40.0000 mg | ORAL_TABLET | Freq: Every day | ORAL | Status: DC
  Administered 2019-12-28: 40 mg via ORAL
  Filled 2019-12-28: qty 1

## 2019-12-28 MED ORDER — FENTANYL 2500MCG IN NS 250ML (10MCG/ML) PREMIX INFUSION
50.0000 ug/h | INTRAVENOUS | Status: DC
  Administered 2019-12-28: 50 ug/h via INTRAVENOUS
  Filled 2019-12-28: qty 250

## 2019-12-28 MED ORDER — DEXTROSE 50 % IV SOLN
0.0000 mL | INTRAVENOUS | Status: DC | PRN
  Administered 2019-12-28: 50 mL via INTRAVENOUS
  Filled 2019-12-28: qty 50

## 2019-12-28 MED ORDER — TENECTEPLASE 50 MG IV KIT
50.0000 mg | PACK | INTRAVENOUS | Status: DC
  Filled 2019-12-28: qty 10

## 2019-12-28 MED ORDER — INSULIN ASPART 100 UNIT/ML ~~LOC~~ SOLN
1.0000 [IU] | SUBCUTANEOUS | Status: DC
  Administered 2019-12-28: 1 [IU] via SUBCUTANEOUS

## 2019-12-28 MED ORDER — PHENYLEPHRINE HCL-NACL 10-0.9 MG/250ML-% IV SOLN
INTRAVENOUS | Status: AC
  Filled 2019-12-28: qty 250

## 2019-12-28 MED ORDER — SODIUM BICARBONATE 8.4 % IV SOLN
INTRAVENOUS | Status: AC
  Filled 2019-12-28: qty 50

## 2019-12-28 MED ORDER — BARICITINIB 1 MG PO TABS
1.0000 mg | ORAL_TABLET | Freq: Every day | ORAL | Status: DC
  Administered 2019-12-28: 1 mg

## 2019-12-28 MED ORDER — EPINEPHRINE HCL 5 MG/250ML IV SOLN IN NS
0.5000 ug/min | INTRAVENOUS | Status: DC
  Filled 2019-12-28: qty 250

## 2019-12-28 MED ORDER — VECURONIUM BROMIDE 10 MG IV SOLR
INTRAVENOUS | Status: AC
  Filled 2019-12-28: qty 20

## 2019-12-28 NOTE — Progress Notes (Signed)
Transported pt to MRI @ 5:10am and from MRI @ 6:25am. Suctioned airway and mouth. No complications

## 2019-12-28 NOTE — Progress Notes (Signed)
[facility] Progress Note Patient Name: Judy English DOB: October 07, 1960 MRN: [id number]   Date of Service  12/28/2019  HPI/Events of Note  Urinary retention - Bladder scan with 650mL residual.   eICU Interventions  Plan" I/O cath PRN.      Intervention Category Major Interventions: Other:  Lysle Dingwall 12/28/2019, 3:36 AM

## 2019-12-28 NOTE — Progress Notes (Signed)
SLP Cancellation Note  Patient Details Name: Fiora Weill MRN: [id number] DOB: [birth year]   Cancelled treatment:       Reason Eval/Treat Not Completed: Patient not medically ready. Intubated. Will f/u for readiness.    Edda Orea, Katherene Ponto 12/28/2019, 7:41 AM

## 2019-12-28 NOTE — Progress Notes (Signed)
NAME:  Judy English, MRN:  [id number], DOB:  [birth year], LOS: 1 ADMISSION DATE:  01/01/2020, CONSULTATION DATE:  12/28/19 REFERRING MD:  Lorrin Goodell, CHIEF COMPLAINT:  Lethargy  Brief History   59yF with history of SLE who is intubated for airway protection in setting acute L MCA stroke now s/p revascularization acute hypoxic respiratory failure likely from covid-19 pneumonia  History of present illness   Judy English is a 59 y.o. female with PMH significant for DM2, HTN, HLD, Lupus on steroids who presents with 3 days of diarrhea, altered mental status with right-sided weakness and left gaze deviation.    History is largely obtained through chart review as the patient is intubated and sedated at time of my evaluation. Husband recently found to have covid-19. She had 3 days of diarrhea and had been lethargic for about a day and a half PTA. Seemed to be cognitively intact when talking with her son earlier on the day of admission. She was BIBEMS and noted to be hypoxic, weak on R side RUE>RLE. CTA revealed L MCA occlusion and she was taken for revascularization. We are consulted for vent management and likely covid-19 pneumonia.  Past Medical History  SLE DM2 OSA  Significant Hospital Events   01/13/2020:Lt common carotid arteriogram followed by rebvascularization of occluded Lt MCA M 1 seg with x 2 paasses with Tiger21 retriver and x 1 pass with SolitaireX 35mmx 40 mm retriever and aspiration achieving a TICI 3 revascularization nd rescue angioplasty of M1 seg and prox inferior division achieving a TICI 2C revascularization  Consults:  PCCM  Procedures:  As above  Significant Diagnostic Tests:  CTA H/N: occlusion L MCA M1  Micro Data:  None  Antimicrobials:  Vanc 9/3>> Cefepime 9/3>> flagyl 9/3>>  Interim history/subjective:  Patient admitted to the ICU. No acute events since arrival to the ICU.   Objective   Blood pressure 130/81, pulse 76, temperature 97.7 F (36.5  C), temperature source Axillary, resp. rate (!) 31, height 5\' 9"  (1.753 m), weight 123.5 kg, last menstrual period 09/08/2016, SpO2 96 %.    Vent Mode: PRVC FiO2 (%):  [60 %-100 %] 100 % Set Rate:  [14 bmp] 14 bmp Vt Set:  [400 mL-520 mL] 400 mL PEEP:  [8 cmH20] 8 cmH20 Plateau Pressure:  [23 cmH20] 23 cmH20   Intake/Output Summary (Last 24 hours) at 12/28/2019 1155 Last data filed at 12/28/2019 1000 Gross per 24 hour  Intake 3188.61 ml  Output 1459 ml  Net 1729.61 ml   Filed Weights   01/12/2020 1416 01/12/2020 2200  Weight: 123.4 kg 123.5 kg    Examination: General: intubated, sedated HENT: NCAT, dry MM Lungs: mechanical breath sounds, tachypneic. No wheezes or rhonchi Cardiovascular: RRR, no murmur, no JVD  Abdomen: soft, nontender, normal bowel sounds Extremities: warm, trace edema Neuro: sedated  Resolved Hospital Problem list     Assessment & Plan:   Acute Hypoxemic Respiratory Failure  In setting of acute stroke and COVID 19 Pneumonia - Continue ventilator support, currently on 100% FiO2 and PEEP 8 - Check ABG, wean FiO2 as able - Continue remdesevir, barictinib and decadron - Send resp culture today - check procalcitonin - Stop vancomycin MRSA negative - Stop flagyl - Continue cefepime - Sedation with propofol and fentanyl  Left MCA Infarct s/p revascularization - Neurology following, will follow up recommendations for medications and blood pressure goals   Hyperlgycemia - Continue insulin drip - Will transition tomorrow to long acting subq insulin based on  her need over the next 24 hours along with SSI  Best practice:  Diet: NPO Pain/Anxiety/Delirium protocol (if indicated): yes VAP protocol (if indicated): yes DVT prophylaxis: holding sq heparin immediately post revascularization  GI prophylaxis: yes Glucose control: Insulin drip Mobility: bed-level Code Status: Full Family Communication: Will update family later today Disposition: MICU  Labs     CBC: Recent Labs  Lab 01/16/2020 1352 01/01/2020 1502 12/28/19 0655  WBC 5.2  --  3.9*  NEUTROABS 4.5  --  3.4  HGB 12.5 12.6 10.6*  HCT 37.8 37.0 32.5*  MCV 81.8  --  85.3  PLT 138*  --  149*    Basic Metabolic Panel: Recent Labs  Lab 12/31/2019 1352 01/07/2020 1502 12/28/19 0655  NA 133* 136 135  K 4.9 5.0 4.7  CL 104 109 108  CO2 17*  --  14*  GLUCOSE 354* 330* 378*  BUN 41* 42* 44*  CREATININE 1.98* 1.90* 2.51*  CALCIUM 8.3*  --  7.6*   GFR: Estimated Creatinine Clearance: 34.8 mL/min (A) (by C-G formula based on SCr of 2.51 mg/dL (H)). Recent Labs  Lab 01/01/2020 1352 12/26/2019 1445 12/28/19 0655  WBC 5.2  --  3.9*  LATICACIDVEN  --  2.0*  --     Liver Function Tests: Recent Labs  Lab 12/26/2019 1352  AST 63*  ALT 20  ALKPHOS 54  BILITOT 0.7  PROT 7.3  ALBUMIN 3.0*   No results for input(s): LIPASE, AMYLASE in the last 168 hours. No results for input(s): AMMONIA in the last 168 hours.  ABG    Component Value Date/Time   TCO2 18 (L) 01/11/2020 1502     Coagulation Profile: Recent Labs  Lab 12/30/2019 1352  INR 1.2    Cardiac Enzymes: No results for input(s): CKTOTAL, CKMB, CKMBINDEX, TROPONINI in the last 168 hours.  HbA1C: Hgb A1c MFr Bld  Date/Time Value Ref Range Status  12/28/2019 04:09 AM 10.7 (H) 4.8 - 5.6 % Final    Comment:    (NOTE) Pre diabetes:          5.7%-6.4%  Diabetes:              >6.4%  Glycemic control for   <7.0% adults with diabetes     CBG: Recent Labs  Lab 12/28/19 0645 12/28/19 0736 12/28/19 0856 12/28/19 0947 12/28/19 1057  GLUCAP 372* 328* 276* 275* 243*     Critical care time: [place], MD [facility] Office: [phone]   See Amion for Pager Details

## 2019-12-28 NOTE — Progress Notes (Signed)
  Echocardiogram 2D Echocardiogram has been performed.  Judy English 12/28/2019, 3:52 PM

## 2019-12-28 NOTE — Progress Notes (Signed)
Referring Physician(s): Code Stroke - Dr. Erlinda Hong.   Supervising Physician: Luanne Bras  Patient Status:  [facility] - In-pt  Chief Complaint:  Left M1 MCA occlusion with revascularization performed on 9.3.21. None patient sedated  Subjective:  Unable to assess due to sedation  Allergies: Acetaminophen, Ibuprofen, Latex, and Omnipaque [iohexol]  Medications: Prior to Admission medications   Medication Sig Start Date End Date Taking? Authorizing Provider  albuterol (PROVENTIL HFA;VENTOLIN HFA) 108 (90 BASE) MCG/ACT inhaler Inhale 2 puffs into the lungs every 6 (six) hours as needed. For shortness of breath and wheezing     [provider]  amLODipine (NORVASC) 5 MG tablet Take 5 mg by mouth daily. 11/29/18   [provider]  aspirin 81 MG chewable tablet Chew 81 mg by mouth daily. 02/18/19   [provider]  atenolol (TENORMIN) 50 MG tablet Take 50 mg by mouth daily. 02/18/19   [provider]  BD ULTRA-FINE PEN NEEDLES 29G X 12.7MM MISC USE TO INJECT INSULIN UP TO TID 01/24/19   [provider]  buPROPion (WELLBUTRIN XL) 150 MG 24 hr tablet Take 150 mg by mouth 3 (three) times daily as needed. 11/29/18   [provider]  BYDUREON 2 MG PEN Inject 2 mg as directed once a week. 11/29/18   [provider]  celecoxib (CELEBREX) 100 MG capsule  02/18/19   [provider]  chlorthalidone (HYGROTON) 25 MG tablet Take 50 mg by mouth daily. 11/29/18   [provider]  cyclobenzaprine (FLEXERIL) 10 MG tablet Take 1 tablet (10 mg total) by mouth 2 (two) times daily as needed for muscle spasms. 08/30/16   Drenda Freeze, MD  FLUoxetine (PROZAC) 40 MG capsule Take 40 mg by mouth daily.    [provider]  fluticasone (FLONASE) 50 MCG/ACT nasal spray Place 2 sprays into both nostrils daily. 04/02/18   Horton, Barbette Hair, MD  Fluticasone-Salmeterol (ADVAIR) 500-50 MCG/DOSE AEPB Inhale 1 puff into the lungs every 12  (twelve) hours.      [provider]  furosemide (LASIX) 40 MG tablet Take 40 mg by mouth daily. 02/21/19   [provider]  gabapentin (NEURONTIN) 600 MG tablet Take 1 tablet by mouth 3 (three) times daily. 11/29/18   [provider]  hydrOXYzine (ATARAX/VISTARIL) 25 MG tablet Take 25 mg by mouth 4 (four) times daily.    [provider]  insulin aspart (NOVOLOG) 100 UNIT/ML injection Inject 10 Units into the skin 4 (four) times daily as needed for high blood sugar (sliding scale). 09/02/15   Fredia Sorrow, MD  Insulin Glargine (TOUJEO SOLOSTAR) 300 UNIT/ML SOPN Inject 90 Units into the skin 2 (two) times daily. 01/29/16   Recardo Evangelist, PA-C  JANUVIA 100 MG tablet Take 100 mg by mouth daily. 11/29/18   [provider]  LORazepam (ATIVAN) 0.5 MG tablet Take 0.5 mg by mouth 2 (two) times daily as needed for anxiety. 05/02/18   [provider]  omeprazole (PRILOSEC) 40 MG capsule Take 40 mg by mouth daily.    [provider]  ondansetron (ZOFRAN) 4 MG tablet Take 1 tablet (4 mg total) by mouth every 6 (six) hours. 07/07/18   Law, Bea Graff, PA-C  oxyCODONE-acetaminophen (PERCOCET) 10-325 MG tablet Take 1 tablet by mouth every 6 (six) hours as needed for pain. 06/28/18   [provider]  sertraline (ZOLOFT) 50 MG tablet Take 1 tablet (50 mg total) by mouth daily. 11/06/19   Merian Capron, MD  sodium chloride (OCEAN) 0.65 % SOLN nasal spray Place 1 spray into both nostrils as needed for congestion. 04/02/18   Horton, Barbette Hair, MD  triamcinolone lotion (KENALOG) 0.1 % APP EXT AA TID FOR 7 DAYS 02/18/19   [provider]     Vital Signs: BP 119/73   Pulse 76   Temp 97.7 F (36.5 C) (Axillary)   Resp (!) 29   Ht 5\' 9"  (1.753 m)   Wt 272 lb 4.3 oz (123.5 kg)   LMP 09/08/2016   SpO2 96%   BMI 40.21 kg/m   Physical Exam Vitals and nursing note reviewed.  Constitutional:      Appearance: She is well-developed.      Comments: Sedated  HENT:     Head: Normocephalic and atraumatic.  Cardiovascular:     Rate and Rhythm: Normal rate and regular rhythm.     Comments: Right femoral ascess site is soft with no active bleeding and no appreciable pseudoaneurysm. Dressing is C/D/I  Pulmonary:     Effort: Pulmonary effort is normal.     Comments: On ventilator Skin:    General: Skin is warm and dry.  Neurological:     Comments: Unable to asses. Patient sedated unable to follow commands.     Imaging: MR ANGIO HEAD WO CONTRAST  Result Date: 12/28/2019 CLINICAL DATA:  Stroke follow-up EXAM: MRI HEAD WITHOUT CONTRAST MRA HEAD WITHOUT CONTRAST TECHNIQUE: Multiplanar, multiecho pulse sequences of the brain and surrounding structures were obtained without intravenous contrast. Angiographic images of the head were obtained using MRA technique without contrast. COMPARISON:  CTA head neck 01/14/2020 FINDINGS: MRI HEAD FINDINGS Brain: Large acute infarct occupying most of the left MCA territory. Moderate cytotoxic edema. Multifocal hyperintense T2-weighted signal within the white matter. Normal volume of CSF spaces. No chronic microhemorrhage. Normal midline structures. 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 Vascular: Normal flow voids. Skull and upper cervical spine: Normal marrow signal. Sinuses/Orbits: Negative. Other: None. MRA HEAD FINDINGS POSTERIOR CIRCULATION: --Vertebral arteries: Normal V4 segments. --Inferior cerebellar arteries: Normal. --Basilar artery: Normal. --Superior cerebellar arteries: Normal. --Posterior cerebral arteries: Normal. ANTERIOR CIRCULATION: --Intracranial internal carotid arteries: Normal. --Anterior cerebral arteries (ACA): Normal. Both A1 segments are present. Patent anterior communicating artery (a-comm). --Middle cerebral arteries (MCA): Left MCA M1 segment is occluded proximally. Right MCA is patent. No hemodynamically significant stenosis. IMPRESSION: 1. Large acute infarct occupying most  of the left MCA territory. No hemorrhage or mass effect. 2. Left MCA M1 segment is occluded proximally. Electronically Signed   By: Ulyses Jarred M.D.   On: 12/28/2019 06:12   MR BRAIN WO CONTRAST  Result Date: 12/28/2019 CLINICAL DATA:  Stroke follow-up EXAM: MRI HEAD WITHOUT CONTRAST MRA HEAD WITHOUT CONTRAST TECHNIQUE: Multiplanar, multiecho pulse sequences of the brain and surrounding structures were obtained without intravenous contrast. Angiographic images of the head were obtained using MRA technique without contrast. COMPARISON:  CTA head neck 01/22/2020 FINDINGS: MRI HEAD FINDINGS Brain: Large acute infarct occupying most of the left MCA territory. Moderate cytotoxic edema. Multifocal hyperintense T2-weighted signal within the white matter. Normal volume of CSF spaces. No chronic microhemorrhage. Normal midline structures. 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 Vascular: Normal flow voids. Skull and upper cervical spine: Normal marrow signal. Sinuses/Orbits: Negative. Other: None. MRA HEAD FINDINGS POSTERIOR CIRCULATION: --Vertebral arteries: Normal V4 segments. --Inferior cerebellar arteries: Normal. --Basilar artery: Normal. --Superior cerebellar arteries: Normal. --Posterior cerebral arteries: Normal. ANTERIOR CIRCULATION: --Intracranial internal carotid arteries: Normal. --Anterior cerebral arteries (ACA): Normal. Both A1  segments are present. Patent anterior communicating artery (a-comm). --Middle cerebral arteries (MCA): Left MCA M1 segment is occluded proximally. Right MCA is patent. No hemodynamically significant stenosis. IMPRESSION: 1. Large acute infarct occupying most of the left MCA territory. No hemorrhage or mass effect. 2. Left MCA M1 segment is occluded proximally. Electronically Signed   By: Ulyses Jarred M.D.   On: 12/28/2019 06:12   CT CEREBRAL PERFUSION W CONTRAST  Result Date: 12/28/2019 CLINICAL DATA:  Neuro deficit, acute, stroke suspected. EXAM: CT ANGIOGRAPHY HEAD AND NECK  CT PERFUSION BRAIN TECHNIQUE: Multidetector CT imaging of the head and neck was performed using the standard protocol during bolus administration of intravenous contrast. Multiplanar CT image reconstructions and MIPs were obtained to evaluate the vascular anatomy. Carotid stenosis measurements (when applicable) are obtained utilizing NASCET criteria, using the distal internal carotid diameter as the denominator. Multiphase CT imaging of the brain was performed following IV bolus contrast injection. Subsequent parametric perfusion maps were calculated using RAPID software. CONTRAST:  122mL OMNIPAQUE IOHEXOL 350 MG/ML SOLN COMPARISON:  Same day head CT. FINDINGS: CTA NECK FINDINGS Aortic arch: Imaged portion shows no evidence of aneurysm or dissection. No significant stenosis of the major arch vessel origins. Right carotid system: No evidence of dissection, stenosis (50% or greater) or occlusion. Retropharyngeal course. Left carotid system: No evidence of dissection, stenosis (50% or greater) or occlusion. Vertebral arteries: Codominant. No evidence of dissection, stenosis (50% or greater) or occlusion. Skeleton: No acute osseous abnormality. Degenerative changes of the cervical spine with anterior osteophytes. Other neck: Borderline enlarged lymph nodes, nonspecific but likely reactive. Upper chest: Partially imaged extensive peripheral predominant ground-glass opacification with consolidation Review of the MIP images confirms the above findings CTA HEAD FINDINGS Anterior circulation: Occlusion of the left M1 MCA. Opacification of M2 branches likely from collateral flow. No aneurysm. Posterior circulation: There is moderate to advanced stenosis of the right P1 PCA and the right P2 PCA. Additionally, there is moderate to advanced stenosis of the left P2 PCA. No aneurysm. Venous sinuses: As permitted by contrast timing, patent. Review of the MIP images confirms the above findings CT Brain Perfusion Findings: CBF (<30%)  Volume: 38mL (infarct core). Located within the posterior aspect of the left MCA territory. Perfusion (Tmax>6.0s) volume: 132mL Mismatch Volume: 30mL (penumbra) IMPRESSION: 1. Occlusion of the left M1 MCA with a large area of left MCA territory penumbra (79 mL) and smaller core infarct. 2. Multifocal moderate to advanced stenosis of bilateral posterior cerebral arteries, as detailed above. 3. No significant (greater than 50%) stenosis in the neck. 4. Partially imaged extensive peripheral predominant ground-glass opacification with consolidation, compatible with multifocal pneumonia (likely COVID pneumonia). Code stroke imaging results were communicated on 01/02/2020 at 3:51 pm to provider Dr. Milas Gain via telephone, who verbally acknowledged these results. Electronically Signed   By: Margaretha Sheffield MD   On: 01/12/2020 16:07   DG CHEST PORT 1 VIEW  Result Date: 01/12/2020 CLINICAL DATA:  Orogastric tube placement, COVID pneumonia EXAM: PORTABLE CHEST 1 VIEW COMPARISON:  2:16 p.m. FINDINGS: Endotracheal tube is been placed 3.6 cm above the carina. Orogastric tube extends into the upper abdomen, beyond the margin of the examination. Extensive left mid and lower lung zone airspace infiltrate is again identified, likely infectious or inflammatory. Cardiac size is mildly enlarged, stable when accounting for rotation on this examination. No pneumothorax or pleural effusion. IMPRESSION: 1. Endotracheal tube tip 3.6 cm above the carina. 2. Orogastric tube extends into the upper abdomen, beyond the margin of  the examination. And Electronically Signed   By: Fidela Salisbury MD   On: 01/19/2020 22:52   DG Chest Port 1 View  Result Date: 01/17/2020 CLINICAL DATA:  Sepsis? EXAM: PORTABLE CHEST 1 VIEW COMPARISON:  Chest x-ray 05/20/2019, CT chest 12/06/2013 FINDINGS: Enlarged cardiomediastinal silhouette with some attribution to AP portable technique as well as low lung volumes. Bilateral patchy airspace opacities that are more  prominent peripherally and within the lower lung zones. No pulmonary edema. No pleural effusion. No pneumothorax. No acute osseous finding. IMPRESSION: Pulmonary findings suspicious for COVID-19. Enlarged cardiac silhouette likely due to technique and low lung volumes. Electronically Signed   By: Iven Finn M.D.   On: 01/02/2020 14:41   CT HEAD CODE STROKE WO CONTRAST  Result Date: 01/20/2020 CLINICAL DATA:  Code stroke. EXAM: CT HEAD WITHOUT CONTRAST TECHNIQUE: Contiguous axial images were obtained from the base of the skull through the vertex without intravenous contrast. COMPARISON:  MRI July 10, 2008 FINDINGS: Brain: No evidence of acute large vascular territory infarct. Small remote right cerebellar lacunar infarct. Apparent hypodensity in the left cerebellum is favored to relate to streak artifact. No convincing evidence of acute large vascular territory infarct. Patchy white matter hypodensity is nonspecific but likely relates to chronic microvascular ischemic disease. No acute hemorrhage. No hydrocephalus. No mass lesion or abnormal mass effect. Vascular: No hyperdense vessel identified. Intracranial atherosclerosis. Skull: Normal. Negative for fracture or focal lesion. Sinuses/Orbits: Chronic changes in the inferior right maxillary sinus with mineralization. Otherwise, the sinuses are clear. Negative orbits. ASPECTS ([place] Stroke Program Early CT Score): 10 IMPRESSION: 1. No evidence of acute intracranial abnormality. 2. Presumed chronic microvascular ischemic disease and small remote right cerebellar lacunar infarct. 3. ASPECTS is 10 Code stroke imaging results were communicated on 01/20/2020 at 2:56 pm to provider Dr. Milas Gain Via telephone, who verbally acknowledged these results. Electronically Signed   By: Margaretha Sheffield MD   On: 01/10/2020 15:02   CT ANGIO HEAD CODE STROKE  Result Date: 01/13/2020 CLINICAL DATA:  Neuro deficit, acute, stroke suspected. EXAM: CT ANGIOGRAPHY HEAD AND NECK CT  PERFUSION BRAIN TECHNIQUE: Multidetector CT imaging of the head and neck was performed using the standard protocol during bolus administration of intravenous contrast. Multiplanar CT image reconstructions and MIPs were obtained to evaluate the vascular anatomy. Carotid stenosis measurements (when applicable) are obtained utilizing NASCET criteria, using the distal internal carotid diameter as the denominator. Multiphase CT imaging of the brain was performed following IV bolus contrast injection. Subsequent parametric perfusion maps were calculated using RAPID software. CONTRAST:  19mL OMNIPAQUE IOHEXOL 350 MG/ML SOLN COMPARISON:  Same day head CT. FINDINGS: CTA NECK FINDINGS Aortic arch: Imaged portion shows no evidence of aneurysm or dissection. No significant stenosis of the major arch vessel origins. Right carotid system: No evidence of dissection, stenosis (50% or greater) or occlusion. Retropharyngeal course. Left carotid system: No evidence of dissection, stenosis (50% or greater) or occlusion. Vertebral arteries: Codominant. No evidence of dissection, stenosis (50% or greater) or occlusion. Skeleton: No acute osseous abnormality. Degenerative changes of the cervical spine with anterior osteophytes. Other neck: Borderline enlarged lymph nodes, nonspecific but likely reactive. Upper chest: Partially imaged extensive peripheral predominant ground-glass opacification with consolidation Review of the MIP images confirms the above findings CTA HEAD FINDINGS Anterior circulation: Occlusion of the left M1 MCA. Opacification of M2 branches likely from collateral flow. No aneurysm. Posterior circulation: There is moderate to advanced stenosis of the right P1 PCA and the right P2 PCA. Additionally,  there is moderate to advanced stenosis of the left P2 PCA. No aneurysm. Venous sinuses: As permitted by contrast timing, patent. Review of the MIP images confirms the above findings CT Brain Perfusion Findings: CBF (<30%)  Volume: 54mL (infarct core). Located within the posterior aspect of the left MCA territory. Perfusion (Tmax>6.0s) volume: 130mL Mismatch Volume: 31mL (penumbra) IMPRESSION: 1. Occlusion of the left M1 MCA with a large area of left MCA territory penumbra (79 mL) and smaller core infarct. 2. Multifocal moderate to advanced stenosis of bilateral posterior cerebral arteries, as detailed above. 3. No significant (greater than 50%) stenosis in the neck. 4. Partially imaged extensive peripheral predominant ground-glass opacification with consolidation, compatible with multifocal pneumonia (likely COVID pneumonia). Code stroke imaging results were communicated on 01/20/2020 at 3:51 pm to provider Dr. Milas Gain via telephone, who verbally acknowledged these results. Electronically Signed   By: Margaretha Sheffield MD   On: 01/22/2020 16:07   CT ANGIO NECK CODE STROKE  Result Date: 12/30/2019 CLINICAL DATA:  Neuro deficit, acute, stroke suspected. EXAM: CT ANGIOGRAPHY HEAD AND NECK CT PERFUSION BRAIN TECHNIQUE: Multidetector CT imaging of the head and neck was performed using the standard protocol during bolus administration of intravenous contrast. Multiplanar CT image reconstructions and MIPs were obtained to evaluate the vascular anatomy. Carotid stenosis measurements (when applicable) are obtained utilizing NASCET criteria, using the distal internal carotid diameter as the denominator. Multiphase CT imaging of the brain was performed following IV bolus contrast injection. Subsequent parametric perfusion maps were calculated using RAPID software. CONTRAST:  131mL OMNIPAQUE IOHEXOL 350 MG/ML SOLN COMPARISON:  Same day head CT. FINDINGS: CTA NECK FINDINGS Aortic arch: Imaged portion shows no evidence of aneurysm or dissection. No significant stenosis of the major arch vessel origins. Right carotid system: No evidence of dissection, stenosis (50% or greater) or occlusion. Retropharyngeal course. Left carotid system: No evidence of  dissection, stenosis (50% or greater) or occlusion. Vertebral arteries: Codominant. No evidence of dissection, stenosis (50% or greater) or occlusion. Skeleton: No acute osseous abnormality. Degenerative changes of the cervical spine with anterior osteophytes. Other neck: Borderline enlarged lymph nodes, nonspecific but likely reactive. Upper chest: Partially imaged extensive peripheral predominant ground-glass opacification with consolidation Review of the MIP images confirms the above findings CTA HEAD FINDINGS Anterior circulation: Occlusion of the left M1 MCA. Opacification of M2 branches likely from collateral flow. No aneurysm. Posterior circulation: There is moderate to advanced stenosis of the right P1 PCA and the right P2 PCA. Additionally, there is moderate to advanced stenosis of the left P2 PCA. No aneurysm. Venous sinuses: As permitted by contrast timing, patent. Review of the MIP images confirms the above findings CT Brain Perfusion Findings: CBF (<30%) Volume: 66mL (infarct core). Located within the posterior aspect of the left MCA territory. Perfusion (Tmax>6.0s) volume: 141mL Mismatch Volume: 82mL (penumbra) IMPRESSION: 1. Occlusion of the left M1 MCA with a large area of left MCA territory penumbra (79 mL) and smaller core infarct. 2. Multifocal moderate to advanced stenosis of bilateral posterior cerebral arteries, as detailed above. 3. No significant (greater than 50%) stenosis in the neck. 4. Partially imaged extensive peripheral predominant ground-glass opacification with consolidation, compatible with multifocal pneumonia (likely COVID pneumonia). Code stroke imaging results were communicated on 12/25/2019 at 3:51 pm to provider Dr. Milas Gain via telephone, who verbally acknowledged these results. Electronically Signed   By: Margaretha Sheffield MD   On: 12/28/2019 16:07    Labs:  CBC: Recent Labs    01/03/19 1740 01/03/19 8144 05/20/19  0130 01/05/2020 1352 01/11/2020 1502 12/28/19 0655  WBC  5.5  --  6.4 5.2  --  3.9*  HGB 11.8*   < > 11.4* 12.5 12.6 10.6*  HCT 36.3   < > 34.4* 37.8 37.0 32.5*  PLT 200  --  214 138*  --  149*   < > = values in this interval not displayed.    COAGS: Recent Labs    01/04/2020 1352  INR 1.2  APTT 22*    BMP: Recent Labs    01/03/19 0332 01/03/19 0332 05/20/19 0130 12/30/2019 1352 01/04/2020 1502 12/28/19 0655  NA 138   < > 137 133* 136 135  K 3.4*   < > 4.0 4.9 5.0 4.7  CL 102   < > 104 104 109 108  CO2 26  --  23 17*  --  14*  GLUCOSE 280*   < > 360* 354* 330* 378*  BUN 20   < > 23* 41* 42* 44*  CALCIUM 8.9  --  8.7* 8.3*  --  7.6*  CREATININE 1.46*   < > 1.56* 1.98* 1.90* 2.51*  GFRNONAA 40*  --  37* 27*  --  21*  GFRAA 46*  --  43* 32*  --  24*   < > = values in this interval not displayed.    LIVER FUNCTION TESTS: Recent Labs    01/03/19 0840 01/07/2020 1352  BILITOT 0.4 0.7  AST 25 63*  ALT 21 20  ALKPHOS 59 54  PROT 7.3 7.3  ALBUMIN 3.7 3.0*    Assessment and Plan:  59 y.o, female inpatient. History of DM, HTN, HLD, myalgia, positive ANA. Per documentation patient states that she has Lupus and is being followed by Rheumatology at [facility]. Presented to the ED at [facility] on 9.3.24 with 3 days of diarrhea, AMS, right sided weakness, aphasia and left gaze deviation. Patient COVID + with likely COVID PNA. CT Stroke reads occlusion of the left M1 MCA with a large area of left MCA territory penumbra (79 mL) and smaller core infarct. Left common carotid arteriogram followed by revasculation fo occluded Lt MCA M1 segment with 2 passes achieving TCI 3 revasculation and rescue angioplasty of M1 segment and proximal inferior division achieving TICI 2C revasculation. 8 Fr angioseal used for closure performed by Dr. Dora Sims on 9.3.24.   Access site is soft with no active bleeding and no appreciable pseudoaneurysm. Dressing is C/D/I.   Patient remains intubated on the ventilator due to COVID status. Unable to assess neurological  status at this time. Attempt to lower the sedation unsuccessful due to patient agitation. Bedside RN confirmed similar results. Patient's  left arm currently restrained and no movement noted to the patient's right arm. Bilateral pedal pulses are palpable. WBC is 3.9, Hgb 10.6, BUN 44, Cr 2.51  All other labs and medications are within acceptable parameters.  Neuro IR to continue to follow.    Electronically Signed: Jacqualine Mau, NP 12/28/2019, 11:53 AM   I spent a total of 15 Minutes at the patient's bedside AND on the patient's hospital floor or unit, greater than 50% of which was counseling/coordinating care for Left M1 MCA occlusion with revascularization.

## 2019-12-28 NOTE — Progress Notes (Signed)
[facility] Progress Note Patient Name: Aden Youngman DOB: 16-Jun-1960 MRN: [id number]   Date of Service  12/28/2019  HPI/Events of Note  Patient went into cardiac arrest and required ACLS protocol for PEA arrest.  eICU Interventions  Code management. Dr. Collier Bullock of the PCCM ground crew arrived to take over the code.        Kerry Kass [place] 12/28/2019, 11:40 PM

## 2019-12-28 NOTE — Progress Notes (Signed)
[facility] Progress Note Patient Name: Judy English DOB: 1961/03/17 MRN: [id number]   Date of Service  12/28/2019  HPI/Events of Note  Hyperglycemia - Blood glucose = 459 --> 396 --> 412.   eICU Interventions  Plan: 1. Insulin IV infusion per [employer] protocol.      Intervention Category Major Interventions: Hyperglycemia - active titration of insulin therapy  Chera Slivka [place] 12/28/2019, 4:08 AM

## 2019-12-28 NOTE — Progress Notes (Signed)
STROKE TEAM PROGRESS NOTE   INTERVAL HISTORY Her RN is at the bedside.  Pt still intubated on sedation, unresponsive. Still critical with respiratory failure, ABG showed metabolic acidosis with low PO2. MRI showed large left MCA infarct involving almost entire MCA territory. MRA showed continued left M1 occlusion. Will repeat CT in am. BP on the low end, put on pressor. On Abx with rocephin.   OBJECTIVE Vitals:   12/28/19 1030 12/28/19 1100 12/28/19 1130 12/28/19 1157  BP: 129/74 119/73 130/81   Pulse: 75 76    Resp: (!) 30 (!) 29 (!) 31   Temp:    98.2 F (36.8 C)  TempSrc:    Oral  SpO2: 97% 96%    Weight:      Height:        CBC:  Recent Labs  Lab 01/04/2020 1352 01/02/2020 1352 01/05/2020 1502 12/28/19 0655  WBC 5.2  --   --  3.9*  NEUTROABS 4.5  --   --  3.4  HGB 12.5   < > 12.6 10.6*  HCT 37.8   < > 37.0 32.5*  MCV 81.8  --   --  85.3  PLT 138*  --   --  149*   < > = values in this interval not displayed.    Basic Metabolic Panel:  Recent Labs  Lab 01/17/2020 1352 01/10/2020 1352 01/15/2020 1502 12/28/19 0655  NA 133*   < > 136 135  K 4.9   < > 5.0 4.7  CL 104   < > 109 108  CO2 17*  --   --  14*  GLUCOSE 354*   < > 330* 378*  BUN 41*   < > 42* 44*  CREATININE 1.98*   < > 1.90* 2.51*  CALCIUM 8.3*  --   --  7.6*   < > = values in this interval not displayed.    Lipid Panel:     Component Value Date/Time   CHOL 191 12/28/2019 0409   TRIG 453 (H) 12/28/2019 1014   HDL 20 (L) 12/28/2019 0409   CHOLHDL 9.6 12/28/2019 0409   VLDL UNABLE TO CALCULATE IF TRIGLYCERIDE OVER 400 mg/dL 12/28/2019 0409   LDLCALC UNABLE TO CALCULATE IF TRIGLYCERIDE OVER 400 mg/dL 12/28/2019 0409   HgbA1c:  Lab Results  Component Value Date   HGBA1C 10.7 (H) 12/28/2019   Urine Drug Screen:     Component Value Date/Time   LABOPIA NONE DETECTED 12/28/2019 0409   COCAINSCRNUR NONE DETECTED 12/28/2019 0409   LABBENZ NONE DETECTED 12/28/2019 0409   AMPHETMU NONE DETECTED 12/28/2019  0409   THCU NONE DETECTED 12/28/2019 0409   LABBARB NONE DETECTED 12/28/2019 0409    Alcohol Level     Component Value Date/Time   ETH <10 12/26/2019 1352    IMAGING  MR BRAIN WO CONTRAST MR ANGIO HEAD WO CONTRAST 12/28/2019 IMPRESSION:  1. Large acute infarct occupying most of the left MCA territory. No hemorrhage or mass effect.  2. Left MCA M1 segment is occluded proximally.   CT ANGIO HEAD CODE STROKE CT ANGIO NECK CODE STROKE CT CEREBRAL PERFUSION W CONTRAST 01/08/2020 IMPRESSION:  1. Occlusion of the left M1 MCA with a large area of left MCA territory penumbra (79 mL) and smaller core infarct.  2. Multifocal moderate to advanced stenosis of bilateral posterior cerebral arteries, as detailed above.  3. No significant (greater than 50%) stenosis in the neck.  4. Partially imaged extensive peripheral predominant ground-glass opacification with consolidation, compatible  with multifocal pneumonia (likely COVID pneumonia).   DG CHEST PORT 1 VIEW 01/05/2020 IMPRESSION: 1. Endotracheal tube tip 3.6 cm above the carina.  2. Orogastric tube extends into the upper abdomen, beyond the margin of the examination.   DG Chest Port 1 View 01/11/2020 IMPRESSION:  Pulmonary findings suspicious for COVID-19. Enlarged cardiac silhouette likely due to technique and low lung volumes.   CT HEAD CODE STROKE WO CONTRAST 01/13/2020 IMPRESSION:  1. No evidence of acute intracranial abnormality.  2. Presumed chronic microvascular ischemic disease and small remote right cerebellar lacunar infarct.  3. ASPECTS is 10    Neuro Interventional Radiology - Cerebral Angiogram with Intervention - Dr Estanislado Pandy 12/30/2019 S/P Lt common carotid arteriogram followed by revascularization of occluded Lt MCA M 1 seg with x 2 paasses with Tiger21 retriver and x 1 pass with SolitaireX 64mmx 40 mm retriever and aspiration achieving a TICI 3 revascularization and rescue angioplasty of M1 seg and prox inferior division  achieving a TICI 2C revascularization.  Transthoracic Echocardiogram  1. Left ventricular ejection fraction, by estimation, is 60 to 65%. The  left ventricle has normal function. The left ventricle has no regional  wall motion abnormalities. There is severe asymmetric left ventricular  hypertrophy of the basal-septal  segment. Left ventricular diastolic parameters are indeterminate.  2. Right ventricular systolic function is normal. The right ventricular  size is mildly enlarged.  3. The mitral valve is normal in structure. No evidence of mitral valve  regurgitation.  4. The aortic valve was not well visualized. Aortic valve regurgitation  is not visualized. No aortic stenosis is present.   Bilateral Lower Extremity Venous Dopplers - pending  ECG - SR rate 96 BPM. (See cardiology reading for complete details)   PHYSICAL EXAM  Temp:  [94.2 F (34.6 C)-99.2 F (37.3 C)] 99.2 F (37.3 C) (09/04 1941) Pulse Rate:  [64-92] 92 (09/04 1830) Resp:  [15-36] 15 (09/04 2300) BP: (85-160)/(59-88) 85/64 (09/04 2200) SpO2:  [92 %-100 %] 95 % (09/04 2018) Arterial Line BP: (75-167)/(19-83) 101/19 (09/04 2300) FiO2 (%):  [100 %] 100 % (09/04 2018)  General - Well nourished, well developed, intubated on sedation.  Ophthalmologic - fundi not visualized due to noncooperation.  Cardiovascular - Regular rate and rhythm.  Neuro - intubated on sedation, eyes closed, not following commands. With forced eye opening, eyes in left gaze position, not blinking to visual threat, doll's eyes present but with left gaze preference, not tracking, PERRL. Corneal reflex weak bilaterally, L>R, gag and cough present. Breathing over the vent.  Facial symmetry not able to test due to ET tube.  Tongue protrusion not cooperative. On pain stimulation, only mild extension of right UE, otherwise no movement on pain stimulatoin. DTR 1+ and no babinski. Sensation, coordination and gait not  tested.   ASSESSMENT/PLAN Ms. Samyra Limb is a 59 y.o. female with history of DM2, CAD (previous MI), Asthma, obesity, HTN, HLD, Lupus (on steroids) who presents with 3 days of diarrhea, altered mental status with right-sided weakness and left gaze deviation. She did not receive IV t-PA due to late presentation (>4.5 hours from time of onset). CT angio with left MCA M1 occlusion and CTP with a left MCA territory mismatch and a large penumbra of 79 mL and a smaller core. Thrombectomy - revascularization of occluded Lt MCA M 1 seg with x 2 paasses with Tiger21 retriver and x 1 pass with SolitaireX 77mmx 40 mm retriever and aspiration achieving a TICI 3 revascularization and rescue  angioplasty of M1 seg and prox inferior division achieving a TICI 2C revascularization.  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 Large vessel disease given uncontrolled risk factors vs. cardioemoblic source  CT Head - No evidence of acute intracranial abnormality. Presumed chronic microvascular ischemic disease and small remote right cerebellar lacunar infarct. ASPECTS is 10    CTA H&N - Multifocal moderate to advanced stenosis of bilateral posterior cerebral arteries. No significant (greater than 50%) stenosis in the neck.   CT Perfusion - positive for penumbra   IR with left M1 occlusion s/p IR with TICI3, however, constant reocclusion -> angioplasty with TICI2C reperfusion  MRI head - Large acute infarct occupying most of the left MCA territory  MRA head - Left MCA M1 segment is occluded proximally.  CT repeat pending  EF 60-65%  2D Echo - pending  LE venous doppler pending  Sars Corona Virus 2 - positive  LDL - 97.2  TG 543  HgbA1c - 10.7  UDS - negative  VTE prophylaxis - SCDs  aspirin 81 mg daily prior to admission, now on ASA 325mg  daily  Patient will be counseled to be compliant with her  antithrombotic medications  Ongoing aggressive stroke risk factor management  Therapy recommendations:  pending  Disposition:  Pending  COVID infection  Post vaccination  Husband has COVID, pt had exposure  [employer] Virus 2 - positive  CCM on board  On remdesevir, barictinib and decadron  Intubated on sedation  Septic shock  On cefepime, off vanco and flagyl  Now on neo  Off cleviprex  BP 80s-90s  CCM on board  Hypertension  Home BP meds: Norvasc ; atenolol ; chlorthalidone   Current BP meds: Cleviprex prn  Stable . Permissive hypertension (OK if < 220/120) but gradually normalize in 5-7 days  . Long-term BP goal normotensive  Hyperlipidemia  Home Lipid lowering medication: none   LDL 97.2, goal < 70  Current lipid lowering medication: lipitor 40   Continue statin at discharge  Diabetes  Home diabetic meds: insulin ; Januvia  Current diabetic meds: Insulin and Insulin drip  HgbA1c 10.7, goal < 7.0  CCM on board - managing the insulin drip  CBG monitoring  Close PCP follow up  Other Stroke Risk Factors  Obesity, Body mass index is 40.21 kg/m., recommend weight loss, diet and exercise as appropriate   Other Active Problems  Code status - Full code  Urine and blood cultures - pending  Contrast allergy   Anemia - Hgb - 10.6   Thrombocytopenia - platelets - 149  CKD - stage 3b - creatinine - 2.51  Hospital day # 1  This patient is critically ill due to large MCA infarct, s/p thrombectomy, COVID pneumonia, intubated and at significant risk of neurological worsening, death form respiratory failure, heart failure, ARDS, recurrent stroke, hemorrhagic conversion or seizure/status epilepticus. This patient's care requires constant monitoring of vital signs, hemodynamics, respiratory and cardiac monitoring, review of multiple databases, neurological assessment, discussion with family, other specialists and medical decision making of high  complexity. I spent 40 minutes of neurocritical care time in the care of this patient.  Rosalin Hawking, MD PhD Stroke Neurology 12/28/2019 11:38 PM   To contact Stroke Continuity provider, please refer to [email or web address] After hours, contact General Neurology

## 2019-12-28 NOTE — Progress Notes (Signed)
[facility] Progress Note Patient Name: Judy English DOB: 08-14-1960 MRN: [id number]   Date of Service  12/28/2019  HPI/Events of Note  Patient is supposed to be proned tonight but she is dropping her blood pressure on a Propofol infusion.  eICU Interventions  Propofol discontinued, Versed substituted due to less hemodynamic impact, proning suspended for tonight, Norepinephrine infusion ordered,  Arterial line ordered. Ground crew requested to come and insert a central line.        Kerry Kass Betzaida Cremeens 12/28/2019, 10:05 PM

## 2019-12-28 NOTE — Progress Notes (Signed)
PT Cancellation Note  Patient Details Name: Judy English MRN: [id number] DOB: 09/17/1960   Cancelled Treatment:    Reason Eval/Treat Not Completed: Patient not medically ready.  Still sedated and not sure if will be appropriate today.  Will check back later as time allows. 12/28/2019  Ginger Carne., PT Acute Rehabilitation Services [phone]  (pager) [phone]  (office)   [name] [name] 12/28/2019, 9:54 AM

## 2019-12-29 ENCOUNTER — Inpatient Hospital Stay (HOSPITAL_COMMUNITY): Payer: Medicaid Other

## 2019-12-29 DIAGNOSIS — I959 Hypotension, unspecified: Secondary | ICD-10-CM

## 2019-12-29 LAB — COMPREHENSIVE METABOLIC PANEL
ALT: 11 U/L (ref 0–44)
AST: 59 U/L — ABNORMAL HIGH (ref 15–41)
Albumin: 2.4 g/dL — ABNORMAL LOW (ref 3.5–5.0)
Alkaline Phosphatase: 53 U/L (ref 38–126)
Anion gap: 13 (ref 5–15)
BUN: 55 mg/dL — ABNORMAL HIGH (ref 6–20)
CO2: 14 mmol/L — ABNORMAL LOW (ref 22–32)
Calcium: 7.4 mg/dL — ABNORMAL LOW (ref 8.9–10.3)
Chloride: 110 mmol/L (ref 98–111)
Creatinine, Ser: 3.63 mg/dL — ABNORMAL HIGH (ref 0.44–1.00)
GFR calc Af Amer: 15 mL/min — ABNORMAL LOW (ref >60)
GFR calc non Af Amer: 13 mL/min — ABNORMAL LOW (ref >60)
Glucose, Bld: 259 mg/dL — ABNORMAL HIGH (ref 70–99)
Potassium: 5.9 mmol/L — ABNORMAL HIGH (ref 3.5–5.1)
Sodium: 137 mmol/L (ref 135–145)
Total Bilirubin: 0.5 mg/dL (ref 0.3–1.2)
Total Protein: 5.7 g/dL — ABNORMAL LOW (ref 6.5–8.1)

## 2019-12-29 LAB — POCT I-STAT 7, (LYTES, BLD GAS, ICA,H+H)
Acid-base deficit: 12 mmol/L — ABNORMAL HIGH (ref 0.0–2.0)
Bicarbonate: 17.3 mmol/L — ABNORMAL LOW (ref 20.0–28.0)
Calcium, Ion: 1.11 mmol/L — ABNORMAL LOW (ref 1.15–1.40)
HCT: 27 % — ABNORMAL LOW (ref 36.0–46.0)
Hemoglobin: 9.2 g/dL — ABNORMAL LOW (ref 12.0–15.0)
O2 Saturation: 63 %
Patient temperature: 99.2
Potassium: 5.5 mmol/L — ABNORMAL HIGH (ref 3.5–5.1)
Sodium: 140 mmol/L (ref 135–145)
TCO2: 19 mmol/L — ABNORMAL LOW (ref 22–32)
pCO2 arterial: 60.4 mmHg — ABNORMAL HIGH (ref 32.0–48.0)
pH, Arterial: 7.068 — CL (ref 7.350–7.450)
pO2, Arterial: 47 mmHg — ABNORMAL LOW (ref 83.0–108.0)

## 2019-12-29 LAB — GLUCOSE, CAPILLARY: Glucose-Capillary: 309 mg/dL — ABNORMAL HIGH (ref 70–99)

## 2019-12-29 LAB — CBC
HCT: 31.8 % — ABNORMAL LOW (ref 36.0–46.0)
Hemoglobin: 10.2 g/dL — ABNORMAL LOW (ref 12.0–15.0)
MCH: 27.9 pg (ref 26.0–34.0)
MCHC: 32.1 g/dL (ref 30.0–36.0)
MCV: 86.9 fL (ref 80.0–100.0)
Platelets: 89 10*3/uL — ABNORMAL LOW (ref 150–400)
RBC: 3.66 MIL/uL — ABNORMAL LOW (ref 3.87–5.11)
RDW: 12.8 % (ref 11.5–15.5)
WBC: 11.6 10*3/uL — ABNORMAL HIGH (ref 4.0–10.5)
nRBC: 0.5 % — ABNORMAL HIGH (ref 0.0–0.2)

## 2019-12-29 LAB — URINE CULTURE: Culture: NO GROWTH

## 2019-12-29 LAB — LACTIC ACID, PLASMA: Lactic Acid, Venous: 3.5 mmol/L (ref 0.5–1.9)

## 2019-12-29 MED ORDER — NOREPINEPHRINE 4 MG/250ML-% IV SOLN
INTRAVENOUS | Status: AC
  Filled 2019-12-29: qty 250

## 2019-12-29 MED ORDER — STERILE WATER FOR INJECTION IV SOLN
INTRAVENOUS | Status: DC
  Filled 2019-12-29: qty 850

## 2019-12-29 MED FILL — Medication: Qty: 1 | Status: AC

## 2019-12-31 ENCOUNTER — Encounter (HOSPITAL_COMMUNITY): Payer: Self-pay | Admitting: Radiology

## 2020-01-01 LAB — CULTURE, BLOOD (ROUTINE X 2)
Culture: NO GROWTH
Culture: NO GROWTH

## 2020-01-24 NOTE — Progress Notes (Signed)
Assisted family with tele-visit after patient had deceased. Large family was out in [employer] parking lot

## 2020-01-24 NOTE — Accreditation Note (Signed)
Restraints not reported to CMS Pursuant to regulation 482.13 (G) (3) use of soft wrist restraints was logged on 01/01/2020.

## 2020-01-24 NOTE — Significant Event (Signed)
Code Blue Event Note:   Over the evening of 9/4 Pt required 100% FiO2 and plan was to prone patient, her BP began to drop and peripheral Levophed was initiated and titrated up, so was called to the bedside to insert CVC.   Pressures 80/40's.  I called pt's son Legrand Como for consent for central line, after significant conversation, he refused CVC.  Levophed increased to 62mcg with MAP 60-65, so at that point was stable on peripheral dose.  Oxygen saturations 80-85%, PEEP increased from 14-16. Shortly thereafter, pt's blood pressure began to rapidly decline. Vasopressin and Epinephrin infusions were started and pt lost pulses.  Code blue was called and chest compressions started, Dr. Lucile Shutters present via camera and shortly after Dr. Duwayne Heck at the bedside.  An emergent femoral CVC placed, for exact timing and medication doses see code flow sheet.  After Epinephrine pushes, 2 amps bicarb and calcium 1mg  pt was in rapid Vtach, defibrillated x1 with ROSC.   She remained hypoxic with istat labs showing pH 7.0 without hypercarbia and pO2 60's without hypoglycemia or new anemia, K 5.9. EKG without STEMI.  Despite maximal vasopressors, became increasingly hypotensive and pulse lost on A-line again.  CPR resumed for several rounds, no pneumo or tamponade on bedside ultrasound, however had dilated RV.  Given she was hypoxic and tachycardic with apparent R-sided HF and not responding ACLS measures the decision was made to push lytics to treat possible PE.  Pulses were re-gained for a short time after this, however again became hypotensive and lost pulses, after multiple rounds of ACLS with bicarb, additional calcium and maximal Levophed, Epi, Vaso and bicarb gtt and actively coding >60 minutes the decision was made not to pursue further chest compressions by PCCM attending, myself and ICU nursing in agreement.  Pt expired at 60 with family at the bedside   Otilio Carpen Loris Winrow, PA-C

## 2020-01-24 NOTE — Procedures (Signed)
Central Venous Catheter Insertion Procedure Note  Charrisse Masley  [id number]  1960/05/07  Date:2020-01-08  Time:1:12 AM   Provider Performing:Tyrell Seifer R Sheridan Hew   Procedure: Insertion of Non-tunneled Central Venous Catheter(36556) with US guidance (44967)   Indication(s) Medication administration  Consent Unable to obtain consent due to emergent nature of procedure.  Anesthesia Topical only with 1% lidocaine   Timeout Verified patient identification, verified procedure, site/side was marked, verified correct patient position, special equipment/implants available, medications/allergies/relevant history reviewed, required imaging and test results available.  Sterile Technique Maximal sterile technique was not able to be achieved due to emergent nature of procedure.  Procedure Description Area of catheter insertion was cleaned with chlorhexidine and draped in sterile fashion.  With real-time ultrasound guidance a central venous catheter was placed into the left femoral vein. Nonpulsatile blood flow and easy flushing noted in all ports.  The catheter was sutured in place and sterile dressing applied.  Complications/Tolerance None; patient tolerated the procedure well. Chest X-ray is ordered to verify placement for internal jugular or subclavian cannulation.   Chest x-ray is not ordered for femoral cannulation.  EBL Minimal  Specimen(s) None  Otilio Carpen Reakwon Barren, PA-C

## 2020-01-24 NOTE — Death Summary Note (Addendum)
DEATH SUMMARY   Patient Details  Name: Judy English MRN: [id number] DOB: 01-24-1961  Admission/Discharge Information   Admit Date:  01/01/2020  Date of Death: Date of Death: Jan 03, 2020  Time of Death: Time of Death: 2037-07-20  Length of Stay: 2  Referring Physician: Trey Sailors, PA   Reason(s) for Hospitalization  ischemic stroke, covid-19  Diagnoses  Preliminary cause of death:  Secondary Diagnoses (including complications and co-morbidities):  Active Problems:   Stroke (cerebrum) (HCC)   Middle cerebral artery embolism, left   Respiratory failure with hypoxia (HCC)   Hypotension Shock, possible septic shock  Brief Hospital Course (including significant findings, care, treatment, and services provided and events leading to death)  Judy English is a 59 y.o. year old female with a hx of SLE, HTN, CAD, hx of CVA, Fibromyalgia, DM II, neuropathy, asthma, anxiety/depression and obesity.  Summary below is collected from the notes in the chart, as well as direct patient care during cardiac arrest event.    She was admitted January 01, 2023 in the am with Altered mental status and hypoxemia. She had R-sided weakness, decreased sensation on R and R-sided hemi-neglect, apparently with some degree of symptoms for 24-36 hours, though apparently had a normal phone conversation with son  Earlier in the day of admission.  She also endorsed 1 week of nausea/vomiting and diarrhea.  She had had a known covid exposure from her husband.  Recent outpatient covid test was negative.    She was confused and lethargic on admission, with and oxygen saturation of 80% on Room air, and per notes was on a NRB mask prior to her intubation.   Covid test on admission positive.    CTA of the head showed occlusion of L M1 MCA and large area of L MCA territory penumbra and smaller core infarct. Stenosis of B PCA.  GGO in lung parenchyma imaged by CT neck.   Intubated for IR procedure, underwent Left common  carotid arteriogram, revascularization of occluded L MCA M1.    She was initiated on Covid therapies including remdesivir, baricitinib, and decadron, also started Broad spectrum antibiotics for possible bacterial sepsis. .   On 9/4, unable to lower sedation medications due to agitation. Remained intubated for hypoxemic respiratory failure (100% FIo2 and PEEP 8).  RR in 30s throughout day.  Persistent lack of movement in R arm per neuro IR note. Stopped Vanc and flagyl continued cefepime.   Cleviprex had been started for blood pressure control given her recent stroke and thrombectomy.   Evening of 12/28/19, plan was  for proning due to hypoxemia, however was becoming increasingly hypotensive at around 7pm per review of vitals.  cleviprex stopped.  Transitioned from Propofol to versed.  Started norepinephrine peripherally.   Delay in central line placement due to decision maker hesitance.  Developed worsening hypoxia, worsening hypotension, then cardiac arrest.  Central line was placed by PA [facility] emergently.  See CODE note for details of the prolonged cardiac arrest.   PEA for duration of code.  Given Epi per ACLS algorhythm, as well as multiple amps of bicarb, calcium to treat acidosis and hyperkalemia  .Increased pressors (levophed, vasoproessin, neosynephrine during first episode of ROSC).  Patient responded with ROSC briefly after pushes of epi, increasing doses of pressors.  Gradually increased Epi, decreased phenylephrine (due to concern for PE).  Also concerned acidosis was a factor, so given 10mg  of vecuronium during period of ROSC for vent dyssynchrony to improve respiratory component of acidosis.  Initial echo done during episodes of ROSC, showed enlarged RV. RV:LV ratio >1.   Both RV and LV with reduced function.  Concern for PE given sudden change in bp and increased oxygen requirements.  Recent Hb stable, no evidence of bleeding . Treated with lytic for possible PE (tenecteplase  (TNKASE)).  Briefly improved, echo with appearance of improve RV size and function, still requiring very high doses of pressors, but then again developed cardiac arrest.    Discussed with family after >13minutes of cardiac arrest. Explained that further chest compressions would not improve outcome, and that we would continue to support her with high dose pressors, but would not continue to to do chest compressions.  Titrated up on pressor doses (levophed 80, neosynephrine 200), but she developed subsequent cardiac arrest despite our aggressive interventions.    Pertinent Labs and Studies  Significant Diagnostic Studies MR ANGIO HEAD WO CONTRAST  Result Date: 12/28/2019 CLINICAL DATA:  Stroke follow-up EXAM: MRI HEAD WITHOUT CONTRAST MRA HEAD WITHOUT CONTRAST TECHNIQUE: Multiplanar, multiecho pulse sequences of the brain and surrounding structures were obtained without intravenous contrast. Angiographic images of the head were obtained using MRA technique without contrast. COMPARISON:  CTA head neck 01/12/2020 FINDINGS: MRI HEAD FINDINGS Brain: Large acute infarct occupying most of the left MCA territory. Moderate cytotoxic edema. Multifocal hyperintense T2-weighted signal within the white matter. Normal volume of CSF spaces. No chronic microhemorrhage. Normal midline structures. 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 Vascular: Normal flow voids. Skull and upper cervical spine: Normal marrow signal. Sinuses/Orbits: Negative. Other: None. MRA HEAD FINDINGS POSTERIOR CIRCULATION: --Vertebral arteries: Normal V4 segments. --Inferior cerebellar arteries: Normal. --Basilar artery: Normal. --Superior cerebellar arteries: Normal. --Posterior cerebral arteries: Normal. ANTERIOR CIRCULATION: --Intracranial internal carotid arteries: Normal. --Anterior cerebral arteries (ACA): Normal. Both A1 segments are present. Patent anterior communicating artery (a-comm). --Middle cerebral arteries (MCA): Left MCA M1 segment is  occluded proximally. Right MCA is patent. No hemodynamically significant stenosis. IMPRESSION: 1. Large acute infarct occupying most of the left MCA territory. No hemorrhage or mass effect. 2. Left MCA M1 segment is occluded proximally. Electronically Signed   By: Ulyses Jarred M.D.   On: 12/28/2019 06:12   MR BRAIN WO CONTRAST  Result Date: 12/28/2019 CLINICAL DATA:  Stroke follow-up EXAM: MRI HEAD WITHOUT CONTRAST MRA HEAD WITHOUT CONTRAST TECHNIQUE: Multiplanar, multiecho pulse sequences of the brain and surrounding structures were obtained without intravenous contrast. Angiographic images of the head were obtained using MRA technique without contrast. COMPARISON:  CTA head neck 01/01/2020 FINDINGS: MRI HEAD FINDINGS Brain: Large acute infarct occupying most of the left MCA territory. Moderate cytotoxic edema. Multifocal hyperintense T2-weighted signal within the white matter. Normal volume of CSF spaces. No chronic microhemorrhage. Normal midline structures. 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 Vascular: Normal flow voids. Skull and upper cervical spine: Normal marrow signal. Sinuses/Orbits: Negative. Other: None. MRA HEAD FINDINGS POSTERIOR CIRCULATION: --Vertebral arteries: Normal V4 segments. --Inferior cerebellar arteries: Normal. --Basilar artery: Normal. --Superior cerebellar arteries: Normal. --Posterior cerebral arteries: Normal. ANTERIOR CIRCULATION: --Intracranial internal carotid arteries: Normal. --Anterior cerebral arteries (ACA): Normal. Both A1 segments are present. Patent anterior communicating artery (a-comm). --Middle cerebral arteries (MCA): Left MCA M1 segment is occluded proximally. Right MCA is patent. No hemodynamically significant stenosis. IMPRESSION: 1. Large acute infarct occupying most of the left MCA territory. No hemorrhage or mass effect. 2. Left MCA M1 segment is occluded proximally. Electronically Signed   By: Ulyses Jarred M.D.   On: 12/28/2019 06:12   CT CEREBRAL  PERFUSION W CONTRAST  Result Date: 01/10/2020 CLINICAL DATA:  Neuro deficit, acute, stroke suspected. EXAM: CT ANGIOGRAPHY HEAD AND NECK CT PERFUSION BRAIN TECHNIQUE: Multidetector CT imaging of the head and neck was performed using the standard protocol during bolus administration of intravenous contrast. Multiplanar CT image reconstructions and MIPs were obtained to evaluate the vascular anatomy. Carotid stenosis measurements (when applicable) are obtained utilizing NASCET criteria, using the distal internal carotid diameter as the denominator. Multiphase CT imaging of the brain was performed following IV bolus contrast injection. Subsequent parametric perfusion maps were calculated using RAPID software. CONTRAST:  166mL OMNIPAQUE IOHEXOL 350 MG/ML SOLN COMPARISON:  Same day head CT. FINDINGS: CTA NECK FINDINGS Aortic arch: Imaged portion shows no evidence of aneurysm or dissection. No significant stenosis of the major arch vessel origins. Right carotid system: No evidence of dissection, stenosis (50% or greater) or occlusion. Retropharyngeal course. Left carotid system: No evidence of dissection, stenosis (50% or greater) or occlusion. Vertebral arteries: Codominant. No evidence of dissection, stenosis (50% or greater) or occlusion. Skeleton: No acute osseous abnormality. Degenerative changes of the cervical spine with anterior osteophytes. Other neck: Borderline enlarged lymph nodes, nonspecific but likely reactive. Upper chest: Partially imaged extensive peripheral predominant ground-glass opacification with consolidation Review of the MIP images confirms the above findings CTA HEAD FINDINGS Anterior circulation: Occlusion of the left M1 MCA. Opacification of M2 branches likely from collateral flow. No aneurysm. Posterior circulation: There is moderate to advanced stenosis of the right P1 PCA and the right P2 PCA. Additionally, there is moderate to advanced stenosis of the left P2 PCA. No aneurysm. Venous  sinuses: As permitted by contrast timing, patent. Review of the MIP images confirms the above findings CT Brain Perfusion Findings: CBF (<30%) Volume: 40mL (infarct core). Located within the posterior aspect of the left MCA territory. Perfusion (Tmax>6.0s) volume: 158mL Mismatch Volume: 51mL (penumbra) IMPRESSION: 1. Occlusion of the left M1 MCA with a large area of left MCA territory penumbra (79 mL) and smaller core infarct. 2. Multifocal moderate to advanced stenosis of bilateral posterior cerebral arteries, as detailed above. 3. No significant (greater than 50%) stenosis in the neck. 4. Partially imaged extensive peripheral predominant ground-glass opacification with consolidation, compatible with multifocal pneumonia (likely COVID pneumonia). Code stroke imaging results were communicated on 01/18/2020 at 3:51 pm to provider Dr. Milas Gain via telephone, who verbally acknowledged these results. Electronically Signed   By: Margaretha Sheffield MD   On: 01/07/2020 16:07   DG CHEST PORT 1 VIEW  Result Date: 01/15/20 CLINICAL DATA:  Respiratory distress EXAM: PORTABLE CHEST 1 VIEW COMPARISON:  01/06/2020 FINDINGS: Endotracheal tube is 3 cm above the carina. NG tube enters the stomach. Patchy bilateral airspace opacities, most confluent in the right upper lobe, worsening since prior study. Mild cardiomegaly. No effusions or acute bony abnormality. IMPRESSION: Patchy bilateral airspace disease, most pronounced in the right upper lobe concerning for multifocal pneumonia. Electronically Signed   By: Rolm Baptise M.D.   On: 01-15-2020 00:26   DG CHEST PORT 1 VIEW  Result Date: 01/03/2020 CLINICAL DATA:  Orogastric tube placement, COVID pneumonia EXAM: PORTABLE CHEST 1 VIEW COMPARISON:  2:16 p.m. FINDINGS: Endotracheal tube is been placed 3.6 cm above the carina. Orogastric tube extends into the upper abdomen, beyond the margin of the examination. Extensive left mid and lower lung zone airspace infiltrate is again identified,  likely infectious or inflammatory. Cardiac size is mildly enlarged, stable when accounting for rotation on this examination. No pneumothorax or pleural effusion. IMPRESSION:  1. Endotracheal tube tip 3.6 cm above the carina. 2. Orogastric tube extends into the upper abdomen, beyond the margin of the examination. And Electronically Signed   By: Fidela Salisbury MD   On: 01/10/2020 22:52   DG Chest Port 1 View  Result Date: 01/07/2020 CLINICAL DATA:  Sepsis? EXAM: PORTABLE CHEST 1 VIEW COMPARISON:  Chest x-ray 05/20/2019, CT chest 12/06/2013 FINDINGS: Enlarged cardiomediastinal silhouette with some attribution to AP portable technique as well as low lung volumes. Bilateral patchy airspace opacities that are more prominent peripherally and within the lower lung zones. No pulmonary edema. No pleural effusion. No pneumothorax. No acute osseous finding. IMPRESSION: Pulmonary findings suspicious for COVID-19. Enlarged cardiac silhouette likely due to technique and low lung volumes. Electronically Signed   By: Iven Finn M.D.   On: 01/11/2020 14:41   CT HEAD CODE STROKE WO CONTRAST  Result Date: 01/13/2020 CLINICAL DATA:  Code stroke. EXAM: CT HEAD WITHOUT CONTRAST TECHNIQUE: Contiguous axial images were obtained from the base of the skull through the vertex without intravenous contrast. COMPARISON:  MRI July 10, 2008 FINDINGS: Brain: No evidence of acute large vascular territory infarct. Small remote right cerebellar lacunar infarct. Apparent hypodensity in the left cerebellum is favored to relate to streak artifact. No convincing evidence of acute large vascular territory infarct. Patchy white matter hypodensity is nonspecific but likely relates to chronic microvascular ischemic disease. No acute hemorrhage. No hydrocephalus. No mass lesion or abnormal mass effect. Vascular: No hyperdense vessel identified. Intracranial atherosclerosis. Skull: Normal. Negative for fracture or focal lesion. Sinuses/Orbits: Chronic  changes in the inferior right maxillary sinus with mineralization. Otherwise, the sinuses are clear. Negative orbits. ASPECTS ([place] Stroke Program Early CT Score): 10 IMPRESSION: 1. No evidence of acute intracranial abnormality. 2. Presumed chronic microvascular ischemic disease and small remote right cerebellar lacunar infarct. 3. ASPECTS is 10 Code stroke imaging results were communicated on 01/04/2020 at 2:56 pm to provider Dr. Milas Gain Via telephone, who verbally acknowledged these results. Electronically Signed   By: Margaretha Sheffield MD   On: 01/01/2020 15:02   ECHOCARDIOGRAM LIMITED  Result Date: 12/28/2019    ECHOCARDIOGRAM REPORT   Patient Name:   [facility] Date of Exam: 12/28/2019 Medical Rec #:  [id number]            Height:       69.0 in Accession #:    [id number]           Weight:       272.3 lb Date of Birth:  [birth year]             BSA:          2.355 m Patient Age:    89 years             BP:           121/73 mmHg Patient Gender: F                    HR:           73 bpm. Exam Location:  Inpatient Procedure: Limited Echo, Cardiac Doppler and Color Doppler Indications:    Stroke  History:        Patient has no prior history of Echocardiogram examinations.                 Risk Factors:Diabetes, Sleep Apnea, Hypertension and                 Dyslipidemia. COVID-19.  Sonographer:    Clayton Lefort RDCS (AE) Referring Phys: [place] Comments: Patient is morbidly obese. COVID-19 IMPRESSIONS  1. Left ventricular ejection fraction, by estimation, is 60 to 65%. The left ventricle has normal function. The left ventricle has no regional wall motion abnormalities. There is severe asymmetric left ventricular hypertrophy of the basal-septal segment. Left ventricular diastolic parameters are indeterminate.  2. Right ventricular systolic function is normal. The right ventricular size is mildly enlarged.  3. The mitral valve is normal in structure. No evidence of mitral valve regurgitation.   4. The aortic valve was not well visualized. Aortic valve regurgitation is not visualized. No aortic stenosis is present. FINDINGS  Left Ventricle: Left ventricular ejection fraction, by estimation, is 60 to 65%. The left ventricle has normal function. The left ventricle has no regional wall motion abnormalities. The left ventricular internal cavity size was normal in size. There is  severe asymmetric left ventricular hypertrophy of the basal-septal segment. Left ventricular diastolic parameters are indeterminate. Right Ventricle: The right ventricular size is mildly enlarged. Right vetricular wall thickness was not assessed. Right ventricular systolic function is normal. Left Atrium: Left atrial size was not well visualized. Right Atrium: Right atrial size was not well visualized. Pericardium: There is no evidence of pericardial effusion. Mitral Valve: The mitral valve is normal in structure. No evidence of mitral valve regurgitation. Tricuspid Valve: The tricuspid valve is normal in structure. Tricuspid valve regurgitation is trivial. Aortic Valve: The aortic valve was not well visualized. Aortic valve regurgitation is not visualized. No aortic stenosis is present. Pulmonic Valve: The pulmonic valve was not well visualized. Pulmonic valve regurgitation is not visualized. Aorta: The aortic root and ascending aorta are structurally normal, with no evidence of dilitation. Venous: IVC assessment for right atrial pressure unable to be performed due to mechanical ventilation. IAS/Shunts: The interatrial septum was not well visualized.  LEFT VENTRICLE PLAX 2D LVIDd:         4.00 cm  Diastology LVIDs:         2.60 cm  LV e' lateral:   6.09 cm/s LV PW:         1.40 cm  LV E/e' lateral: 12.8 LV IVS:        1.70 cm  LV e' medial:    6.74 cm/s LVOT diam:     2.20 cm  LV E/e' medial:  11.6 LV SV:         62 LV SV Index:   26 LVOT Area:     3.80 cm  IVC IVC diam: 2.50 cm LEFT ATRIUM         Index LA diam:    3.10 cm 1.32 cm/m   AORTIC VALVE LVOT Vmax:   88.10 cm/s LVOT Vmean:  54.500 cm/s LVOT VTI:    0.164 m  AORTA Ao Root diam: 2.90 cm Ao Asc diam:  3.20 cm MITRAL VALVE               TRICUSPID VALVE MV Area (PHT): 2.95 cm    TR Peak grad:   28.5 mmHg MV Decel Time: 257 msec    TR Vmax:        267.00 cm/s MV E velocity: 78.00 cm/s MV A velocity: 64.70 cm/s  SHUNTS MV E/A ratio:  1.21        Systemic VTI:  0.16 m  Systemic Diam: 2.20 cm Oswaldo Milian MD Electronically signed by Oswaldo Milian MD Signature Date/Time: 12/28/2019/6:13:11 PM    Final    CT ANGIO HEAD CODE STROKE  Result Date: 01/17/2020 CLINICAL DATA:  Neuro deficit, acute, stroke suspected. EXAM: CT ANGIOGRAPHY HEAD AND NECK CT PERFUSION BRAIN TECHNIQUE: Multidetector CT imaging of the head and neck was performed using the standard protocol during bolus administration of intravenous contrast. Multiplanar CT image reconstructions and MIPs were obtained to evaluate the vascular anatomy. Carotid stenosis measurements (when applicable) are obtained utilizing NASCET criteria, using the distal internal carotid diameter as the denominator. Multiphase CT imaging of the brain was performed following IV bolus contrast injection. Subsequent parametric perfusion maps were calculated using RAPID software. CONTRAST:  140mL OMNIPAQUE IOHEXOL 350 MG/ML SOLN COMPARISON:  Same day head CT. FINDINGS: CTA NECK FINDINGS Aortic arch: Imaged portion shows no evidence of aneurysm or dissection. No significant stenosis of the major arch vessel origins. Right carotid system: No evidence of dissection, stenosis (50% or greater) or occlusion. Retropharyngeal course. Left carotid system: No evidence of dissection, stenosis (50% or greater) or occlusion. Vertebral arteries: Codominant. No evidence of dissection, stenosis (50% or greater) or occlusion. Skeleton: No acute osseous abnormality. Degenerative changes of the cervical spine with anterior osteophytes.  Other neck: Borderline enlarged lymph nodes, nonspecific but likely reactive. Upper chest: Partially imaged extensive peripheral predominant ground-glass opacification with consolidation Review of the MIP images confirms the above findings CTA HEAD FINDINGS Anterior circulation: Occlusion of the left M1 MCA. Opacification of M2 branches likely from collateral flow. No aneurysm. Posterior circulation: There is moderate to advanced stenosis of the right P1 PCA and the right P2 PCA. Additionally, there is moderate to advanced stenosis of the left P2 PCA. No aneurysm. Venous sinuses: As permitted by contrast timing, patent. Review of the MIP images confirms the above findings CT Brain Perfusion Findings: CBF (<30%) Volume: 66mL (infarct core). Located within the posterior aspect of the left MCA territory. Perfusion (Tmax>6.0s) volume: 147mL Mismatch Volume: 2mL (penumbra) IMPRESSION: 1. Occlusion of the left M1 MCA with a large area of left MCA territory penumbra (79 mL) and smaller core infarct. 2. Multifocal moderate to advanced stenosis of bilateral posterior cerebral arteries, as detailed above. 3. No significant (greater than 50%) stenosis in the neck. 4. Partially imaged extensive peripheral predominant ground-glass opacification with consolidation, compatible with multifocal pneumonia (likely COVID pneumonia). Code stroke imaging results were communicated on 01/05/2020 at 3:51 pm to provider Dr. Milas Gain via telephone, who verbally acknowledged these results. Electronically Signed   By: Margaretha Sheffield MD   On: 01/07/2020 16:07   CT ANGIO NECK CODE STROKE  Result Date: 01/01/2020 CLINICAL DATA:  Neuro deficit, acute, stroke suspected. EXAM: CT ANGIOGRAPHY HEAD AND NECK CT PERFUSION BRAIN TECHNIQUE: Multidetector CT imaging of the head and neck was performed using the standard protocol during bolus administration of intravenous contrast. Multiplanar CT image reconstructions and MIPs were obtained to evaluate the  vascular anatomy. Carotid stenosis measurements (when applicable) are obtained utilizing NASCET criteria, using the distal internal carotid diameter as the denominator. Multiphase CT imaging of the brain was performed following IV bolus contrast injection. Subsequent parametric perfusion maps were calculated using RAPID software. CONTRAST:  175mL OMNIPAQUE IOHEXOL 350 MG/ML SOLN COMPARISON:  Same day head CT. FINDINGS: CTA NECK FINDINGS Aortic arch: Imaged portion shows no evidence of aneurysm or dissection. No significant stenosis of the major arch vessel origins. Right carotid system: No evidence of dissection, stenosis (50% or greater)  or occlusion. Retropharyngeal course. Left carotid system: No evidence of dissection, stenosis (50% or greater) or occlusion. Vertebral arteries: Codominant. No evidence of dissection, stenosis (50% or greater) or occlusion. Skeleton: No acute osseous abnormality. Degenerative changes of the cervical spine with anterior osteophytes. Other neck: Borderline enlarged lymph nodes, nonspecific but likely reactive. Upper chest: Partially imaged extensive peripheral predominant ground-glass opacification with consolidation Review of the MIP images confirms the above findings CTA HEAD FINDINGS Anterior circulation: Occlusion of the left M1 MCA. Opacification of M2 branches likely from collateral flow. No aneurysm. Posterior circulation: There is moderate to advanced stenosis of the right P1 PCA and the right P2 PCA. Additionally, there is moderate to advanced stenosis of the left P2 PCA. No aneurysm. Venous sinuses: As permitted by contrast timing, patent. Review of the MIP images confirms the above findings CT Brain Perfusion Findings: CBF (<30%) Volume: 75mL (infarct core). Located within the posterior aspect of the left MCA territory. Perfusion (Tmax>6.0s) volume: 180mL Mismatch Volume: 37mL (penumbra) IMPRESSION: 1. Occlusion of the left M1 MCA with a large area of left MCA territory  penumbra (79 mL) and smaller core infarct. 2. Multifocal moderate to advanced stenosis of bilateral posterior cerebral arteries, as detailed above. 3. No significant (greater than 50%) stenosis in the neck. 4. Partially imaged extensive peripheral predominant ground-glass opacification with consolidation, compatible with multifocal pneumonia (likely COVID pneumonia). Code stroke imaging results were communicated on 12/26/2019 at 3:51 pm to provider Dr. Milas Gain via telephone, who verbally acknowledged these results. Electronically Signed   By: Margaretha Sheffield MD   On: 01/15/2020 16:07    Microbiology Recent Results (from the past 240 hour(s))  MRSA PCR Screening     Status: None   Collection Time: 01/07/2020  2:36 AM   Specimen: Nasopharyngeal  Result Value Ref Range Status   MRSA by PCR NEGATIVE NEGATIVE Final    Comment:        The GeneXpert MRSA Assay (FDA approved for NASAL specimens only), is one component of a comprehensive MRSA colonization surveillance program. It is not intended to diagnose MRSA infection nor to guide or monitor treatment for MRSA infections. Performed at [facility], [place] [address] [place], [place] [address]   Blood culture (routine x 2)     Status: None (Preliminary result)   Collection Time: 12/28/2019  3:00 PM   Specimen: BLOOD LEFT WRIST  Result Value Ref Range Status   Specimen Description BLOOD LEFT WRIST  Final   Special Requests   Final    BOTTLES DRAWN AEROBIC AND ANAEROBIC Blood Culture results may not be optimal due to an inadequate volume of blood received in culture bottles   Culture   Final    NO GROWTH 2 DAYS Performed at [facility], [place] [address] [place],  [address]    Report Status PENDING  Incomplete  Blood culture (routine x 2)     Status: None (Preliminary result)   Collection Time: 01/15/2020  3:04 PM   Specimen: BLOOD  Result Value Ref Range Status   Specimen Description BLOOD RIGHT ANTECUBITAL  Final   Special  Requests   Final    BOTTLES DRAWN AEROBIC ONLY Blood Culture results may not be optimal due to an inadequate volume of blood received in culture bottles   Culture   Final    NO GROWTH 2 DAYS Performed at [facility], [place] [address] [place],  [address]    Report Status PENDING  Incomplete  SARS Coronavirus 2 by RT PCR (hospital order, performed in [facility] hospital lab) Nasopharyngeal Nasopharyngeal Swab     Status: Abnormal   Collection Time: 01/18/2020  3:52 PM   Specimen: Nasopharyngeal Swab  Result Value Ref Range Status   SARS Coronavirus 2 POSITIVE (A) NEGATIVE Final    Comment: RESULT CALLED TO, READ BACK BY AND VERIFIED WITH: Cindy Hazy RN 01/14/2020 AT 1735 (NOTE) SARS-CoV-2 target nucleic acids are DETECTED  SARS-CoV-2 RNA is generally detectable in upper respiratory specimens  during the acute phase of infection.  Positive results are indicative  of the presence of the identified virus, but do not rule out bacterial infection or co-infection with other pathogens not detected by the test.  Clinical correlation with patient history and  other diagnostic information is necessary to determine patient infection status.  The expected result is negative.  Fact Sheet for Patients:   StrictlyIdeas.no   Fact Sheet for Healthcare Providers:   BankingDealers.co.za    This test is not yet approved or cleared by the [place] FDA and  has been authorized for detection and/or diagnosis of SARS-CoV-2 by FDA under an Emergency Use Authorization (EUA).  This EUA will remain in effect (meaning this test ca n be used) for the duration of  the COVID-19 declaration under Section 564(b)(1) of the Act, 21 U.S.C. section 360-bbb-3(b)(1), unless the authorization is terminated or revoked sooner.  Performed at [facility], [place] [address] [place], [place] [address]     Lab Basic Metabolic Panel: Recent Labs  Lab  01/06/2020 1352 12/31/2019 1352 01/09/2020 1502 12/30/2019 1502 12/28/19 0655 12/28/19 0655 12/28/19 1239 12/28/19 1258 12/28/19 1528 12/28/19 2028 12/28/19 2336  NA 133*   < > 136   < > 135   < > 142 141 140 139 137  K 4.9   < > 5.0   < > 4.7   < > 4.0 4.1 4.5 5.0 5.9*  CL 104  --  109  --  108  --   --   --   --   --  110  CO2 17*  --   --   --  14*  --   --   --   --   --  14*  GLUCOSE 354*  --  330*  --  378*  --   --   --   --   --  259*  BUN 41*  --  42*  --  44*  --   --   --   --   --  55*  CREATININE 1.98*  --  1.90*  --  2.51*  --   --   --   --   --  3.63*  CALCIUM 8.3*  --   --   --  7.6*  --   --   --   --   --  7.4*   < > = values in this interval not displayed.   Liver Function Tests: Recent Labs  Lab 01/03/2020 1352 12/28/19 2336  AST 63* 59*  ALT 20 11  ALKPHOS 54 53  BILITOT 0.7 0.5  PROT 7.3 5.7*  ALBUMIN 3.0* 2.4*   No results for input(s): LIPASE, AMYLASE in the last 168 hours. No results for input(s): AMMONIA in the last 168 hours. CBC: Recent Labs  Lab 01/02/2020 1352 12/30/2019 1502 12/28/19 0655 12/28/19 0655 12/28/19 1239 12/28/19 1258 12/28/19 1528 12/28/19 2028 12/28/19 2336  WBC 5.2  --  3.9*  --   --   --   --   --  11.6*  NEUTROABS 4.5  --  3.4  --   --   --   --   --   --   HGB 12.5   < > 10.6*   < > 9.9* 10.5* 9.9* 9.9* 10.2*  HCT 37.8   < > 32.5*   < > 29.0* 31.0* 29.0* 29.0* 31.8*  MCV 81.8  --  85.3  --   --   --   --   --  86.9  PLT 138*  --  149*  --   --   --   --   --  89*   < > = values in this interval not displayed.   Cardiac Enzymes: No results for input(s): CKTOTAL, CKMB, CKMBINDEX, TROPONINI in the last 168 hours. Sepsis Labs: Recent Labs  Lab 01/21/2020 1352 01/04/2020 1445 12/28/19 0655 12/28/19 1251 12/28/19 2336  PROCALCITON  --   --   --  <0.10  --   WBC 5.2  --  3.9*  --  11.6*  LATICACIDVEN  --  2.0*  --   --  3.5*    Procedures/Operations  Thrombectomy,  Intubation Arterial line Central line   Collier Bullock 01/06/20, 8:41 AM

## 2020-01-24 NOTE — Anesthesia Postprocedure Evaluation (Signed)
Anesthesia Post Note  Patient: Judy English  Procedure(s) Performed: IR WITH ANESTHESIA (N/A )     Patient location during evaluation: SICU Anesthesia Type: General Level of consciousness: sedated Pain management: pain level controlled Vital Signs Assessment: post-procedure vital signs reviewed and stable Respiratory status: patient remains intubated per anesthesia plan Cardiovascular status: stable Postop Assessment: no apparent nausea or vomiting Anesthetic complications: no   No complications documented.  Last Vitals:  Vitals:   12/28/19 2300 01/13/2020 0000  BP: (!) 88/71 (!) 89/49  Pulse:    Resp: 15 17  Temp:    SpO2:      Last Pain:  Vitals:   12/28/19 1941  TempSrc: Oral  PainSc:                  [place] S

## 2020-01-24 NOTE — Progress Notes (Signed)
Phone calls to sons Judy English and Judy English made, Judy English was close if not in the parking lot  due to [facility] had called him already. Family was being called due to severe nature of illness with active CPR in progress

## 2020-01-24 NOTE — Progress Notes (Signed)
Patients blood pressure became unstable. Cleviprex titrated off. Levophed started per MD order. CCM PA called to bedside for CVC insertion. However, family refused consent for procedure. Maxed out on Levophed peripherally. RN also spoke to son and answered questions as able to. CCM PA then had an additional discussion with son. Patients blood pressure rapidly declined, heart rate decreased and oxygen saturations decreased. Patient lost pulse. Code blue called. See code blue sheet. Son at bedside. Chaplain at bedside to provide support. Time of death called by CCM MD at 0039. Patient belongings given to son. Family was aggressive towards staff. Security called to unit.   Antonieta Pert, RN  2020-01-22 0100

## 2020-01-24 DEATH — deceased

## 2020-03-06 ENCOUNTER — Ambulatory Visit: Payer: Medicaid Other | Admitting: Podiatry
